# Patient Record
Sex: Female | Born: 1985 | Race: Black or African American | Hispanic: No | Marital: Single | Smoking: Current every day smoker
Health system: Southern US, Community
[De-identification: ages and names within clinical notes are randomized; demographics above are authoritative.]

## PROBLEM LIST (undated history)

## (undated) DIAGNOSIS — I10 Essential (primary) hypertension: Secondary | ICD-10-CM

## (undated) DIAGNOSIS — Z87820 Personal history of traumatic brain injury: Secondary | ICD-10-CM

## (undated) DIAGNOSIS — F32A Depression, unspecified: Secondary | ICD-10-CM

## (undated) DIAGNOSIS — F1420 Cocaine dependence, uncomplicated: Secondary | ICD-10-CM

## (undated) DIAGNOSIS — F1220 Cannabis dependence, uncomplicated: Secondary | ICD-10-CM

## (undated) DIAGNOSIS — F329 Major depressive disorder, single episode, unspecified: Secondary | ICD-10-CM

## (undated) DIAGNOSIS — F1010 Alcohol abuse, uncomplicated: Secondary | ICD-10-CM

## (undated) DIAGNOSIS — A749 Chlamydial infection, unspecified: Secondary | ICD-10-CM

---
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
## 2004-11-27 ENCOUNTER — Inpatient Hospital Stay (HOSPITAL_COMMUNITY): Admission: AD | Admit: 2004-11-27 | Discharge: 2004-11-27 | Payer: Self-pay | Admitting: *Deleted

## 2004-12-12 ENCOUNTER — Ambulatory Visit (HOSPITAL_COMMUNITY): Admission: RE | Admit: 2004-12-12 | Discharge: 2004-12-12 | Payer: Self-pay | Admitting: Family Medicine

## 2004-12-12 ENCOUNTER — Ambulatory Visit: Payer: Self-pay | Admitting: Family Medicine

## 2004-12-19 ENCOUNTER — Ambulatory Visit: Payer: Self-pay | Admitting: Family Medicine

## 2004-12-19 ENCOUNTER — Other Ambulatory Visit: Admission: RE | Admit: 2004-12-19 | Discharge: 2004-12-19 | Payer: Self-pay | Admitting: Family Medicine

## 2005-02-08 ENCOUNTER — Ambulatory Visit: Payer: Self-pay | Admitting: Obstetrics and Gynecology

## 2005-02-12 ENCOUNTER — Ambulatory Visit (HOSPITAL_COMMUNITY): Admission: RE | Admit: 2005-02-12 | Discharge: 2005-02-12 | Payer: Self-pay | Admitting: *Deleted

## 2005-02-22 ENCOUNTER — Ambulatory Visit: Payer: Self-pay | Admitting: Family Medicine

## 2005-06-04 HISTORY — PX: WISDOM TOOTH EXTRACTION: SHX21

## 2007-06-06 ENCOUNTER — Emergency Department (HOSPITAL_COMMUNITY): Admission: EM | Admit: 2007-06-06 | Discharge: 2007-06-06 | Payer: Self-pay | Admitting: Emergency Medicine

## 2008-02-10 ENCOUNTER — Emergency Department (HOSPITAL_COMMUNITY): Admission: EM | Admit: 2008-02-10 | Discharge: 2008-02-10 | Payer: Self-pay | Admitting: Emergency Medicine

## 2009-02-23 ENCOUNTER — Emergency Department (HOSPITAL_COMMUNITY): Admission: EM | Admit: 2009-02-23 | Discharge: 2009-02-23 | Payer: Self-pay | Admitting: Emergency Medicine

## 2010-01-17 ENCOUNTER — Emergency Department (HOSPITAL_COMMUNITY): Admission: EM | Admit: 2010-01-17 | Discharge: 2010-01-17 | Payer: Self-pay | Admitting: Emergency Medicine

## 2010-05-09 ENCOUNTER — Emergency Department (HOSPITAL_COMMUNITY)
Admission: EM | Admit: 2010-05-09 | Discharge: 2010-05-09 | Payer: Self-pay | Source: Home / Self Care | Admitting: Emergency Medicine

## 2010-05-14 ENCOUNTER — Emergency Department (HOSPITAL_COMMUNITY)
Admission: EM | Admit: 2010-05-14 | Discharge: 2010-05-14 | Payer: Self-pay | Source: Home / Self Care | Admitting: Emergency Medicine

## 2010-05-15 ENCOUNTER — Observation Stay (HOSPITAL_COMMUNITY)
Admission: EM | Admit: 2010-05-15 | Discharge: 2010-05-15 | Payer: Self-pay | Source: Home / Self Care | Admitting: Emergency Medicine

## 2010-06-24 ENCOUNTER — Encounter: Payer: Self-pay | Admitting: *Deleted

## 2010-08-15 LAB — URINALYSIS, ROUTINE W REFLEX MICROSCOPIC
Glucose, UA: NEGATIVE mg/dL
Ketones, ur: NEGATIVE mg/dL
Nitrite: POSITIVE — AB
Protein, ur: 100 mg/dL — AB
Specific Gravity, Urine: 1.009 (ref 1.005–1.030)
pH: 6.5 (ref 5.0–8.0)

## 2010-08-15 LAB — COMPREHENSIVE METABOLIC PANEL
ALT: 11 U/L (ref 0–35)
Albumin: 3.5 g/dL (ref 3.5–5.2)
CO2: 22 mEq/L (ref 19–32)
Chloride: 104 mEq/L (ref 96–112)
GFR calc Af Amer: >60 mL/min (ref >60)
GFR calc non Af Amer: >60 mL/min (ref >60)
Total Bilirubin: 1.4 mg/dL — ABNORMAL HIGH (ref 0.3–1.2)
Total Protein: 6.6 g/dL (ref 6.0–8.3)

## 2010-08-15 LAB — DIFFERENTIAL
Basophils Absolute: 0 10*3/uL (ref 0.0–0.1)
Eosinophils Absolute: 0 10*3/uL (ref 0.0–0.7)
Lymphocytes Relative: 12 % (ref 12–46)
Lymphs Abs: 1.1 10*3/uL (ref 0.7–4.0)
Monocytes Absolute: 1 10*3/uL (ref 0.1–1.0)
Neutro Abs: 7 10*3/uL (ref 1.7–7.7)

## 2010-08-15 LAB — WET PREP, GENITAL: Yeast Wet Prep HPF POC: NONE SEEN

## 2010-08-15 LAB — CBC
MCV: 94.4 fL (ref 78.0–100.0)
RBC: 4.25 MIL/uL (ref 3.87–5.11)
WBC: 9.2 10*3/uL (ref 4.0–10.5)

## 2010-08-15 LAB — GC/CHLAMYDIA PROBE AMP, GENITAL
Chlamydia, DNA Probe: NEGATIVE
GC Probe Amp, Genital: NEGATIVE

## 2010-08-15 LAB — URINE MICROSCOPIC-ADD ON

## 2010-08-18 LAB — GC/CHLAMYDIA PROBE AMP, GENITAL: Chlamydia, DNA Probe: NEGATIVE

## 2010-08-18 LAB — WET PREP, GENITAL: Trich, Wet Prep: NONE SEEN

## 2010-09-08 LAB — POCT PREGNANCY, URINE: Preg Test, Ur: NEGATIVE

## 2010-10-20 NOTE — Group Therapy Note (Signed)
NAME:  Tiffany [name], Tiffany [name] NO.:  [id number]   MEDICAL RECORD NO.:  [id number]          PATIENT TYPE:  WOC   LOCATION:  [facility]                   FACILITY:  [facility]   PHYSICIAN:  Tinnie Gens, MD        DATE OF BIRTH:  [birth year]   DATE OF SERVICE:  02/22/2005                                    CLINIC NOTE   CHIEF COMPLAINT:  Discussed options __________ .   HISTORY OF PRESENT ILLNESS:  The patient is a 25 year old, Gravida 1, Para 1  who was first seen at the end of this month for abnormal bleeding on Depo-  Provera.  Since that time has been started on a Nuvo ring.  Her bleeding has  much improved. The patient has had a previous ultrasound that showed a  bilateral ovarian cyst.  We had a followup with that was done on September  11.  The patient's ultrasound showed normal uterus and endometrium.  There  was persistent area of focal increased echogenicity in the right ovary,  compatible with a small dermoid that was unchanged since her previous  ultrasound dated July 11.  The previous dominant cyst of the right ovary had  resolved and a new unilocular cyst on the left was noted consistent with a  hemorrhagic cyst and further followup was felt to be unnecessary.   PHYSICAL EXAMINATION:  VITAL SIGNS:  On examine, today, her vitals were in  the chart.  GENERAL:  She is a well-developed, well-nourished white female in no acute  distress.  ABDOMEN:  Soft, and nontender, nondistended.   IMPRESSION:  1.  Abnormal bleeding, previously on Depo and treated with Nuvo ring.  The      patient will continue this method of birth control for the next 2      months.  2.  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 It is felt that this would be very difficult      surgery to try to attempt and try to locate the exact location of this      dermoid to remove.  The patient has complaints only at the time of her      cycle  which is unlikely related to the cyst.   PLAN:  Will followup pelvic ultrasound for reevaluation of the cyst in  approximately 3 months; and the patient will followup here after that time.  She will be evaluated for the cyst, her pain, as well as the Nuvo ring at  that visit.           ______________________________  Tinnie Gens, MD     TP/MEDQ  D:  02/22/2005  T:  02/26/2005  Job:  [id number]   cc:   [facility]  [address]  [place]   [place]

## 2010-10-20 NOTE — Group Therapy Note (Signed)
NAME:  Tiffany [name], ORLOV NO.:  [id number]   MEDICAL RECORD NO.:  [id number]          PATIENT TYPE:  WOC   LOCATION:  [facility]                   FACILITY:  [facility]   PHYSICIAN:  Tinnie Gens, MD        DATE OF BIRTH:  01-18-1986   DATE OF SERVICE:  02/08/2005                                    CLINIC NOTE   CHIEF COMPLAINT:  Birth control consult.   HISTORY OF PRESENT ILLNESS:  The patient is a 25 year old gravida 1 para 1  who is sent from [facility]. She apparently was seen there with some  abnormal uterine bleeding related to Depo-Provera use. She has been on Depo-  Provera since for the last 5 years and states she has had abnormal uterine  bleeding since she started on it. She has continued on it because she likes  being on the Depo-Provera; however, they have not given her any from  [facility] since January 2006. She does report a normal cycle for her in  August 2006. She does have some heavy cramping as well as some pain in her  leg when she gets her menstrual flow.   PAST MEDICAL HISTORY:  Negative.   PAST SURGICAL HISTORY:  Negative.   MEDICATIONS:  None.   ALLERGIES:  None known.   OBSTETRICAL HISTORY:  G1 P1, one vaginal delivery.   FAMILY HISTORY:  Diabetes and hypertension.   SOCIAL HISTORY:  She lives with her child, her mom, and herself. She does  smoke one-and-a-half packs per day. She denies IV drug use and no alcohol  use.   REVIEW OF SYSTEMS:  A 14-point review of systems reviewed; please see GYN  history in the chart, but is diffusely positive. However, most of the  problems she is having are followed by [facility]. She does have some  vaginal odor, vaginal itching, vaginal bleeding, pain with intercourse -  most of that was related to her Depo use.   PHYSICAL EXAMINATION TODAY:  VITAL SIGNS:  As noted in the chart.  GENERAL:  She is a thin black female in no acute distress.  ABDOMEN:  Soft, nontender, nondistended.  GENITOURINARY:   Normal external female genitalia. The vagina is pink and  rugated. The cervix is parous and without lesion. The uterus is small,  anteverted. There is no adnexal mass or tenderness.   IMPRESSION:  Abnormal uterine bleeding related to Depo-Provera use. This is  not uncommon, as there is pretty much an unopposed progesterone effect and  no estrogen to pump up the endometrial lining. A good treatment for this  would be an estrogen patch or the pill. However, the patient does not need  to be on two consecutive methods of birth control. Discussed multiple  alternatives with this patient, including the pill, the patch, the ring,  IUD, and continued Depo use, as well as continued condom use. The patient  understands all this and she opts to receive a sample of the NuvaRing. The  patient is given the NuvaRing today in a sample and a discussion was had  with her regarding Medicaid family planning waiver which will  cover her Pap  smear as well as her birth control. The patient will find out more  information about this. The patient instructed to start the ring after she  starts her next cycle. There is no significant risk to a smoker using  hormonal contraceptives at the age of 27.   The patient will have a follow up pelvic ultrasound because she had a right  ovarian cyst that needed follow-up from December 12, 2004.           ______________________________  Tinnie Gens, MD     TP/MEDQ  D:  02/08/2005  T:  02/09/2005  Job:  [id number]

## 2010-11-21 ENCOUNTER — Emergency Department (HOSPITAL_COMMUNITY)
Admission: EM | Admit: 2010-11-21 | Discharge: 2010-11-22 | Disposition: A | Discharge status: Other Acute Inpatient Hospital | Payer: Medicaid Other | Source: Home / Self Care | Attending: Emergency Medicine | Admitting: Emergency Medicine

## 2010-11-21 DIAGNOSIS — F329 Major depressive disorder, single episode, unspecified: Secondary | ICD-10-CM | POA: Insufficient documentation

## 2010-11-21 DIAGNOSIS — R45851 Suicidal ideations: Secondary | ICD-10-CM | POA: Insufficient documentation

## 2010-11-21 DIAGNOSIS — F39 Unspecified mood [affective] disorder: Secondary | ICD-10-CM

## 2010-11-21 DIAGNOSIS — F3289 Other specified depressive episodes: Secondary | ICD-10-CM | POA: Insufficient documentation

## 2010-11-21 LAB — DIFFERENTIAL
Basophils Relative: 1 % (ref 0–1)
Eosinophils Absolute: 0.1 10*3/uL (ref 0.0–0.7)
Eosinophils Relative: 2 % (ref 0–5)
Lymphocytes Relative: 44 % (ref 12–46)
Monocytes Relative: 9 % (ref 3–12)
Neutro Abs: 2.9 10*3/uL (ref 1.7–7.7)

## 2010-11-21 LAB — RAPID URINE DRUG SCREEN, HOSP PERFORMED
Amphetamines: NOT DETECTED
Barbiturates: NOT DETECTED
Cocaine: POSITIVE — AB
Opiates: NOT DETECTED
Tetrahydrocannabinol: POSITIVE — AB

## 2010-11-21 LAB — URINALYSIS, ROUTINE W REFLEX MICROSCOPIC
Bilirubin Urine: NEGATIVE
Glucose, UA: NEGATIVE mg/dL
Hgb urine dipstick: NEGATIVE
Ketones, ur: NEGATIVE mg/dL
Nitrite: NEGATIVE
Urobilinogen, UA: 1 mg/dL (ref 0.0–1.0)
pH: 6.5 (ref 5.0–8.0)

## 2010-11-21 LAB — ETHANOL: Alcohol, Ethyl (B): <11 mg/dL (ref 0–11)

## 2010-11-21 LAB — URINE MICROSCOPIC-ADD ON

## 2010-11-21 LAB — BASIC METABOLIC PANEL
BUN: 14 mg/dL (ref 6–23)
CO2: 27 mEq/L (ref 19–32)
Potassium: 4 mEq/L (ref 3.5–5.1)

## 2010-11-21 LAB — CBC
Hemoglobin: 12.8 g/dL (ref 12.0–15.0)
MCHC: 33.2 g/dL (ref 30.0–36.0)
Platelets: 268 10*3/uL (ref 150–400)

## 2010-11-21 LAB — POCT PREGNANCY, URINE: Preg Test, Ur: NEGATIVE

## 2010-11-22 ENCOUNTER — Inpatient Hospital Stay (HOSPITAL_COMMUNITY)
Admission: AD | Admit: 2010-11-22 | Discharge: 2010-11-29 | DRG: 885 | Disposition: A | Discharge status: Home / Self Care | Payer: Medicaid Other | Source: Ambulatory Visit | Attending: Psychiatry | Admitting: Psychiatry

## 2010-11-22 DIAGNOSIS — N390 Urinary tract infection, site not specified: Secondary | ICD-10-CM

## 2010-11-22 DIAGNOSIS — Z818 Family history of other mental and behavioral disorders: Secondary | ICD-10-CM

## 2010-11-22 DIAGNOSIS — F1010 Alcohol abuse, uncomplicated: Secondary | ICD-10-CM

## 2010-11-22 DIAGNOSIS — A4902 Methicillin resistant Staphylococcus aureus infection, unspecified site: Secondary | ICD-10-CM

## 2010-11-22 DIAGNOSIS — L089 Local infection of the skin and subcutaneous tissue, unspecified: Secondary | ICD-10-CM

## 2010-11-22 DIAGNOSIS — Z6379 Other stressful life events affecting family and household: Secondary | ICD-10-CM

## 2010-11-22 DIAGNOSIS — Z560 Unemployment, unspecified: Secondary | ICD-10-CM

## 2010-11-22 DIAGNOSIS — F1910 Other psychoactive substance abuse, uncomplicated: Secondary | ICD-10-CM

## 2010-11-22 DIAGNOSIS — F331 Major depressive disorder, recurrent, moderate: Principal | ICD-10-CM

## 2010-11-22 DIAGNOSIS — F17200 Nicotine dependence, unspecified, uncomplicated: Secondary | ICD-10-CM

## 2010-11-23 DIAGNOSIS — F1010 Alcohol abuse, uncomplicated: Secondary | ICD-10-CM

## 2010-11-23 DIAGNOSIS — F339 Major depressive disorder, recurrent, unspecified: Secondary | ICD-10-CM

## 2010-11-23 DIAGNOSIS — F1210 Cannabis abuse, uncomplicated: Secondary | ICD-10-CM

## 2010-11-23 DIAGNOSIS — F1410 Cocaine abuse, uncomplicated: Secondary | ICD-10-CM

## 2010-11-23 NOTE — H&P (Signed)
NAME:  Tiffany [name], Tiffany [name] NO.:  [id number]  MEDICAL RECORD NO.:  [id number]  LOCATION:  0502                          [facility]ILITY:  [facility]  PHYSICIAN:  Franchot Gallo, MD     DATE OF BIRTH:  11-10-1985  DATE OF ADMISSION:  11/22/2010 DATE OF DISCHARGE:                      PSYCHIATRIC ADMISSION ASSESSMENT   CHIEF COMPLAINT:  "I was having thoughts of hurting myself as well as my landlord."  HISTORY OF PRESENT ILLNESS:  Tiffany [name] is a 25 year old, single black female who was admitted to Behavioral Health for evaluation of depressive symptoms as well as recent thoughts of harming herself and her landlord.  The patient states that she currently lives in [address] 8 housing and in February of this year, she began to have difficulty with "roaches."  She states that Child Protective Services was contacted secondary to the roaches in her apartment, and her 38-year-old daughter was temporarily removed and located at her mother's until her apartment could be cleaned of the roaches.  She states that she reported issues with roaches to the housing authority in February of 2012, but as of now, they have done nothing about it.  She reports that she attempted on several occasions to complain about the roaches and on one occasion, the housing authority granted permission for her to transfer to another home.  However, she states that later, the housing authority "terminated her lease" when they found that she had taken down a smoke detector which was infested with roaches.  The patient states that as a result of this, she has no place to live and will have difficulty regaining custody of her child.  The patient states that she began to experience depressive symptoms related to the above as well as thoughts of harming the landlord at the housing authority.  She was seen by her legal aide attorneys yesterday who felt it would be best for her to be admitted for evaluation.  Currently,  the patient states that she is having difficulty initiating and maintaining sleep.  She reports a good appetite and reports moderate feelings of sadness, anhedonia and depressed mood.  Currently, she adamantly denies any suicidal thoughts, stating she would never attempt to harm herself secondary to having to care for her daughter.  She does state that she did have some previous thoughts of hurting the director the housing authority but denies any thoughts of harming anyone today. She states that she had these thoughts secondary to the initial anger of being told she would have to leave her apartment.  The patient also denies any past attempts at harming herself or others.  The patient denies any past or current manic or hypomanic symptoms.  She also denies any past or current auditory or visual hallucinations or delusional thinking.  She states that her anxiety level today is moderate but feels this is related to the unknowns of where she will live and when she can regain custody of her daughter.  The patient does report occasionally using alcohol as well as occasional use of cannabis, powder cocaine and crack.  She also reports smoking 1/2 pack of cigarettes per day.  The patient will be admitted for evaluation and treatment of the above  symptoms.  PAST PSYCHIATRIC HISTORY:  The patient reports one past psychiatric hospitalization at the age of 30 years in a facility in [place], [place]. She states this occurred after a fight with her mother.  She reports that she was most recently seen at the "[facility]" in [place], [place], where she has been seen on one occasion and placed on medications. The patient reports that she remembers two of the medications being trazodone and Abilify but cannot recall the third.  She also cannot recall the doses of her medications.  PAST MEDICAL HISTORY:   Current medications: 1. Trazodone - Dosage unknown. 2. Abilify - Dosage unknown. 3.  Antidepressant - Name and dosage unknown.  Allergies:  NKDA.  Medical illnesses: 1. Current UTI. 2. Current infection to left knee.  Past operations:  None reported.  FAMILY HISTORY:  The patient states that her maternal aunt has a history of depression as well as drug abuse.  She also reports that a maternal cousin has a history of schizophrenia as well as suicidal and homicidal ideations.  SOCIAL HISTORY:  The patient states that she was born and raised in [place], [place], and currently lives in [place] with "two roommates." She states that she is single and has never been married and has one child who is 27 years of age who lives with her maternal grandmother. The patient reports to completing high school as well as 1 year of college but is currently unemployed.  Again, she reports smoking 1/2 pack of cigarettes per day as well as occasional use of alcohol, cannabis, powder cocaine and crack cocaine.  MENTAL STATUS EXAM:  GENERAL - The patient was alert and oriented x3 and was friendly and cooperative with this Environmental consultant.  The patient, however, was depressed in appearance and answered questions with her head down. Speech was appropriate in terms of rate and volume with little spontaneous speech.  Mood appeared moderately to severely depressed, although the patient reported her depression as moderate.  Affect was moderately constricted. THOUGHTS - The patient denied any auditory or visual hallucinations or delusional thinking.  She also denied any suicidal ideations or any current homicidal ideations.  Judgment and insight today both appeared fair to good.  IMPRESSION:   Axis I: 1. Major depressive disorder - Recurrent - Moderate. 2. Polysubstance abuse including alcohol, cannabis, powdered cocaine     and crack cocaine. 3. Nicotine dependence. Axis II:  Deferred. Axis III:  Please see past medical history above. Axis IV:  Limited primary support system.  Unemployment.   Financial constraints.  Difficulty with safe permanent housing. Axis V:  Global Assessment of Functioning at time of admission approximately 45.  Highest Global Assessment of Functioning in past year approximately 55.  PLAN: 1. The patient was started on the medication Celexa at 20 mg p.o.     q.a.m. to begin to treat her depressive symptoms. 2. The patient was started on Abilify at 2 mg p.o. q. 10 p.m. to     augment her antidepressant.  This can also act as a mood     stabilizer. 3. The patient was restarted on trazodone at 50 mg p.o. q.h.s. for     sleep. 4. An HIV test was ordered as well as a DNA probe for chlamydia and     gonorrhea at the patient's request. 5. The patient will be closely monitored for dangerousness to self     and/or others. 6. The patient will participate in group and unit activities per  routine.    __________________________________ Franchot Gallo, MD     RR/MEDQ  D:  11/23/2010  T:  11/23/2010  Job:  [id number]  Electronically Signed by Franchot Gallo MD on 11/23/2010 05:42:11 PM

## 2010-11-24 ENCOUNTER — Ambulatory Visit (HOSPITAL_COMMUNITY)
Admission: AD | Admit: 2010-11-24 | Discharge: 2010-11-24 | Disposition: A | Discharge status: Home / Self Care | Payer: Medicaid Other | Source: Ambulatory Visit | Attending: Emergency Medicine | Admitting: Emergency Medicine

## 2010-11-24 DIAGNOSIS — F17200 Nicotine dependence, unspecified, uncomplicated: Secondary | ICD-10-CM | POA: Insufficient documentation

## 2010-11-24 DIAGNOSIS — L02419 Cutaneous abscess of limb, unspecified: Secondary | ICD-10-CM | POA: Insufficient documentation

## 2010-11-26 LAB — CULTURE, ROUTINE-ABSCESS: Gram Stain: NONE SEEN

## 2010-11-29 ENCOUNTER — Emergency Department (HOSPITAL_COMMUNITY)
Admission: EM | Admit: 2010-11-29 | Discharge: 2010-11-29 | Disposition: A | Discharge status: Home / Self Care | Payer: Medicaid Other | Source: Home / Self Care | Attending: Emergency Medicine | Admitting: Emergency Medicine

## 2010-11-29 DIAGNOSIS — L03119 Cellulitis of unspecified part of limb: Secondary | ICD-10-CM | POA: Insufficient documentation

## 2010-11-29 DIAGNOSIS — L02419 Cutaneous abscess of limb, unspecified: Secondary | ICD-10-CM | POA: Insufficient documentation

## 2010-11-29 DIAGNOSIS — M25569 Pain in unspecified knee: Secondary | ICD-10-CM | POA: Insufficient documentation

## 2010-11-29 NOTE — Discharge Summary (Signed)
NAME:  Tiffany, [name] NO.:  [id number]  MEDICAL RECORD NO.:  [id number]  LOCATION:  0502                          [facility]ILITY[facility]  PHYSICIAN:  Franchot Gallo, MD     DATE OF BIRTH:  [birth year]  DATE OF ADMISSION:  11/22/2010 DATE OF DISCHARGE:  11/29/2010                              DISCHARGE SUMMARY   Tiffany [name] is a 25 year old, single black female who was admitted to [facility] on November 22, 2010, for evaluation of depressive symptoms as well as recent thoughts of harming herself as well as her landlord.  The patient, at that time, stated that she was living in Section 8 housing and in February of this year began to have difficulty with roaches in her home.  She states that Child Protective Services was contacted secondary to the roaches, and her daughter was removed and placed with the patient's mother until more appropriate housing could be found.  The patient states that she continued to voice concerns about her living situation with the housing authority and then found  that her lease was being terminated due to her "taking down a smoke detector." The patient stated that she took down the smoke detector secondary to it being infested with roaches.  She states that with her lease being terminated, she no longer has a place to live with her child and this resulted in depressive symptoms as well as thoughts of harming her landlord.  The patient was initially seen by this physician on November 23, 2010, and was placed on the medication Celexa at 20 mg p.o. q.a.m. to treat her depressive symptoms as well as Abilify 2 mg p.o. q. 10 p.m. to augment her antidepressant and to provide mood stabilization.  She was also given trazodone 50 mg p.o. q.h.s. for sleep.  The patient was next seen by this physician on November 24, 2010, and reported that she was continuing to have difficulty initiating and maintaining sleep.  She reported a good appetite but continued to  report severe feelings of sadness, anhedonia and depressed mood.  She denied any suicidal or homicidal ideations or any auditory or visual hallucinations or delusional thinking.  She reported left knee pain and on examination, it was found that her knee was swollen.  The patient's medication trazodone was increased to 100 mg p.o. q.h.s. for sleep, and she was sent to the emergency department for her left knee to be evaluated.  While in the emergency department, the knee was incised and drained, and the patient was found to have a MRSA infection of her knee. She was placed on doxycycline for this since the patient's culture and sensitivity showed that the infection was sensitive to this antibiotic.  On November 25, 2010, the patient was seen by the on-call provider and stated that she did not sleep well.  She denied any psychosis and stated that her knee was feeling better.  The patient's medication trazodone was increased to 150 mg p.o. q.h.s.  The patient was next seen by the on-call provider on November 26, 2010, and stated that she was now sleeping well and reported a good appetite.  She denied any side effects to her medications  and stated that her knee was feeling better.  The patient was next seen by this physician on November 27, 2010, and reported sleeping well without difficulty and reported a good appetite. She stated that her depressive symptoms had resolved and on a scale of 1- 10, she rated her depression as a 0.  She denied any suicidal or homicidal ideations as well as any auditory or visual hallucinations or delusional thinking.  She also stated that her anxiety symptoms were under good control, and she denied any medication-related side effects. It was discussed with the patient discharge planning, and the patient was interested in entering [facility] on November 29, 2010, to further address her substance abuse-related issues.  The patient was next seen on November 28, 2010, and again  stated that she was sleeping well without difficulty and reported a good appetite.  The patient again stated that her depressive symptoms had resolved, and she denied any suicidal or homicidal ideations.  The patient stated that her anxiety symptoms were "mild" and rated this as a 3 on a scale of 1-10. She stated that she was worried about her discharge the following day to [facility].  The patient's medication Celexa was increased to 5 mg p.o. q.a.m. to address her anxiety symptoms.  The patient was seen the day of discharge and reported that she was feeling well with no concerns.  She stated that she was sleeping well without difficulty and reported a good appetite and again rated her depression on a scale of 1-10 as a 0, stating that she had no depressive symptoms.  She denied any suicidal or homicidal ideations.  She also denied any auditory or visual hallucinations or delusional thinking. Her anxiety remained mild with her worrying about her discharge to [facility].  The patient was discharged to [facility] for further treatment of her substance-abuse issues.  Later in the day, we received a call from [facility] stating that they had decided not to admit [name] secondary to her MRSA infection.  The patient was referred to [facility], where she will be seen tomorrow for further evaluation and treatment of her substance abuse-related issues.  PERTINENT LABORATORY DATA:  As stated above, the patient was found to have a MRSA infection on her left knee.  A culture and sensitivity performed on November 24, 2010, confirmed MRSA and also confirmed that the infection was sensitive to tetracycline.  Of note, the patient's HIV test was nonreactive as well as her GC and chlamydia probe.  FINAL DIAGNOSES:   Axis I: 1. Major depressive disorder - Recurrent - Under good control at time     of discharge. 2. Polysubstance abuse including alcohol, cannabis, powder cocaine and     crack cocaine - In  remission. 3. Nicotine dependence. Axis II:  Deferred. Axis III:  Methicillin-resistant Staphylococcus aureus infection of left knee. Axis IV:  Limited primary support system.  Unemployment.  Financial constraints.  Difficulty with safe permanent housing. Axis V:  Global Assessment of Functioning at time of admission approximately 45.  Global Assessment of Functioning at time of discharge approximately 65.  PROCEDURES PERFORMED AND TREATMENT RENDERED DURING HOSPITALIZATION:  The patient was provided with psychiatric medications for depression and anxiety and also participated in group and unit activities.  The patient also was seen in the emergency department for I and D of her left knee infection.  DISCHARGE MEDICATIONS: 1. Celexa 30 mg p.o. q.a.m. for depression and anxiety. 2. Abilify 2 mg p.o. q.h.s. for mood stabilization. 3. Doxycycline 100  mg p.o. b.i.d. until December 02, 2010, to address her    MRSA infection. 4. Trazodone 150 mg p.o. q.h.s. p.r.n. for sleep.  CONDITION AT TIME OF DISCHARGE:  The patient was alert and oriented x3 and was friendly and cooperative with this provider.  Speech was appropriate in terms of rate and volume.  Mood appeared euthymic. Affect was bright and full. THOUGHTS - The patient denied any delusions or hallucinations.  She also denied any suicidal or homicidal ideations.  Judgment and insight today both appeared fair to good.  The patient was experiencing no symptoms of substance withdrawal or any medication-related side effects.  FOLLOWUP APPOINTMENTS:  As stated above, the patient will follow up with local community mental health, [facility], on November 30, 2010, between 8:00 a.m. and 11:00 p.m.    ___________________________________ Franchot Gallo, MD     RR/MEDQ  D:  11/29/2010  T:  11/29/2010  Job:  [id number]  Electronically Signed by Franchot Gallo MD on 11/29/2010 04:22:12 PM

## 2010-12-03 LAB — CULTURE, ROUTINE-ABSCESS

## 2011-01-19 ENCOUNTER — Emergency Department (HOSPITAL_COMMUNITY)
Admission: EM | Admit: 2011-01-19 | Discharge: 2011-01-19 | Disposition: A | Discharge status: Home / Self Care | Payer: Medicaid Other | Attending: Emergency Medicine | Admitting: Emergency Medicine

## 2011-01-19 ENCOUNTER — Emergency Department (HOSPITAL_COMMUNITY): Payer: Medicaid Other

## 2011-01-19 DIAGNOSIS — S0510XA Contusion of eyeball and orbital tissues, unspecified eye, initial encounter: Secondary | ICD-10-CM | POA: Insufficient documentation

## 2011-01-19 DIAGNOSIS — F341 Dysthymic disorder: Secondary | ICD-10-CM | POA: Insufficient documentation

## 2011-01-19 DIAGNOSIS — R4182 Altered mental status, unspecified: Secondary | ICD-10-CM | POA: Insufficient documentation

## 2011-01-19 DIAGNOSIS — S61409A Unspecified open wound of unspecified hand, initial encounter: Secondary | ICD-10-CM | POA: Insufficient documentation

## 2011-01-19 DIAGNOSIS — Z189 Retained foreign body fragments, unspecified material: Secondary | ICD-10-CM | POA: Insufficient documentation

## 2011-01-19 DIAGNOSIS — S91309A Unspecified open wound, unspecified foot, initial encounter: Secondary | ICD-10-CM | POA: Insufficient documentation

## 2011-01-19 DIAGNOSIS — S0003XA Contusion of scalp, initial encounter: Secondary | ICD-10-CM | POA: Insufficient documentation

## 2011-01-19 DIAGNOSIS — S0083XA Contusion of other part of head, initial encounter: Secondary | ICD-10-CM | POA: Insufficient documentation

## 2011-01-19 DIAGNOSIS — N390 Urinary tract infection, site not specified: Secondary | ICD-10-CM | POA: Insufficient documentation

## 2011-01-19 DIAGNOSIS — S41109A Unspecified open wound of unspecified upper arm, initial encounter: Secondary | ICD-10-CM | POA: Insufficient documentation

## 2011-01-19 LAB — RAPID URINE DRUG SCREEN, HOSP PERFORMED
Amphetamines: NOT DETECTED
Barbiturates: NOT DETECTED
Benzodiazepines: NOT DETECTED
Cocaine: POSITIVE — AB
Opiates: NOT DETECTED
Tetrahydrocannabinol: POSITIVE — AB

## 2011-01-19 LAB — URINALYSIS, ROUTINE W REFLEX MICROSCOPIC
Bilirubin Urine: NEGATIVE
Glucose, UA: NEGATIVE mg/dL
Hgb urine dipstick: NEGATIVE
Ketones, ur: NEGATIVE mg/dL
Nitrite: POSITIVE — AB
Protein, ur: 30 mg/dL — AB
Specific Gravity, Urine: 1.025 (ref 1.005–1.030)
Urobilinogen, UA: 0.2 mg/dL (ref 0.0–1.0)
pH: 5.5 (ref 5.0–8.0)

## 2011-01-19 LAB — COMPREHENSIVE METABOLIC PANEL
Albumin: 3.7 g/dL (ref 3.5–5.2)
BUN: 25 mg/dL — ABNORMAL HIGH (ref 6–23)
CO2: 25 mEq/L (ref 19–32)
Chloride: 104 mEq/L (ref 96–112)
Creatinine, Ser: 0.91 mg/dL (ref 0.50–1.10)
GFR calc Af Amer: >60 mL/min (ref >60)
GFR calc non Af Amer: >60 mL/min (ref >60)
Glucose, Bld: 87 mg/dL (ref 70–99)
Total Bilirubin: 0.1 mg/dL — ABNORMAL LOW (ref 0.3–1.2)

## 2011-01-19 LAB — POCT I-STAT, CHEM 8
BUN: 25 mg/dL — ABNORMAL HIGH (ref 6–23)
Calcium, Ion: 1.16 mmol/L (ref 1.12–1.32)
Chloride: 108 meq/L (ref 96–112)
Creatinine, Ser: 1 mg/dL (ref 0.50–1.10)
Glucose, Bld: 85 mg/dL (ref 70–99)
HCT: 42 % (ref 36.0–46.0)
Hemoglobin: 14.3 g/dL (ref 12.0–15.0)
Potassium: 3.7 meq/L (ref 3.5–5.1)
Sodium: 141 meq/L (ref 135–145)
TCO2: 21 mmol/L (ref 0–100)

## 2011-01-19 LAB — URINE MICROSCOPIC-ADD ON

## 2011-01-19 LAB — COMPREHENSIVE METABOLIC PANEL WITH GFR
ALT: 11 U/L (ref 0–35)
AST: 18 U/L (ref 0–37)
Alkaline Phosphatase: 77 U/L (ref 39–117)
Calcium: 9.4 mg/dL (ref 8.4–10.5)
Potassium: 3.7 meq/L (ref 3.5–5.1)
Sodium: 140 meq/L (ref 135–145)
Total Protein: 7.1 g/dL (ref 6.0–8.3)

## 2011-01-19 LAB — CBC
HCT: 37.5 % (ref 36.0–46.0)
Hemoglobin: 12.8 g/dL (ref 12.0–15.0)
MCH: 31.2 pg (ref 26.0–34.0)
MCHC: 34.1 g/dL (ref 30.0–36.0)
MCV: 91.5 fL (ref 78.0–100.0)
Platelets: 292 10*3/uL (ref 150–400)
RBC: 4.1 MIL/uL (ref 3.87–5.11)
RDW: 14.2 % (ref 11.5–15.5)
WBC: 8.6 10*3/uL (ref 4.0–10.5)

## 2011-01-19 LAB — PROTIME-INR
INR: 0.93 (ref 0.00–1.49)
Prothrombin Time: 12.7 s (ref 11.6–15.2)

## 2011-01-19 LAB — ETHANOL: Alcohol, Ethyl (B): <11 mg/dL (ref 0–11)

## 2011-01-19 LAB — LACTIC ACID, PLASMA: Lactic Acid, Venous: 3.2 mmol/L — ABNORMAL HIGH (ref 0.5–2.2)

## 2011-01-19 LAB — POCT PREGNANCY, URINE: Preg Test, Ur: NEGATIVE

## 2011-03-07 LAB — URINALYSIS, ROUTINE W REFLEX MICROSCOPIC
Bilirubin Urine: NEGATIVE
Hgb urine dipstick: NEGATIVE
Ketones, ur: NEGATIVE
Nitrite: NEGATIVE
Urobilinogen, UA: 1

## 2011-03-07 LAB — WET PREP, GENITAL: Trich, Wet Prep: NONE SEEN

## 2011-03-07 LAB — GC/CHLAMYDIA PROBE AMP, GENITAL
Chlamydia, DNA Probe: NEGATIVE
GC Probe Amp, Genital: NEGATIVE

## 2011-05-13 ENCOUNTER — Encounter: Payer: Self-pay | Admitting: Emergency Medicine

## 2011-05-13 ENCOUNTER — Emergency Department (HOSPITAL_COMMUNITY)
Admission: EM | Admit: 2011-05-13 | Discharge: 2011-05-13 | Disposition: A | Discharge status: Home / Self Care | Payer: Self-pay | Attending: Emergency Medicine | Admitting: Emergency Medicine

## 2011-05-13 DIAGNOSIS — J111 Influenza due to unidentified influenza virus with other respiratory manifestations: Secondary | ICD-10-CM | POA: Insufficient documentation

## 2011-05-13 DIAGNOSIS — N390 Urinary tract infection, site not specified: Secondary | ICD-10-CM | POA: Insufficient documentation

## 2011-05-13 DIAGNOSIS — F17200 Nicotine dependence, unspecified, uncomplicated: Secondary | ICD-10-CM | POA: Insufficient documentation

## 2011-05-13 HISTORY — DX: Personal history of traumatic brain injury: Z87.820

## 2011-05-13 LAB — URINALYSIS, ROUTINE W REFLEX MICROSCOPIC
Ketones, ur: NEGATIVE mg/dL
Protein, ur: NEGATIVE mg/dL
Urobilinogen, UA: 1 mg/dL (ref 0.0–1.0)

## 2011-05-13 LAB — URINE MICROSCOPIC-ADD ON

## 2011-05-13 LAB — POCT PREGNANCY, URINE: Preg Test, Ur: NEGATIVE

## 2011-05-13 MED ORDER — ACETAMINOPHEN 325 MG PO TABS
ORAL_TABLET | ORAL | Status: AC
  Administered 2011-05-13: 650 mg via ORAL
  Filled 2011-05-13: qty 2

## 2011-05-13 MED ORDER — ALBUTEROL SULFATE HFA 108 (90 BASE) MCG/ACT IN AERS: 2.0000 | INHALATION_SPRAY | RESPIRATORY_TRACT | Status: DC | PRN

## 2011-05-13 MED ORDER — ALBUTEROL SULFATE (5 MG/ML) 0.5% IN NEBU
2.5000 mg | INHALATION_SOLUTION | Freq: Once | RESPIRATORY_TRACT | Status: AC
  Administered 2011-05-13: 2.5 mg via RESPIRATORY_TRACT
  Filled 2011-05-13: qty 0.5

## 2011-05-13 MED ORDER — ACETAMINOPHEN 325 MG PO TABS
650.0000 mg | ORAL_TABLET | Freq: Once | ORAL | Status: AC
  Administered 2011-05-13: 650 mg via ORAL

## 2011-05-13 MED ORDER — NITROFURANTOIN MONOHYD MACRO 100 MG PO CAPS: 100.0000 mg | ORAL_CAPSULE | Freq: Two times a day (BID) | ORAL | Status: AC

## 2011-05-13 MED ORDER — NITROFURANTOIN MONOHYD MACRO 100 MG PO CAPS
100.0000 mg | ORAL_CAPSULE | Freq: Once | ORAL | Status: AC
  Administered 2011-05-13: 100 mg via ORAL
  Filled 2011-05-13: qty 1

## 2011-05-13 NOTE — ED Provider Notes (Signed)
History     CSN: [id number] Arrival date & time: 05/13/2011  4:31 PM   First MD Initiated Contact with Patient 05/13/11 1707      Chief Complaint  Patient presents with  . Influenza    (Consider location/radiation/quality/duration/timing/severity/associated sxs/prior treatment) Patient is a 25 y.o. female presenting with flu symptoms. The history is provided by the patient.  Influenza  For the last week, she has had cough productive of greenish sputum along with fever, chills, sweats. She's also had green rhinorrhea and a sore throat. There has been nausea and she is vomited once. She has had some diarrhea. She denies arthralgias or myalgias. During this time, she is also had urinary urgency, frequency, and tenesmus. She denies dysuria and denies abdominal pain or flank pain. She has had sick contacts. She took a dose of Robitussin with no relief. Symptoms are described as moderate to severe. Nothing makes them better, nothing makes them worse.  Past Medical History  Diagnosis Date  . History of concussion     History reviewed. No pertinent past surgical history.  No family history on file.  History  Substance Use Topics  . Smoking status: Current Everyday Smoker -- 0.5 packs/day  . Smokeless tobacco: Never Used  . Alcohol Use: 2.9 oz/week    4 Cans of beer, 1 Drinks containing 0.5 oz of alcohol per week    OB History    Grav Para Term Preterm Abortions TAB SAB Ect Mult Living                  Review of Systems  All other systems reviewed and are negative.    Allergies  Review of patient's allergies indicates no known allergies.  Home Medications  No current outpatient prescriptions on file.  BP 109/65  Pulse 90  Temp(Src) 102.4 F (39.1 C) (Oral)  Ht 5\' 6"  (1.676 m)  Wt 120 lb (54.432 kg)  BMI 19.37 kg/m2  SpO2 98%  LMP 05/10/2011  Physical Exam  Nursing note and vitals reviewed.  25 year old female who is resting comfortably and in no acute distress.  She is febrile with temperature 102.4 and other vital signs are normal. Head is normocephalic and atraumatic. PERRLA, EOMI. Oropharynx is clear. Neck is supple without adenopathy. Lungs are clear of, but slight wheezing is noted when she coughs. No rales or rhonchi are present. Back is nontender and there is no CVA tenderness. Heart has regular rate and rhythm without murmur. Abdomen is soft, flat, nontender without masses or hepatosplenomegaly. Extremities have no cyanosis or edema, full range of motion is present. Skin is warm and moist slightly diaphoretic without rash. Neurologic: Mental status is normal, cranial nerves are intact, there are no motor or sensory deficits. Psychiatric: No abnormalities of mood or affect.  ED Course  Procedures (including critical care time)  Labs Reviewed - No data to display No results found.   No diagnosis found.  Patient tells me that she has a strong family history of cervical cancer, breast cancer, and cerebral aneurysms. She is advised to make sure that she gets regular Pap smears to diagnose cervical cancer had curable stages, get routine mammograms and breast exams to diagnose breast cancer had curable stages, and to discuss with PCP whether she should get an MRA to evaluate for possible cerebral aneurysms.  Urinalysis is positive for UTI. She started on Macrobid. MDM  Clinical episode of influenza. She is well outside the window where treatment with antivirals would be helpful. Also possible  UTI.        Dione Booze, MD 05/13/11 [phone]

## 2011-05-13 NOTE — ED Notes (Signed)
Sick x 1 week, head/nasal congestion, H/A, cough, sore throat, low back pain, frequency, urgency, photosensitivity

## 2011-05-13 NOTE — ED Notes (Signed)
RT aware of breathing tx.

## 2011-05-15 LAB — URINE CULTURE: Colony Count: >=100000

## 2011-05-16 NOTE — ED Notes (Signed)
+   urine Patient treated with Macrobid-sensitive to same-chart appended per protocol MD. 

## 2011-06-08 ENCOUNTER — Emergency Department (HOSPITAL_COMMUNITY)
Admission: EM | Admit: 2011-06-08 | Discharge: 2011-06-08 | Disposition: A | Discharge status: Home / Self Care | Payer: Self-pay | Attending: Emergency Medicine | Admitting: Emergency Medicine

## 2011-06-08 ENCOUNTER — Encounter (HOSPITAL_COMMUNITY): Payer: Self-pay | Admitting: *Deleted

## 2011-06-08 DIAGNOSIS — N390 Urinary tract infection, site not specified: Secondary | ICD-10-CM | POA: Insufficient documentation

## 2011-06-08 DIAGNOSIS — R300 Dysuria: Secondary | ICD-10-CM | POA: Insufficient documentation

## 2011-06-08 DIAGNOSIS — R350 Frequency of micturition: Secondary | ICD-10-CM | POA: Insufficient documentation

## 2011-06-08 DIAGNOSIS — R3915 Urgency of urination: Secondary | ICD-10-CM | POA: Insufficient documentation

## 2011-06-08 DIAGNOSIS — M549 Dorsalgia, unspecified: Secondary | ICD-10-CM | POA: Insufficient documentation

## 2011-06-08 DIAGNOSIS — R10819 Abdominal tenderness, unspecified site: Secondary | ICD-10-CM | POA: Insufficient documentation

## 2011-06-08 DIAGNOSIS — F17200 Nicotine dependence, unspecified, uncomplicated: Secondary | ICD-10-CM | POA: Insufficient documentation

## 2011-06-08 DIAGNOSIS — R109 Unspecified abdominal pain: Secondary | ICD-10-CM | POA: Insufficient documentation

## 2011-06-08 LAB — CBC
MCHC: 34.8 g/dL (ref 30.0–36.0)
Platelets: 271 10*3/uL (ref 150–400)
RDW: 14.2 % (ref 11.5–15.5)

## 2011-06-08 LAB — POCT I-STAT, CHEM 8
Glucose, Bld: 82 mg/dL (ref 70–99)
HCT: 52 % — ABNORMAL HIGH (ref 36.0–46.0)
Hemoglobin: 17.7 g/dL — ABNORMAL HIGH (ref 12.0–15.0)
Potassium: 3.8 mEq/L (ref 3.5–5.1)
Sodium: 139 mEq/L (ref 135–145)

## 2011-06-08 LAB — URINALYSIS, ROUTINE W REFLEX MICROSCOPIC
Ketones, ur: NEGATIVE mg/dL
Nitrite: POSITIVE — AB
Protein, ur: NEGATIVE mg/dL

## 2011-06-08 LAB — DIFFERENTIAL
Basophils Absolute: 0 10*3/uL (ref 0.0–0.1)
Basophils Relative: 0 % (ref 0–1)
Neutro Abs: 6.2 10*3/uL (ref 1.7–7.7)
Neutrophils Relative %: 65 % (ref 43–77)

## 2011-06-08 LAB — URINE MICROSCOPIC-ADD ON

## 2011-06-08 MED ORDER — CIPROFLOXACIN HCL 250 MG PO TABS: 250.0000 mg | ORAL_TABLET | Freq: Two times a day (BID) | ORAL | Status: AC

## 2011-06-08 MED ORDER — HYDROCODONE-ACETAMINOPHEN 5-325 MG PO TABS
1.0000 | ORAL_TABLET | Freq: Once | ORAL | Status: AC
  Administered 2011-06-08: 1 via ORAL
  Filled 2011-06-08: qty 1

## 2011-06-08 NOTE — Progress Notes (Signed)
ED PA obtaining a Urinalysis.  ED CM updated ED RN that pt would prefer to obtain medications from [facility] $4 list but if determined Presciptions are not from this list pt prefers to use indigent services.  Pending results may need to contact on call CM
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
## 2011-06-08 NOTE — ED Provider Notes (Signed)
Medical screening examination/treatment/procedure(s) were performed by non-physician practitioner and as supervising physician I was immediately available for consultation/collaboration.  Jonell Krontz, MD 06/08/11 2300 

## 2011-06-08 NOTE — Progress Notes (Signed)
Pending prescriptions from EDP Pt given copy of [facility] $4 medication list

## 2011-06-08 NOTE — Progress Notes (Signed)
Consulted by ED triage RN, Vincenza Hews for medication assistance for pt.  Cm noted in EPIC pt is self pay. Spoke to [place] in [place] [facility] who states pt is eligible for indigent medication assistance. Pending scripts from EDP

## 2011-06-08 NOTE — Progress Notes (Signed)
ED CM spoke with pt.  Confirms she is self pay, no pcp. Had medicaid that expired in "september"  States she went and completed an application at DSS to get Medicaid again but has "45 days" before effective.  Pt states she took previous prescriptions to [facility] aid but cost was $40+ and she can not afford.   CM discussed [facility] $4 medication list.  Pt sent out with albuterol inhaler and macrobid previously.  Pt stating she could afford $4-8 medications and prefers not to use indigent program if meds cost less than $10.  CM updated ED RN and EDP.  Reviewed provided written information on local self pay pcps, needymeds.com, low cost pharmacies, local financial resources and other [employer]

## 2011-07-29 ENCOUNTER — Emergency Department (HOSPITAL_COMMUNITY)
Admission: EM | Admit: 2011-07-29 | Discharge: 2011-07-30 | Discharge status: Against Medical Advice | Payer: Self-pay | Attending: Emergency Medicine | Admitting: Emergency Medicine

## 2011-07-29 ENCOUNTER — Encounter (HOSPITAL_COMMUNITY): Payer: Self-pay | Admitting: *Deleted

## 2011-07-29 DIAGNOSIS — Z0389 Encounter for observation for other suspected diseases and conditions ruled out: Secondary | ICD-10-CM | POA: Insufficient documentation

## 2011-07-29 NOTE — ED Notes (Signed)
Pt in stating today her stomach felt upset so she attempted to induce vomiting, states emesis was burgundy in color, states she drank grape juice earlier in the day

## 2011-07-30 MED ORDER — ONDANSETRON 8 MG PO TBDP: 8.0000 mg | ORAL_TABLET | Freq: Once | ORAL | Status: DC

## 2011-07-30 NOTE — ED Notes (Signed)
Pt called in waiting room multiple times, unable to find pt.

## 2011-10-20 ENCOUNTER — Inpatient Hospital Stay (HOSPITAL_COMMUNITY)
Admission: AD | Admit: 2011-10-20 | Discharge: 2011-10-24 | DRG: 897 | Disposition: A | Discharge status: Home / Self Care | Payer: No Typology Code available for payment source | Source: Ambulatory Visit | Attending: Psychiatry | Admitting: Psychiatry

## 2011-10-20 ENCOUNTER — Encounter (HOSPITAL_COMMUNITY): Payer: Self-pay | Admitting: Emergency Medicine

## 2011-10-20 ENCOUNTER — Encounter (HOSPITAL_COMMUNITY): Payer: Self-pay | Admitting: *Deleted

## 2011-10-20 ENCOUNTER — Emergency Department (HOSPITAL_COMMUNITY)
Admission: EM | Admit: 2011-10-20 | Discharge: 2011-10-20 | Disposition: A | Discharge status: Other Acute Inpatient Hospital | Payer: Self-pay | Attending: Emergency Medicine | Admitting: Emergency Medicine

## 2011-10-20 DIAGNOSIS — Z9149 Other personal history of psychological trauma, not elsewhere classified: Secondary | ICD-10-CM

## 2011-10-20 DIAGNOSIS — F1410 Cocaine abuse, uncomplicated: Secondary | ICD-10-CM | POA: Diagnosis present

## 2011-10-20 DIAGNOSIS — F1910 Other psychoactive substance abuse, uncomplicated: Secondary | ICD-10-CM

## 2011-10-20 DIAGNOSIS — IMO0002 Reserved for concepts with insufficient information to code with codable children: Secondary | ICD-10-CM

## 2011-10-20 DIAGNOSIS — F3289 Other specified depressive episodes: Secondary | ICD-10-CM | POA: Insufficient documentation

## 2011-10-20 DIAGNOSIS — F17200 Nicotine dependence, unspecified, uncomplicated: Secondary | ICD-10-CM | POA: Insufficient documentation

## 2011-10-20 DIAGNOSIS — F39 Unspecified mood [affective] disorder: Secondary | ICD-10-CM | POA: Diagnosis present

## 2011-10-20 DIAGNOSIS — F329 Major depressive disorder, single episode, unspecified: Secondary | ICD-10-CM | POA: Insufficient documentation

## 2011-10-20 DIAGNOSIS — F1210 Cannabis abuse, uncomplicated: Secondary | ICD-10-CM | POA: Diagnosis present

## 2011-10-20 DIAGNOSIS — R45851 Suicidal ideations: Secondary | ICD-10-CM

## 2011-10-20 DIAGNOSIS — F32A Depression, unspecified: Secondary | ICD-10-CM | POA: Diagnosis present

## 2011-10-20 DIAGNOSIS — E119 Type 2 diabetes mellitus without complications: Secondary | ICD-10-CM | POA: Insufficient documentation

## 2011-10-20 DIAGNOSIS — I10 Essential (primary) hypertension: Secondary | ICD-10-CM | POA: Insufficient documentation

## 2011-10-20 DIAGNOSIS — F1020 Alcohol dependence, uncomplicated: Principal | ICD-10-CM | POA: Diagnosis present

## 2011-10-20 DIAGNOSIS — F4310 Post-traumatic stress disorder, unspecified: Secondary | ICD-10-CM | POA: Diagnosis present

## 2011-10-20 HISTORY — DX: Depression, unspecified: F32.A

## 2011-10-20 HISTORY — DX: Major depressive disorder, single episode, unspecified: F32.9

## 2011-10-20 LAB — BASIC METABOLIC PANEL
BUN: 8 mg/dL (ref 6–23)
CO2: 24 mEq/L (ref 19–32)
Calcium: 9.2 mg/dL (ref 8.4–10.5)
Creatinine, Ser: 0.81 mg/dL (ref 0.50–1.10)
Glucose, Bld: 107 mg/dL — ABNORMAL HIGH (ref 70–99)

## 2011-10-20 LAB — RAPID URINE DRUG SCREEN, HOSP PERFORMED
Barbiturates: NOT DETECTED
Opiates: NOT DETECTED
Tetrahydrocannabinol: POSITIVE — AB

## 2011-10-20 LAB — URINALYSIS, ROUTINE W REFLEX MICROSCOPIC
Leukocytes, UA: NEGATIVE
Nitrite: NEGATIVE
Protein, ur: NEGATIVE mg/dL
Urobilinogen, UA: 1 mg/dL (ref 0.0–1.0)

## 2011-10-20 LAB — ETHANOL: Alcohol, Ethyl (B): <11 mg/dL (ref 0–11)

## 2011-10-20 LAB — CBC
MCH: 31 pg (ref 26.0–34.0)
MCV: 91.5 fL (ref 78.0–100.0)
Platelets: 322 10*3/uL (ref 150–400)
RBC: 4.22 MIL/uL (ref 3.87–5.11)

## 2011-10-20 LAB — PREGNANCY, URINE: Preg Test, Ur: NEGATIVE

## 2011-10-20 MED ORDER — TRAZODONE HCL 50 MG PO TABS
50.0000 mg | ORAL_TABLET | Freq: Once | ORAL | Status: AC
  Administered 2011-10-20: 50 mg via ORAL
  Filled 2011-10-20 (×2): qty 1

## 2011-10-20 MED ORDER — FOLIC ACID 1 MG PO TABS
1.0000 mg | ORAL_TABLET | Freq: Every day | ORAL | Status: DC
  Administered 2011-10-20: 1 mg via ORAL
  Filled 2011-10-20: qty 1

## 2011-10-20 MED ORDER — IBUPROFEN 600 MG PO TABS: 600.0000 mg | ORAL_TABLET | Freq: Three times a day (TID) | ORAL | Status: DC | PRN

## 2011-10-20 MED ORDER — ALUM & MAG HYDROXIDE-SIMETH 200-200-20 MG/5ML PO SUSP: 30.0000 mL | ORAL | Status: DC | PRN

## 2011-10-20 MED ORDER — LORAZEPAM 1 MG PO TABS: 0.0000 mg | ORAL_TABLET | Freq: Two times a day (BID) | ORAL | Status: DC

## 2011-10-20 MED ORDER — ALBUTEROL SULFATE HFA 108 (90 BASE) MCG/ACT IN AERS: 2.0000 | INHALATION_SPRAY | RESPIRATORY_TRACT | Status: DC | PRN

## 2011-10-20 MED ORDER — THIAMINE HCL 100 MG/ML IJ SOLN: 100.0000 mg | Freq: Every day | INTRAMUSCULAR | Status: DC

## 2011-10-20 MED ORDER — LORAZEPAM 1 MG PO TABS: 0.0000 mg | ORAL_TABLET | Freq: Four times a day (QID) | ORAL | Status: DC

## 2011-10-20 MED ORDER — ACETAMINOPHEN 325 MG PO TABS
650.0000 mg | ORAL_TABLET | Freq: Four times a day (QID) | ORAL | Status: DC | PRN
  Administered 2011-10-22: 650 mg via ORAL

## 2011-10-20 MED ORDER — ZOLPIDEM TARTRATE 5 MG PO TABS: 5.0000 mg | ORAL_TABLET | Freq: Every evening | ORAL | Status: DC | PRN

## 2011-10-20 MED ORDER — ADULT MULTIVITAMIN W/MINERALS CH
1.0000 | ORAL_TABLET | Freq: Every day | ORAL | Status: DC
  Administered 2011-10-20: 1 via ORAL

## 2011-10-20 MED ORDER — LORAZEPAM 1 MG PO TABS: 1.0000 mg | ORAL_TABLET | Freq: Four times a day (QID) | ORAL | Status: DC | PRN

## 2011-10-20 MED ORDER — ACETAMINOPHEN 325 MG PO TABS: 650.0000 mg | ORAL_TABLET | ORAL | Status: DC | PRN

## 2011-10-20 MED ORDER — NICOTINE 21 MG/24HR TD PT24
21.0000 mg | MEDICATED_PATCH | Freq: Every day | TRANSDERMAL | Status: DC
  Administered 2011-10-21 – 2011-10-22 (×2): 21 mg via TRANSDERMAL
  Filled 2011-10-20 (×6): qty 1

## 2011-10-20 MED ORDER — ONDANSETRON HCL 4 MG PO TABS: 4.0000 mg | ORAL_TABLET | Freq: Three times a day (TID) | ORAL | Status: DC | PRN

## 2011-10-20 MED ORDER — LORAZEPAM 2 MG/ML IJ SOLN: 1.0000 mg | Freq: Four times a day (QID) | INTRAMUSCULAR | Status: DC | PRN

## 2011-10-20 MED ORDER — VITAMIN B-1 100 MG PO TABS
100.0000 mg | ORAL_TABLET | Freq: Every day | ORAL | Status: DC
  Administered 2011-10-20: 100 mg via ORAL
  Filled 2011-10-20: qty 1

## 2011-10-20 MED ORDER — NICOTINE 21 MG/24HR TD PT24
21.0000 mg | MEDICATED_PATCH | Freq: Every day | TRANSDERMAL | Status: DC
  Administered 2011-10-20: 21 mg via TRANSDERMAL
  Filled 2011-10-20: qty 1

## 2011-10-20 MED ORDER — MAGNESIUM HYDROXIDE 400 MG/5ML PO SUSP: 30.0000 mL | Freq: Every day | ORAL | Status: DC | PRN

## 2011-10-20 NOTE — ED Notes (Signed)
Pt just put up for discharge by MD, dinner is here so she will transport after she eats.

## 2011-10-20 NOTE — ED Provider Notes (Signed)
History     CSN: [id number]  Arrival date & time 10/20/11  0057   First MD Initiated Contact with Patient 10/20/11 0127      Chief Complaint  Patient presents with  . Medical Clearance    (Consider location/radiation/quality/duration/timing/severity/associated sxs/prior treatment) HPI Patient is a 26 year old female with history of depression as well as polysubstance abuse who presents today complaining of worsening depression and feeling that "everything is too much to handle". She denies suicidal or homicidal ideation. Patient admits last cocaine use was 2 days ago. Patient drinks 2 to 3 40s a day.  Patient denies any other symptoms today. Past Medical History  Diagnosis Date  . History of concussion   . Depression     History reviewed. No pertinent past surgical history.  Family History  Problem Relation Age of Onset  . Hypertension Other   . Diabetes Other     History  Substance Use Topics  . Smoking status: Current Everyday Smoker -- 0.5 packs/day    Types: Cigarettes  . Smokeless tobacco: Never Used  . Alcohol Use: 2.9 oz/week    4 Cans of beer, 1 Drinks containing 0.5 oz of alcohol per week     daily    OB History    Grav Para Term Preterm Abortions TAB SAB Ect Mult Living                  Review of Systems  Constitutional: Negative.   HENT: Negative.   Eyes: Negative.   Respiratory: Negative.   Cardiovascular: Negative.   Gastrointestinal: Negative.   Genitourinary: Negative.   Musculoskeletal: Negative.   Skin: Negative.   Neurological: Negative.   Hematological: Negative.   Psychiatric/Behavioral: Positive for dysphoric mood. Negative for suicidal ideas.  All other systems reviewed and are negative.    Allergies  Review of patient's allergies indicates no known allergies.  Home Medications   Current Outpatient Rx  Name Route Sig Dispense Refill  . ALBUTEROL SULFATE HFA 108 (90 BASE) MCG/ACT IN AERS Inhalation Inhale 2 puffs into the  lungs every 4 (four) hours as needed for wheezing (Or cough). 1 Inhaler 0    BP 104/67  Pulse 55  Temp(Src) 98.3 F (36.8 C) (Oral)  Resp 20  SpO2 100%  LMP 10/12/2011  Physical Exam  Nursing note and vitals reviewed. GEN: Well-developed, well-nourished female in no distress HEENT: Atraumatic, normocephalic. Oropharynx clear without erythema EYES: PERRLA BL, no scleral icterus. NECK: Trachea midline, no meningismus CV: regular rate and rhythm. No murmurs, rubs, or gallops PULM: No respiratory distress.  No crackles, wheezes, or rales. GI: soft, non-tender. No guarding, rebound, or tenderness. + bowel sounds  GU: deferred Neuro: cranial nerves 2-12 intact, no abnormalities of strength or sensation, A and O x 3 MSK: Patient moves all 4 extremities symmetrically, no deformity, edema, or injury noted Skin: No rashes petechiae, purpura, or jaundice Psych: Endorses polysubstance abuse and depression. Denies hallucinations, suicidal ideation, R. homicidal ideation  ED Course  Procedures (including critical care time)  Labs Reviewed  CBC - Abnormal; Notable for the following:    WBC 11.3 (*)    All other components within normal limits  BASIC METABOLIC PANEL - Abnormal; Notable for the following:    Sodium 134 (*)    Glucose, Bld 107 (*)    All other components within normal limits  URINE RAPID DRUG SCREEN (HOSP PERFORMED) - Abnormal; Notable for the following:    Cocaine POSITIVE (*)    Tetrahydrocannabinol POSITIVE (*)  All other components within normal limits  ETHANOL  URINALYSIS, ROUTINE W REFLEX MICROSCOPIC  PREGNANCY, URINE   No results found.   1. Polysubstance abuse   2. Depression       MDM  Patient was evaluated by myself and was neither suicidal nor homicidal. She did have evidence of cocaine and THC in her urine. Patient reports regular and heavy daily alcohol use. Alcohol is undetectable on presentation. Patient did complain of severe symptoms of  depression. Following medical clearance patient was transferred to the psych daily. Holding orders were placed including alcohol withdrawal prevention orders. Patient is pending assessment by ACT at this time.        Cyndra Numbers, MD 10/20/11 [phone]

## 2011-10-20 NOTE — Tx Team (Signed)
Initial Interdisciplinary Treatment Plan  PATIENT STRENGTHS: (choose at least two) Ability for insight Communication skills General fund of knowledge Physical Health  PATIENT STRESSORS: Legal issue Marital or family conflict Medication change or noncompliance Substance abuse Traumatic event   PROBLEM LIST: Problem List/Patient Goals Date to be addressed Date deferred Reason deferred Estimated date of resolution  Depression  10/20/11     Risk for SI  10/20/11     Substance Abuse  10/20/11                                          DISCHARGE CRITERIA:  Ability to meet basic life and health needs Adequate post-discharge living arrangements Improved stabilization in mood, thinking, and/or behavior Motivation to continue treatment in a less acute level of care Reduction of life-threatening or endangering symptoms to within safe limits Safe-care adequate arrangements made Verbal commitment to aftercare and medication compliance Withdrawal symptoms are absent or subacute and managed without 24-hour nursing intervention  PRELIMINARY DISCHARGE PLAN: Attend aftercare/continuing care group Attend PHP/IOP Placement in alternative living arrangements  PATIENT/FAMIILY INVOLVEMENT: This treatment plan has been presented to and reviewed with the patient, Tiffany [name].  The patient and family have been given the opportunity to ask questions and make suggestions.  Dyke Brackett 10/20/2011, 10:38 PM
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
## 2011-10-20 NOTE — BHH Counselor (Signed)
Accepted to 301-1 at [facility] by Lloyd Huger PA to services of Dr. Koren Shiver.  Pt can go by security.

## 2011-10-20 NOTE — ED Notes (Signed)
Pt is being transported to [facility] via security accompanied by a tech, two bags of belongings sent with security and tech. Shon Baton, RN notified pt on the way.

## 2011-10-20 NOTE — ED Provider Notes (Signed)
I assumed care from dr hunt. Increased depression and polysubstance abuse.  Resting comfortably with no acute complaints at the time of my evaluation this am.  Will await further recommendations from ACT team.  Medically clear for psych eval.  RRR Lungs CTAB Abd soft, NT, ND  Labs reviewed and temp holding orders are placed.  Dayton Bailiff, MD 10/20/11 [phone]
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
## 2011-10-20 NOTE — ED Notes (Signed)
Pt states she is not feeling very good about things right now  Pt states she is having problems with her daughter and it is just "too stressful right now"  Pt states she has depression  Pt states she has hx of same and was placed at [facility]  Denies SI/HI

## 2011-10-20 NOTE — ED Notes (Signed)
Report called to [facility] Key, RN asked to hold the pt for 30 minutes before sending her over to [facility].

## 2011-10-20 NOTE — Progress Notes (Signed)
Patient ID: Tiffany [name], female   DOB: 1986-04-22, 26 y.o.   MRN: [id number] 10-20-11@ 1844 rn called from ed and wanted to know if sending this pt would hold up the tech coming with the patient to bh. rn that took the call said he could not guarantee that the tech would not be help up while oncoming shift comes in and gets report. rn encouraged the caller to use her own discretion.

## 2011-10-21 DIAGNOSIS — F329 Major depressive disorder, single episode, unspecified: Secondary | ICD-10-CM | POA: Diagnosis present

## 2011-10-21 DIAGNOSIS — F4310 Post-traumatic stress disorder, unspecified: Secondary | ICD-10-CM

## 2011-10-21 DIAGNOSIS — F1910 Other psychoactive substance abuse, uncomplicated: Secondary | ICD-10-CM

## 2011-10-21 DIAGNOSIS — F1994 Other psychoactive substance use, unspecified with psychoactive substance-induced mood disorder: Secondary | ICD-10-CM

## 2011-10-21 MED ORDER — ALBUTEROL SULFATE HFA 108 (90 BASE) MCG/ACT IN AERS: 2.0000 | INHALATION_SPRAY | RESPIRATORY_TRACT | Status: DC | PRN

## 2011-10-21 MED ORDER — TRAZODONE HCL 50 MG PO TABS
50.0000 mg | ORAL_TABLET | Freq: Every evening | ORAL | Status: DC | PRN
  Administered 2011-10-21: 50 mg via ORAL
  Filled 2011-10-21: qty 1

## 2011-10-21 MED ORDER — CITALOPRAM HYDROBROMIDE 10 MG PO TABS
10.0000 mg | ORAL_TABLET | Freq: Every day | ORAL | Status: DC
  Administered 2011-10-21 – 2011-10-24 (×4): 10 mg via ORAL
  Filled 2011-10-21 (×3): qty 1
  Filled 2011-10-21: qty 14
  Filled 2011-10-21 (×3): qty 1

## 2011-10-21 NOTE — Progress Notes (Signed)
DISCHARGE MEDICATIONS:   1. Celexa 30 mg p.o. q.a.m. for depression and anxiety.   2. Abilify 2 mg p.o. q.h.s. for mood stabilization.   3. Doxycycline 100 mg p.o. b.i.d. until December 02, 2010, to address her      MRSA infection.   4. Trazodone 150 mg p.o. q.h.s. p.r.n. for sleep.

## 2011-10-21 NOTE — Progress Notes (Signed)
Patient ID: Tiffany [name], female   DOB: [birth year], 26 y.o.   MRN: [id number]  Pt. did not attend a.m. after-care planning.

## 2011-10-21 NOTE — Progress Notes (Signed)
Patient ID: Tiffany [name], female   DOB: 05-31-1986, 26 y.o.   MRN: [id number]  [facility] Group Notes:  (Counselor/Nursing/MHT/Case Management/Adjunct)  10/21/2011 1:15 PM  Type of Therapy:  Group Therapy, Dance/Movement Therapy   Participation Level:  Minimal  Participation Quality:  Appropriate  Affect:  Appropriate  Cognitive:  Appropriate  Insight:  Limited  Engagement in Group:  Limited  Engagement in Therapy:  Good  Modes of Intervention:  Clarification, Problem-solving, Role-play, Socialization and Support  Summary of Progress/Problems:  Therapist discussed choices and reasons why we choose good or bad choices in life.  Therapist ask members of group to read aloud the "Autobiography in Five Short Chapters" and ask group members where are they currently in relation to the poem.  Pt. Stated, "I life you choose the road you are going to take.  It's up to you to decide to turn down another street".  Pt. seemed to understand where she stands in her addiction and stated that she is  "tired and ready to make a change".   Rhunette Croft

## 2011-10-21 NOTE — Progress Notes (Signed)
Pt. Is presently in group. Contracts for safety. No SI or HI. Would like to gain custody back from her 27 year old child. presenlty not very talkative .

## 2011-10-21 NOTE — H&P (Signed)
  Pt was seen by me today and I agree with the key elements documented in H&P.  
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
## 2011-10-21 NOTE — Progress Notes (Addendum)
[facility] Adult Inpatient Family/Significant Other Suicide Prevention Education  Suicide Prevention Education:  Contact Attempts: Martie [place] Pt's cousin [phone] has been identified by the patient as the family member/significant other with whom the patient will be residing, and identified as the person(s) who will aid the patient in the event of a mental health crisis.  With written consent from the patient, two attempts were made to provide suicide prevention education, prior to and/or following the patient's discharge.  We were unsuccessful in providing suicide prevention education.  A suicide education pamphlet was given to the patient to share with family/significant other.  Date and time of first attempt:10/21/2011 12:43 PM  Date and time of second attempt: 10/21/2011 3:28 PM   Vanetta Mulders L 10/21/2011, 12:42 PM

## 2011-10-22 DIAGNOSIS — F1410 Cocaine abuse, uncomplicated: Secondary | ICD-10-CM

## 2011-10-22 DIAGNOSIS — F1220 Cannabis dependence, uncomplicated: Secondary | ICD-10-CM

## 2011-10-22 DIAGNOSIS — F1020 Alcohol dependence, uncomplicated: Principal | ICD-10-CM

## 2011-10-22 MED ORDER — ARIPIPRAZOLE 2 MG PO TABS
2.0000 mg | ORAL_TABLET | Freq: Every day | ORAL | Status: DC
  Administered 2011-10-22 – 2011-10-23 (×2): 2 mg via ORAL
  Filled 2011-10-22 (×3): qty 1
  Filled 2011-10-22: qty 14
  Filled 2011-10-22: qty 1

## 2011-10-22 MED ORDER — TRAZODONE HCL 50 MG PO TABS
150.0000 mg | ORAL_TABLET | Freq: Every day | ORAL | Status: DC
  Administered 2011-10-22 – 2011-10-23 (×2): 150 mg via ORAL
  Filled 2011-10-22 (×2): qty 1
  Filled 2011-10-22: qty 3
  Filled 2011-10-22: qty 1
  Filled 2011-10-22: qty 42

## 2011-10-22 NOTE — Progress Notes (Signed)
[facility] Group Notes:  (Counselor/Nursing/MHT/Case Management/Adjunct)  10/22/2011 4:45 PM  Type of Therapy:  Group Therapy at 1:15  Participation Level:  Did Not Attend  Clide Dales 10/22/2011, 4:45 PM

## 2011-10-22 NOTE — Discharge Planning (Signed)
Saturday admission attended AM group, good participation.  States she recently relapsed due to "family issues" and is now homeless.  Has an 27 yo that mother is watching.  Also states she has not taken medication since last here, and that she is "self medicating"  Talked about her experience at [facility] within the last year where she was asked to leave.  Very convoluted story that involved Legal Aid and her celebrity status that made no sense.  Also spoke about a "Teen" program but when asked if it was [facility] said that didn't sound right.  Rejected [facility] as too long.  Thinks she might be interested in [facility] since she could go directly from here.

## 2011-10-22 NOTE — Progress Notes (Signed)
Pt isolative at times but out in milieu a bit more this evening. Engaged and animated during TC. Attended AA. She denies any problems with w/d at this time. Anxious to get started on celexa - first dose tonight. Supported. Given trazadone for sleep with effectiveness. Denies any SI/HI/AVH and remains safe. Lawrence Marseilles
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
## 2011-10-23 DIAGNOSIS — F329 Major depressive disorder, single episode, unspecified: Secondary | ICD-10-CM

## 2011-10-23 LAB — URINALYSIS, ROUTINE W REFLEX MICROSCOPIC
Nitrite: NEGATIVE
Specific Gravity, Urine: 1.021 (ref 1.005–1.030)
pH: 7 (ref 5.0–8.0)

## 2011-10-23 LAB — URINE MICROSCOPIC-ADD ON

## 2011-10-23 MED ORDER — CLOTRIMAZOLE 1 % VA CREA
1.0000 | TOPICAL_CREAM | Freq: Every day | VAGINAL | Status: DC
  Administered 2011-10-23: 1 via VAGINAL
  Filled 2011-10-23 (×2): qty 45

## 2011-10-23 NOTE — Progress Notes (Signed)
[facility] Group Notes:  (Counselor/Nursing/MHT/Case Management/Adjunct)  10/23/2011 10:05 PM  Type of Therapy:  Group Therapy at 1:15  Participation Level:  Minimal  Participation Quality:  Drowsy and Inattentive  Affect:  Flat  Cognitive:  Oriented  Insight:  None shared  Engagement in Group:  None  Engagement in Therapy:  None  Modes of Intervention:  Clarification and Socialization  Summary of Progress/Problems:  Lockie was quiet during group discussion regarding feelings about diagnosis.   Clide Dales 10/23/2011, 10:07 PM

## 2011-10-23 NOTE — Treatment Plan (Signed)
Interdisciplinary Treatment Plan Update (Adult)  Date: 10/23/2011  Time Reviewed: 8:21 AM   Progress in Treatment: Attending groups: Yes Participating in groups: Yes Taking medication as prescribed: Yes Tolerating medication: Yes   Family/Significant other contact made:  If Ta gives permission Patient understands diagnosis:  Yes  As evidenced by asking for help with substance abuse, mood regulation and homelessness Discussing patient identified problems/goals with staff:  Yes  See below Medical problems stabilized or resolved:  Yes Denies suicidal/homicidal ideation: Yes  In tx team Issues/concerns per patient self-inventory:  None noted Other:  New problem(s) identified: N/A  Reason for Continuation of Hospitalization: Anxiety Depression Medication stabilization  Interventions implemented related to continuation of hospitalization: Medication trial, encourage group attendance and participation  Secure treatment bed  Additional comments:  Estimated length of stay:  Discharge Plan:  New goal(s): N/A  Review of initial/current patient goals per problem list:   1.  Goal(s):Eliminate SI  Met:  Yes  Target date:5/21  As evidenced [id number] denies SI today in tx team  2.  Goal (s):Stabilize mood  Met:  No  Target date:5/23  As evidenced [id number] will report that her depression and anxiety are manageable   3.  Goal(s): Get into rehab from here  Met:  No  Target date:5/23  As evidenced [id number] of bed at [facility] or [facility]  Tiffany [name] is also willing to go to [facility] if they will accept her and if she can find a place to stay until then  4.  Goal(s):  Met:  No  Target date:  As evidenced by:  Attendees: Patient:  Tiffany [name] 10/23/2011 8:21 AM  Family:     Physician:  Lupe Carney 10/23/2011 8:21 AM   Nursing:  Robbie Louis  10/23/2011 8:21 AM   Case Manager:  Richelle Ito, LCSW 10/23/2011 8:21 AM   Counselor:  Ronda Fairly, LCSWA 10/23/2011  8:21 AM   Other:     Other:     Other:     Other:      Scribe for Treatment Team:   Ida Rogue, 10/23/2011 8:21 AM

## 2011-10-23 NOTE — Progress Notes (Signed)
Patient ID: Tiffany [name], female   DOB: 01/22/1986, 26 y.o.   MRN: [id number] Marilynne presents fully alert, well-groomed, in full contact with reality. She was seen team meeting today with the primary purpose of discussing her future plans. She is looking for residential treatment and feels that this will be beneficial in helping her achieve a long term abstinence from substances. She is interested in the [facility] program, or possibly [facility] recovery services, or is open to most any program that she feels she can get at least 14, or 28 day stay. She is tolerating Celexa and Abilify well, and sleep is good on trazodone. She has no concerns about medications and denies any side effects. She denies any depressed mood or suicidal thoughts today.  Mental status exam Well-groomed female, pleasant, and cooperative. Speech is articulate. Thoughts and speech  are normally organized. No dangerous ideas. Insight is fairly good. Impulse control and judgment normal no dangerous ideas. Cognition fully intact.  Plan: Continue current meds. We'll explore options in bed availability. She is willing to complete an application for the [facility] program.

## 2011-10-23 NOTE — Progress Notes (Signed)
Pt came to the medication window shortly after group ended asking about her medications.  Informed pt that meds would be given at 2130.  Pt was agreeable.  Pt is aware her Trazodone has been increased.  Pt reports minimal withdrawal symptoms.  She denies SI/HI/AV.  Pt is appropriate/cooperative with staff.  She reports that she may discharge tomorrow to [facility] if a bed is available.  Pt voices no needs/concerns at this time.  Safety maintained with q15 minute checks.

## 2011-10-23 NOTE — Progress Notes (Signed)
[facility] Adult Inpatient Family/Significant Other Suicide Prevention Education  Suicide Prevention Education:  Contact Attempts:[place] at [phone] patient's cousin has been identified by the patient as the family member/significant other with whom the patient will be residing, and identified as the person(s) who will aid the patient in the event of a mental health crisis.  With written consent from the patient, two attempts were made to provide suicide prevention education, prior to and/or following the patient's discharge.  We were unsuccessful in providing suicide prevention education.  A suicide education pamphlet was given to the patient to share with family/significant other.  Date and time of third attempt: 10/22/2011 1:00 PM  Date and time of fourth attempt: 10/23/2011 7:02 PM   Tiffany [name] 10/23/2011, 7:02 PM

## 2011-10-23 NOTE — Progress Notes (Signed)
Pt has been up for groups today.  She rated her depression a 4 hopelessness a 7 and anxiety a 4 today on her self-inventory.  She denied any S/H ideations.  She wants to go to [facility] tomorrow but has been told if bed not available then she would stay with family until [facility]. Pt did just c/o symptoms of vaginal yeast infection.  She demanded to go across the street to ED to get medication.  Informed her that would not be an option for her.  She then wanted to talk to "someone in charge"  Minerva Areola RN [facility] went to see pt with this nurse.   Dr. Allena Katz was on unit and ordered u/a and placed pt on monistat cream.  Informed pt and she calmed down.

## 2011-10-23 NOTE — Discharge Planning (Signed)
This afternoon Tiffany [name] states she will go to [facility] if they have a bed tomorrow.  If not, she will stay with family and friends until she gets into [facility] on 5/29.

## 2011-10-24 MED ORDER — METRONIDAZOLE 500 MG PO TABS
2000.0000 mg | ORAL_TABLET | ORAL | Status: AC
  Administered 2011-10-24: 2000 mg via ORAL
  Filled 2011-10-24: qty 4

## 2011-10-24 MED ORDER — FLUCONAZOLE 150 MG PO TABS
150.0000 mg | ORAL_TABLET | Freq: Once | ORAL | Status: AC
  Administered 2011-10-24: 150 mg via ORAL
  Filled 2011-10-24: qty 1

## 2011-10-24 MED ORDER — CITALOPRAM HYDROBROMIDE 10 MG PO TABS: 10.0000 mg | ORAL_TABLET | Freq: Every day | ORAL | Status: DC

## 2011-10-24 MED ORDER — TRAZODONE HCL 150 MG PO TABS: 150.0000 mg | ORAL_TABLET | Freq: Every day | ORAL | Status: DC

## 2011-10-24 MED ORDER — ARIPIPRAZOLE 2 MG PO TABS: 2.0000 mg | ORAL_TABLET | Freq: Every day | ORAL | Status: DC

## 2011-10-24 MED ORDER — CLOTRIMAZOLE 1 % VA CREA: TOPICAL_CREAM | VAGINAL | Status: DC

## 2011-10-24 MED ORDER — ALBUTEROL SULFATE HFA 108 (90 BASE) MCG/ACT IN AERS: 2.0000 | INHALATION_SPRAY | RESPIRATORY_TRACT | Status: DC | PRN
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
## 2011-10-24 NOTE — Discharge Planning (Deleted)
Tiffany [name] attended group this AM with coaxing.  She c/o being tired due to the medications that were started last night.  Mood much better today.  States she will return to shelter in [place] and follow up with [facility].  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  Getting her own place through [employer].  Likely d/c Friday.

## 2011-10-24 NOTE — Treatment Plan (Signed)
Interdisciplinary Treatment Plan Update (Adult)  Date: 10/24/2011  Time Reviewed: 8:30 AM   Progress in Treatment: Attending groups: Yes Participating in groups: Yes Taking medication as prescribed: Yes Tolerating medication: Yes   Family/Significant othe contact made:   Patient understands diagnosis:  Yes Discussing patient identified problems/goals with staff:  Yes Medical problems stabilized or resolved:  Yes Denies suicidal/homicidal ideation: Yes  In tx team Issues/concerns per patient self-inventory:  Yes  Depression 3, Anxiety 4 Other:  New problem(s) identified: N/A  Reason for Continuation of Hospitalization: Other; describe D/C today  Interventions implemented related to continuation of hospitalization:   Additional comments:  Estimated length of stay:D/c today  Discharge Plan:See below  New goal(s): N/A  Review of initial/current patient goals per problem list:    1.  Goal(s):Eliminate SI  Met:  Yes  Target date:See previous tx plan  As evidenced by:  2.  Goal (s):Stabilize mood  Met:  Yes  Target date:5/22  As evidenced ZO:XWRUEA reports her mood is good and anxiety is minimal   3.  Goal(s):Get into rehab from here  Met:  No  Target date:5/29  As evidenced by:No beds at [facility] will stay with her grandparents until she can get into [facility] next Wed the 29th  4.  Goal(s):  Met:  Yes  Target date:  As evidenced by:  Attendees: Patient:  Tiffany [name] 10/24/2011 8:30 AM  Family:     Physician:  Lupe Carney 10/24/2011 8:30 AM   Nursing:  Robbie Louis  10/24/2011 8:30 AM   Case Manager:  Richelle Ito, LCSW 10/24/2011 8:30 AM   Counselor:  Ronda Fairly, LCSWA 10/24/2011 8:30 AM   Other:     Other:     Other:     Other:      Scribe for Treatment Team:   Ida Rogue, 10/24/2011 8:30 AM

## 2011-10-24 NOTE — Progress Notes (Signed)
Pt observed in her room awake in the bed reading.  She reports she is having some vaginal itching/discomfort which intensified during the day.  Pt was informed that vaginal cream was ordered to start tonight.  Pt denies any withdrawal symptoms at this time.  She denies SI/HI.  She reports that her plan is to go to [facility] tomorrow if a bed is available.  If not, then she is going to stay with some family or friends until her appt with [facility] on the 29th.  She says she is a little anxious about going to [facility] because of her past experience with them, but she will do whatever she needs to do to get the help she needs.  Pt is pleasant/cooperative with this Clinical research associate.  Pt voices no other needs/concerns.  Safety maintained with q15 minute checks.

## 2011-10-24 NOTE — Progress Notes (Signed)
[facility] Group Notes:  (Counselor/Nursing/MHT/Case Management/Adjunct)  10/24/2011 12:15 PM  Type of Therapy:  Group Therapy  Participation Level:  Did Not Attend   Tiffany [name] 10/24/2011, 12:15 PM
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
## 2011-10-24 NOTE — Progress Notes (Signed)
Pt denies SI/HI/AVH. Discharge instructions, prescriptions, and f/u care explained to pt. Pt states understanding. All belongings from [facility] returned to pt. Pt discharged to home.

## 2011-10-24 NOTE — Progress Notes (Signed)
[facility] Case Management Discharge Plan:  Will you be returning to the same living situation after discharge: No. At discharge, do you have transportation home?:Yes,  either family or bus pass Do you have the ability to pay for your medications:Yes,  mental health  Interagency Information:     Release of information consent forms completed and in the chart;  Patient's signature needed at discharge.  Patient to Follow up at:  Follow-up Information    Follow up with [facility] on 10/31/2011. (8 AM sharp for your assessment)    Contact information:   [address]  [place]  [phone]      [facility] with [facility]  [phone].      Follow up with [facility] on 10/26/2011. (Walk in at 8 AM Friday for your hospital follow up appointment.  Let them know you will need to have medication to take with you when you go to [facility] on wed the 29th)    Contact information:   [address] [place]  [phone]         Patient denies SI/HI:   Yes,  yes    Safety Planning and Suicide Prevention discussed:  Yes,  yes  Barrier to discharge identified:No.  Summary and Recommendations:   Ida Rogue 10/24/2011, 11:14 AM

## 2011-10-24 NOTE — Progress Notes (Signed)
[facility] Adult Inpatient Family/Significant Other Suicide Prevention Education  Suicide Prevention Education:  Patient's cousin was called on four different occasions over period of three days with no contact. Writer provided suicide prevention education directly to patient; conversation included risk factors, warning signs and resources to contact for help. Mobile crisis services explained and contact card placed in chart for pt to receive at discharge.   Clide Dales 10/24/2011, 10:40 AM

## 2011-10-24 NOTE — Progress Notes (Signed)
[facility] Group Notes:  (Counselor/Nursing/MHT/Case Management/Adjunct)  10/24/2011 2:24 PM  Type of Therapy:  Group Therapy at 1PM  Participation Level:  Active  Participation Quality:  Appropriate and Sharing  Affect:  Appropriate  Cognitive:  Appropriate  Insight:  Good  Engagement in Group:  Good  Engagement in Therapy:  Good  Modes of Intervention:  Clarification, Problem-solving, Socialization and Support  Summary of Progress/Problems:  Tiffany [name] participated in group therapy session today and shared at several points.  She shared photo of her daughter with group. She shared "My anger is often about something that happened so long ago that I don't even remember what the specifics are or why it is coming up now."  Pt choose card with message about overcoming obstacles which she requested copy of "each obstacle you overcome is a stepping stone on your path to greatness.  Appreciate the obstacle for it empowers you to courageously face future barriers in your quest for success."   Clide Dales 10/24/2011, 2:24 PM

## 2011-10-25 NOTE — Progress Notes (Signed)
Patient Discharge Instructions:  After Visit Summary (AVS):   Faxed to:  10/25/2011 Psychiatric Admission Assessment Note:   Faxed to:  10/25/2011 Suicide Risk Assessment - Discharge Assessment:   Faxed to:  10/25/2011 Faxed/Sent to the Next Level Care provider:  10/25/2011  Faxed to [facility] @ [phone] And to [facility] @ [phone]  Wandra Scot, 10/25/2011, 4:36 PM

## 2011-10-30 ENCOUNTER — Emergency Department (HOSPITAL_COMMUNITY)
Admission: EM | Admit: 2011-10-30 | Discharge: 2011-10-30 | Disposition: A | Discharge status: Home / Self Care | Payer: No Typology Code available for payment source | Attending: Emergency Medicine | Admitting: Emergency Medicine

## 2011-10-30 DIAGNOSIS — R109 Unspecified abdominal pain: Secondary | ICD-10-CM | POA: Insufficient documentation

## 2011-10-30 DIAGNOSIS — F3289 Other specified depressive episodes: Secondary | ICD-10-CM | POA: Insufficient documentation

## 2011-10-30 DIAGNOSIS — IMO0002 Reserved for concepts with insufficient information to code with codable children: Secondary | ICD-10-CM | POA: Insufficient documentation

## 2011-10-30 DIAGNOSIS — F329 Major depressive disorder, single episode, unspecified: Secondary | ICD-10-CM | POA: Insufficient documentation

## 2011-10-30 MED ORDER — CEFIXIME 400 MG PO TABS
ORAL_TABLET | ORAL | Status: AC
  Administered 2011-10-30: 400 mg via ORAL
  Filled 2011-10-30: qty 1

## 2011-10-30 MED ORDER — AZITHROMYCIN 1 G PO PACK
PACK | ORAL | Status: AC
  Administered 2011-10-30: 1 g via ORAL
  Filled 2011-10-30: qty 1

## 2011-10-30 MED ORDER — PROMETHAZINE HCL 25 MG PO TABS
ORAL_TABLET | ORAL | Status: AC
  Filled 2011-10-30: qty 3

## 2011-10-30 MED ORDER — METRONIDAZOLE 500 MG PO TABS
ORAL_TABLET | ORAL | Status: AC
  Filled 2011-10-30: qty 4

## 2011-10-30 MED ORDER — LEVONORGESTREL 0.75 MG PO TABS
ORAL_TABLET | ORAL | Status: AC
  Administered 2011-10-30: 1.5 mg via ORAL
  Filled 2011-10-30: qty 2
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
## 2011-10-30 NOTE — ED Provider Notes (Signed)
Medical screening examination/treatment/procedure(s) were performed by non-physician practitioner and as supervising physician I was immediately available for consultation/collaboration.   Celene Kras, MD 10/30/11 [phone]

## 2011-10-31 ENCOUNTER — Encounter: Payer: Self-pay | Admitting: *Deleted

## 2011-11-04 ENCOUNTER — Encounter (HOSPITAL_COMMUNITY): Payer: Self-pay | Admitting: Emergency Medicine

## 2011-11-04 ENCOUNTER — Emergency Department (HOSPITAL_COMMUNITY): Payer: Self-pay

## 2011-11-04 ENCOUNTER — Emergency Department (HOSPITAL_COMMUNITY)
Admission: EM | Admit: 2011-11-04 | Discharge: 2011-11-05 | Disposition: A | Discharge status: Other Acute Inpatient Hospital | Payer: Self-pay

## 2011-11-04 DIAGNOSIS — R4182 Altered mental status, unspecified: Secondary | ICD-10-CM | POA: Insufficient documentation

## 2011-11-04 DIAGNOSIS — F1910 Other psychoactive substance abuse, uncomplicated: Secondary | ICD-10-CM | POA: Insufficient documentation

## 2011-11-04 DIAGNOSIS — F17200 Nicotine dependence, unspecified, uncomplicated: Secondary | ICD-10-CM | POA: Insufficient documentation

## 2011-11-04 DIAGNOSIS — F22 Delusional disorders: Secondary | ICD-10-CM | POA: Insufficient documentation

## 2011-11-04 DIAGNOSIS — R443 Hallucinations, unspecified: Secondary | ICD-10-CM

## 2011-11-04 DIAGNOSIS — F411 Generalized anxiety disorder: Secondary | ICD-10-CM | POA: Insufficient documentation

## 2011-11-04 DIAGNOSIS — IMO0002 Reserved for concepts with insufficient information to code with codable children: Secondary | ICD-10-CM | POA: Insufficient documentation

## 2011-11-04 LAB — CBC
MCH: 31.7 pg (ref 26.0–34.0)
MCHC: 34.5 g/dL (ref 30.0–36.0)
Platelets: 316 10*3/uL (ref 150–400)

## 2011-11-04 LAB — RAPID URINE DRUG SCREEN, HOSP PERFORMED
Cocaine: POSITIVE — AB
Opiates: NOT DETECTED
Tetrahydrocannabinol: POSITIVE — AB

## 2011-11-04 LAB — COMPREHENSIVE METABOLIC PANEL
ALT: 10 U/L (ref 0–35)
AST: 18 U/L (ref 0–37)
Calcium: 9.5 mg/dL (ref 8.4–10.5)
GFR calc Af Amer: >90 mL/min (ref >90)
Glucose, Bld: 92 mg/dL (ref 70–99)
Sodium: 135 mEq/L (ref 135–145)
Total Protein: 7.3 g/dL (ref 6.0–8.3)

## 2011-11-04 LAB — ACETAMINOPHEN LEVEL: Acetaminophen (Tylenol), Serum: <15 ug/mL (ref 10–30)

## 2011-11-04 MED ORDER — ALUM & MAG HYDROXIDE-SIMETH 200-200-20 MG/5ML PO SUSP: 30.0000 mL | ORAL | Status: DC | PRN

## 2011-11-04 MED ORDER — ONDANSETRON HCL 4 MG PO TABS: 4.0000 mg | ORAL_TABLET | Freq: Three times a day (TID) | ORAL | Status: DC | PRN

## 2011-11-04 MED ORDER — DIPHENHYDRAMINE HCL 25 MG PO CAPS: 50.0000 mg | ORAL_CAPSULE | Freq: Once | ORAL | Status: DC

## 2011-11-04 MED ORDER — ZIPRASIDONE MESYLATE 20 MG IM SOLR
20.0000 mg | Freq: Once | INTRAMUSCULAR | Status: AC
  Administered 2011-11-05: 20 mg via INTRAMUSCULAR

## 2011-11-04 MED ORDER — LORAZEPAM 1 MG PO TABS: 1.0000 mg | ORAL_TABLET | Freq: Three times a day (TID) | ORAL | Status: DC | PRN

## 2011-11-04 MED ORDER — ACETAMINOPHEN 325 MG PO TABS: 650.0000 mg | ORAL_TABLET | ORAL | Status: DC | PRN

## 2011-11-04 MED ORDER — IBUPROFEN 600 MG PO TABS: 600.0000 mg | ORAL_TABLET | Freq: Three times a day (TID) | ORAL | Status: DC | PRN

## 2011-11-04 MED ORDER — LORAZEPAM 1 MG PO TABS
1.0000 mg | ORAL_TABLET | Freq: Once | ORAL | Status: AC
  Administered 2011-11-04: 1 mg via ORAL
  Filled 2011-11-04: qty 1
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
## 2011-11-04 NOTE — ED Provider Notes (Addendum)
Medical screening examination/treatment/procedure(s) were conducted as a shared visit with non-physician practitioner(s) and myself.  I personally evaluated the patient during the encounter  Date: 12/25/2011  Rate: 70  Rhythm: normal sinus rhythm  QRS Axis: normal  Intervals: normal  ST/T Wave abnormalities: early repolarization  Conduction Disutrbances:none  Narrative Interpretation:   Old EKG Reviewed: none available   Pt with bizarre behavior, suspect psychosis, tele-psych ordered  Toy Baker, MD 11/04/11 2325  Toy Baker, MD 12/25/11 2352

## 2011-11-04 NOTE — ED Notes (Signed)
1mg  Ativan given per order. 2 pt identifiers verified prior to administration.

## 2011-11-04 NOTE — ED Provider Notes (Signed)
History     CSN: [id number]  Arrival date & time 11/04/11  2028   First MD Initiated Contact with Patient 11/04/11 2108      Chief Complaint  Patient presents with  . Hallucinations  . Ingestion    (Consider location/radiation/quality/duration/timing/severity/associated sxs/prior treatment) HPI  Patient presents to emergency department EMS from her home with vague complaints. Per EMS patient called them to her home and told them that she felt a crawling sensation all over her body, had thoughts of people trying to poison her with rat poison, and thoughts of demons chasing her. She told them this all started after she was smoking weed. However upon my evaluation patient just keeps saying "there's something wrong with me, there is something wrong with me." When I ask her about the report given by EMS she just states "no, no I  don't know where that's coming from." She denies any illicit drug use but states she does drink and had alcohol this morning. A level V caveat applies due to patient's altered mental status questioning psychosis. Patient is unable to give a clear history because she does not have a clear train of thought. She'll begin to answer questions and then will deviate from the questioning. Patient states "I keep getting these muscle spasms and I will talk and forget, I'll just forget" but then she is unable to clarify the complaint. She will not describe time of onset or give any more details. She is alert and oriented. No acute distress.  Past Medical History  Diagnosis Date  . History of concussion   . Depression     Past Surgical History  Procedure Date  . Wisdom tooth extraction 2007    Family History  Problem Relation Age of Onset  . Hypertension Other   . Diabetes Other     History  Substance Use Topics  . Smoking status: Current Everyday Smoker -- 1.0 packs/day for 9 years    Types: Cigarettes  . Smokeless tobacco: Never Used  . Alcohol Use: 3.5 oz/week   1 Drinks containing 0.5 oz of alcohol, 5 Cans of beer per week     daily    OB History    Grav Para Term Preterm Abortions TAB SAB Ect Mult Living                  Review of Systems  Unable to perform ROS   Allergies  Review of patient's allergies indicates no known allergies.  Home Medications   Current Outpatient Rx  Name Route Sig Dispense Refill  . ALBUTEROL SULFATE HFA 108 (90 BASE) MCG/ACT IN AERS Inhalation Inhale 2 puffs into the lungs every 4 (four) hours as needed for wheezing (Or cough). 1 Inhaler 0  . ARIPIPRAZOLE 2 MG PO TABS Oral Take 1 tablet (2 mg total) by mouth daily. For mood stability 60 tablet 0  . CITALOPRAM HYDROBROMIDE 10 MG PO TABS Oral Take 1 tablet (10 mg total) by mouth daily. For depression and anxiety 30 tablet 0  . CLOTRIMAZOLE 1 % VA CREA  Apply up to twice daily as needed for itching/yeast infection, for next 3 days.    . TRAZODONE HCL 150 MG PO TABS Oral Take 1 tablet (150 mg total) by mouth at bedtime. For sleep 30 tablet 0    BP 118/89  Pulse 84  Temp(Src) 98.2 F (36.8 C) (Oral)  Resp 17  Ht 5\' 6"  (1.676 m)  Wt 130 lb (58.968 kg)  BMI 20.98 kg/m2  SpO2 100%  LMP 10/12/2011  Physical Exam  Nursing note and vitals reviewed. Constitutional: She is oriented to person, place, and time. She appears well-developed and well-nourished. No distress.       tearful  HENT:  Head: Normocephalic and atraumatic.  Eyes: Conjunctivae and EOM are normal. Pupils are equal, round, and reactive to light.  Neck: Normal range of motion. Neck supple.  Cardiovascular: Normal rate, regular rhythm, normal heart sounds and intact distal pulses.  Exam reveals no gallop and no friction rub.   No murmur heard. Pulmonary/Chest: Effort normal and breath sounds normal. No respiratory distress. She has no wheezes. She has no rales. She exhibits no tenderness.  Abdominal: Soft. Bowel sounds are normal. She exhibits no distension and no mass. There is no tenderness.  There is no rebound and no guarding.  Musculoskeletal: Normal range of motion. She exhibits no edema and no tenderness.  Neurological: She is alert and oriented to person, place, and time. No cranial nerve deficit. Coordination normal.  Skin: Skin is warm and dry. No rash noted. She is not diaphoretic. No erythema.  Psychiatric: Her mood appears anxious. Her speech is rapid and/or pressured. She is agitated. Cognition and memory are impaired. She expresses impulsivity.    ED Course  Procedures (including critical care time)  PO ativan  Labs Reviewed  COMPREHENSIVE METABOLIC PANEL - Abnormal; Notable for the following:    GFR calc non Af Amer 81 (*)    All other components within normal limits  URINE RAPID DRUG SCREEN (HOSP PERFORMED) - Abnormal; Notable for the following:    Cocaine POSITIVE (*)    Tetrahydrocannabinol POSITIVE (*)    All other components within normal limits  SALICYLATE LEVEL - Abnormal; Notable for the following:    Salicylate Lvl <2.0 (*)    All other components within normal limits  CBC  ETHANOL  ACETAMINOPHEN LEVEL  POCT PREGNANCY, URINE   Ct Head Wo Contrast  11/04/2011  *RADIOLOGY REPORT*  Clinical Data: Mental status changes.  CT HEAD WITHOUT CONTRAST  Technique:  Contiguous axial images were obtained from the base of the skull through the vertex without contrast.  Comparison: 01/19/2011.  Findings: The ventricles are normal.  No extra-axial fluid collections are seen.  The brainstem and cerebellum are unremarkable.  No acute intracranial findings such as infarction or hemorrhage.  No mass lesions.  The bony calvarium is intact.  The visualized paranasal sinuses and mastoid air cells are clear.  IMPRESSION: No acute intracranial findings or mass lesions.  No change since prior study.  Original Report Authenticated By: P. Loralie Champagne, M.D.     1. Polysubstance abuse   2. Hallucinations   3. Paranoia       MDM  Temp psych orders written with ACT team  evaluation and tele psych consult ordered. Agitation has improved. She is resting comfortably. dispo pending psych evluatuion but will likely be inpatient for questionable psychosis. No acute findings on labs or head CT to suggest organic cause of AMS or delirium         Drucie Opitz, PA 11/05/11 0154

## 2011-11-05 DIAGNOSIS — F1210 Cannabis abuse, uncomplicated: Secondary | ICD-10-CM

## 2011-11-05 DIAGNOSIS — F1410 Cocaine abuse, uncomplicated: Secondary | ICD-10-CM

## 2011-11-05 DIAGNOSIS — F1910 Other psychoactive substance abuse, uncomplicated: Secondary | ICD-10-CM

## 2011-11-05 MED ORDER — ZIPRASIDONE MESYLATE 20 MG IM SOLR
INTRAMUSCULAR | Status: AC
  Administered 2011-11-05: 20 mg via INTRAMUSCULAR
  Filled 2011-11-05: qty 20

## 2011-11-05 MED ORDER — ZIPRASIDONE MESYLATE 20 MG IM SOLR
20.0000 mg | Freq: Once | INTRAMUSCULAR | Status: AC
  Administered 2011-11-05: 20 mg via INTRAMUSCULAR

## 2011-11-05 NOTE — BHH Counselor (Signed)
Writer with aide of Nurse Tech were unable to keep pt awake long enough to do assessment. ACT will try to assess later today.

## 2011-11-05 NOTE — BHH Counselor (Signed)
Pt unable to be woken up. Writer will try to assess later this am.
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
## 2011-11-05 NOTE — ED Notes (Signed)
Report received-airway intact-no s/s's of distress-will continue to monitor 

## 2011-11-05 NOTE — ED Notes (Signed)
Pt awake, asked to use phone. Phone given to pt, started walking away to room. Kathlene November, RN asked pt to stay in front of nurses station - pt told him to "shut up". Phone disconnected, pt became upset and went into room. Security called. Velna Hatchet, RN attempted to take vitals in pt room, pt refused. Pt pacing back and forth in front of nurses station eating food from dinner tray. Pt asked to go back into room to eat - became agitated, threw tray on floor.

## 2011-11-05 NOTE — ED Notes (Signed)
Geodon 20 mg IM given x 1 orders not linking in the computer at 1135.
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
## 2011-11-05 NOTE — Consult Note (Signed)
Reason for Consult: Polysubstance abuse versus dependence Referring Physician: Dr. Acey Lav is an 26 y.o. female.  HPI: Patient was seen and chart reviewed. [facility] was [facility] about [facility] ago for the substance abuse versus dependence. Patient supposed to be following up with the [facility]. Patient was relapsed on her drugs of abuse which is an obese and cocaine. Patient was brought in by emergency medical services and patient called them and told them. She has set, tactile hallucinations, and the paranoia about people are trying to poison her and thoughts of demons chasing her. The patient has bizarre behavior on arrival, who and required restraints for altered mental status with the psychotic symptoms and patient received medication Geodon 20 mg intramuscular twice since arrival and Ativan 1 mg by mouth..  Patient was unable to participate in this evaluation. She has been sedate under sedation.  Past Medical History  Diagnosis Date  . History of concussion   . Depression     Past Surgical History  Procedure Date  . Wisdom tooth extraction 2007    Family History  Problem Relation Age of Onset  . Hypertension Other   . Diabetes Other     Social History:  reports that she has been smoking Cigarettes.  She has a 9 pack-year smoking history. She has never used smokeless tobacco. She reports that she drinks about 3.5 ounces of alcohol per week. She reports that she uses illicit drugs (Marijuana and Cocaine) about once per week.  Allergies: No Known Allergies  Medications: I have reviewed the patient's current medications.  Results for orders placed during the hospital encounter of 11/04/11 (from the past 48 hour(s))  CBC     Status: Normal   Collection Time   11/04/11  9:56 PM      Component Value Range Comment   WBC 8.9  4.0 - 10.5 (K/uL)    RBC 4.23  3.87 - 5.11 (MIL/uL)    Hemoglobin 13.4  12.0 - 15.0 (g/dL)    HCT 16.1  09.6 - 04.5 (%)    MCV 91.7  78.0 - 100.0 (fL)    MCH 31.7  26.0 - 34.0 (pg)    MCHC 34.5  30.0 - 36.0 (g/dL)    RDW 40.9  81.1 - 91.4 (%)    Platelets 316  150 - 400 (K/uL)   COMPREHENSIVE METABOLIC PANEL     Status: Abnormal   Collection Time   11/04/11  9:56 PM      Component Value Range Comment   Sodium 135  135 - 145 (mEq/L)    Potassium 3.8  3.5 - 5.1 (mEq/L)    Chloride 100  96 - 112 (mEq/L)    CO2 25  19 - 32 (mEq/L)    Glucose, Bld 92  70 - 99 (mg/dL)    BUN 9  6 - 23 (mg/dL)    Creatinine, Ser 7.82  0.50 - 1.10 (mg/dL)    Calcium 9.5  8.4 - 10.5 (mg/dL)    Total Protein 7.3  6.0 - 8.3 (g/dL)    Albumin 4.0  3.5 - 5.2 (g/dL)    AST 18  0 - 37 (U/L)    ALT 10  0 - 35 (U/L)    Alkaline Phosphatase 68  39 - 117 (U/L)    Total Bilirubin 0.4  0.3 - 1.2 (mg/dL)    GFR calc non Af Amer 81 (*) >  90 (mL/min)    GFR calc Af Amer >90  >90 (mL/min)   ETHANOL     Status: Normal   Collection Time   11/04/11  9:56 PM      Component Value Range Comment   Alcohol, Ethyl (B) <11  0 - 11 (mg/dL)   ACETAMINOPHEN LEVEL     Status: Normal   Collection Time   11/04/11  9:56 PM      Component Value Range Comment   Acetaminophen (Tylenol), Serum <15.0  10 - 30 (ug/mL)   SALICYLATE LEVEL     Status: Abnormal   Collection Time   11/04/11  9:56 PM      Component Value Range Comment   Salicylate Lvl <2.0 (*) 2.8 - 20.0 (mg/dL)   URINE RAPID DRUG SCREEN (HOSP PERFORMED)     Status: Abnormal   Collection Time   11/04/11 10:29 PM      Component Value Range Comment   Opiates NONE DETECTED  NONE DETECTED     Cocaine POSITIVE (*) NONE DETECTED     Benzodiazepines NONE DETECTED  NONE DETECTED     Amphetamines NONE DETECTED  NONE DETECTED     Tetrahydrocannabinol POSITIVE (*) NONE DETECTED     Barbiturates NONE DETECTED  NONE DETECTED    POCT PREGNANCY, URINE     Status: Normal   Collection Time   11/04/11 10:44 PM      Component Value Range Comment    Preg Test, Ur NEGATIVE  NEGATIVE      Ct Head Wo Contrast  11/04/2011  *RADIOLOGY REPORT*  Clinical Data: Mental status changes.  CT HEAD WITHOUT CONTRAST  Technique:  Contiguous axial images were obtained from the base of the skull through the vertex without contrast.  Comparison: 01/19/2011.  Findings: The ventricles are normal.  No extra-axial fluid collections are seen.  The brainstem and cerebellum are unremarkable.  No acute intracranial findings such as infarction or hemorrhage.  No mass lesions.  The bony calvarium is intact.  The visualized paranasal sinuses and mastoid air cells are clear.  IMPRESSION: No acute intracranial findings or mass lesions.  No change since prior study.  Original Report Authenticated By: P. Loralie Champagne, M.D.    Positive for illegal drug usage Blood pressure 82/53, pulse 63, temperature 98.8 F (37.1 C), temperature source Oral, resp. rate 18, height 5\' 6"  (1.676 m), weight 130 lb (58.968 kg), last menstrual period 10/12/2011, SpO2 99.00%.   Assessment/Plan: Polysubstance abuse versus dependence Marijuana dependence and cocaine abuse.  Recommended acute psychiatric hospitalization for safety and states therapeutic milieu. Patient may need substance abuse and detox and rehabilitation services to prevent further abuse and dependence.  Baird Polinski,JANARDHAHA R. 11/05/2011, 4:05 PM

## 2011-11-05 NOTE — ED Notes (Signed)
Patient accepted [facility]. [place] [employer] to escort patient. Patient in no distress, but refused vital signs.

## 2011-11-07 NOTE — ED Provider Notes (Signed)
Medical screening examination/treatment/procedure(s) were conducted as a shared visit with non-physician practitioner(s) and myself.  I personally evaluated the patient during the encounter  Toy Baker, MD 11/07/11 [phone]

## 2011-11-22 ENCOUNTER — Encounter: Payer: Self-pay | Admitting: Advanced Practice Midwife

## 2012-02-13 ENCOUNTER — Encounter (HOSPITAL_COMMUNITY): Payer: Self-pay | Admitting: Emergency Medicine

## 2012-02-13 ENCOUNTER — Emergency Department (HOSPITAL_COMMUNITY)
Admission: EM | Admit: 2012-02-13 | Discharge: 2012-02-14 | Disposition: A | Discharge status: Home / Self Care | Payer: Medicaid Other | Attending: Emergency Medicine | Admitting: Emergency Medicine

## 2012-02-13 DIAGNOSIS — F1910 Other psychoactive substance abuse, uncomplicated: Secondary | ICD-10-CM

## 2012-02-13 HISTORY — DX: Cannabis dependence, uncomplicated: F12.20

## 2012-02-13 HISTORY — DX: Alcohol abuse, uncomplicated: F10.10

## 2012-02-13 HISTORY — DX: Essential (primary) hypertension: I10

## 2012-02-13 HISTORY — DX: Cocaine dependence, uncomplicated: F14.20

## 2012-02-13 LAB — COMPREHENSIVE METABOLIC PANEL
ALT: 9 U/L (ref 0–35)
AST: 18 U/L (ref 0–37)
Albumin: 4 g/dL (ref 3.5–5.2)
Alkaline Phosphatase: 66 U/L (ref 39–117)
CO2: 25 mEq/L (ref 19–32)
Chloride: 101 mEq/L (ref 96–112)
GFR calc non Af Amer: 87 mL/min — ABNORMAL LOW (ref >90)
Potassium: 3.8 mEq/L (ref 3.5–5.1)
Sodium: 135 mEq/L (ref 135–145)
Total Bilirubin: 0.5 mg/dL (ref 0.3–1.2)

## 2012-02-13 LAB — RAPID URINE DRUG SCREEN, HOSP PERFORMED
Barbiturates: NOT DETECTED
Benzodiazepines: NOT DETECTED
Tetrahydrocannabinol: POSITIVE — AB

## 2012-02-13 LAB — CBC WITH DIFFERENTIAL/PLATELET
Basophils Absolute: 0.1 10*3/uL (ref 0.0–0.1)
Basophils Relative: 1 % (ref 0–1)
HCT: 41.2 % (ref 36.0–46.0)
Lymphocytes Relative: 44 % (ref 12–46)
MCHC: 34.5 g/dL (ref 30.0–36.0)
Monocytes Absolute: 0.6 10*3/uL (ref 0.1–1.0)
Neutro Abs: 3.8 10*3/uL (ref 1.7–7.7)
Neutrophils Relative %: 47 % (ref 43–77)
RDW: 14.4 % (ref 11.5–15.5)
WBC: 8.2 10*3/uL (ref 4.0–10.5)

## 2012-02-13 MED ORDER — IBUPROFEN 600 MG PO TABS: 600.0000 mg | ORAL_TABLET | Freq: Three times a day (TID) | ORAL | Status: DC | PRN

## 2012-02-13 MED ORDER — ZOLPIDEM TARTRATE 5 MG PO TABS
5.0000 mg | ORAL_TABLET | Freq: Every evening | ORAL | Status: DC | PRN
  Administered 2012-02-13: 5 mg via ORAL
  Filled 2012-02-13: qty 1

## 2012-02-13 MED ORDER — NICOTINE 21 MG/24HR TD PT24: 21.0000 mg | MEDICATED_PATCH | Freq: Every day | TRANSDERMAL | Status: DC

## 2012-02-13 MED ORDER — ACETAMINOPHEN 325 MG PO TABS: 650.0000 mg | ORAL_TABLET | ORAL | Status: DC | PRN

## 2012-02-13 MED ORDER — ALUM & MAG HYDROXIDE-SIMETH 200-200-20 MG/5ML PO SUSP: 30.0000 mL | ORAL | Status: DC | PRN

## 2012-02-13 MED ORDER — LORAZEPAM 1 MG PO TABS: 1.0000 mg | ORAL_TABLET | Freq: Three times a day (TID) | ORAL | Status: DC | PRN

## 2012-02-13 MED ORDER — ONDANSETRON HCL 4 MG PO TABS: 4.0000 mg | ORAL_TABLET | Freq: Three times a day (TID) | ORAL | Status: DC | PRN

## 2012-02-13 NOTE — ED Provider Notes (Addendum)
History     CSN: [id number]  Arrival date & time 02/13/12  [id number]   First MD Initiated Contact with Patient 02/13/12 [phone]      Chief Complaint  Patient presents with  . Medical Clearance  . detox     (Consider location/radiation/quality/duration/timing/severity/associated sxs/prior treatment) The history is provided by the patient.   Patient here complaining of addiction to alcohol, cocaine. No Hx a 12 pack of beer a day. He uses cocaine daily. Denies any suicidal homicidal ideations. Has been in rehabilitation before and states that she used to and [facility] but was only able to stay sober 5 hours after leaving rehabilitation. Denies any auditory or visual hallucinations. States that she is frustrated about her current addictions. No other complaints of Past Medical History  Diagnosis Date  . History of concussion   . Depression   . Addiction, marijuana   . Cocaine addiction   . Alcohol abuse   . Hypertension     does not take any meds    Past Surgical History  Procedure Date  . Wisdom tooth extraction 2007    Family History  Problem Relation Age of Onset  . Hypertension Other   . Diabetes Other     History  Substance Use Topics  . Smoking status: Current Every Day Smoker -- 1.0 packs/day for 9 years    Types: Cigarettes  . Smokeless tobacco: Never Used  . Alcohol Use: 7.7 oz/week    1 Drinks containing 0.5 oz of alcohol, 12 Cans of beer per week     daily    OB History    Grav Para Term Preterm Abortions TAB SAB Ect Mult Living                  Review of Systems  All other systems reviewed and are negative.    Allergies  Review of patient's allergies indicates no known allergies.  Home Medications   Current Outpatient Rx  Name Route Sig Dispense Refill  . ARIPIPRAZOLE 2 MG PO TABS Oral Take 1 tablet (2 mg total) by mouth daily. For mood stability 60 tablet 0  . CLOTRIMAZOLE 1 % VA CREA  Apply up to twice daily as needed for itching/yeast  infection, for next 3 days.    . TRAZODONE HCL 150 MG PO TABS Oral Take 1 tablet (150 mg total) by mouth at bedtime. For sleep 30 tablet 0    BP 111/73  Pulse 53  Temp 97.8 F (36.6 C) (Oral)  Resp 16  SpO2 100%  LMP 01/23/2012  Physical Exam  Nursing note and vitals reviewed. Constitutional: She is oriented to person, place, and time. She appears well-developed and well-nourished.  Non-toxic appearance. No distress.  HENT:  Head: Normocephalic and atraumatic.  Eyes: Conjunctivae normal, EOM and lids are normal. Pupils are equal, round, and reactive to light.  Neck: Normal range of motion. Neck supple. No tracheal deviation present. No mass present.  Cardiovascular: Normal rate, regular rhythm and normal heart sounds.  Exam reveals no gallop.   No murmur heard. Pulmonary/Chest: Effort normal and breath sounds normal. No stridor. No respiratory distress. She has no decreased breath sounds. She has no wheezes. She has no rhonchi. She has no rales.  Abdominal: Soft. Normal appearance and bowel sounds are normal. She exhibits no distension. There is no tenderness. There is no rebound and no CVA tenderness.  Musculoskeletal: Normal range of motion. She exhibits no edema and no tenderness.  Neurological:  She is alert and oriented to person, place, and time. She has normal strength. No cranial nerve deficit or sensory deficit. GCS eye subscore is 4. GCS verbal subscore is 5. GCS motor subscore is 6.  Skin: Skin is warm and dry. No abrasion and no rash noted.  Psychiatric: Her speech is normal. Her affect is blunt. She is not actively hallucinating. Thought content is not delusional. She expresses no homicidal and no suicidal ideation. She expresses no suicidal plans and no homicidal plans.    ED Course  Procedures (including critical care time)   Labs Reviewed  URINE RAPID DRUG SCREEN (HOSP PERFORMED)  CBC WITH DIFFERENTIAL  COMPREHENSIVE METABOLIC PANEL  ETHANOL   No results  found.   No diagnosis found.    MDM  Patient to be medically cleared and then evaluated by the behavior health team   1:40 PM Patient now requesting discharge 3:17 PM Patient has changed her mind and is now requesting admission   Toy Baker, MD 02/13/12 1610  Toy Baker, MD 02/13/12 1340  Toy Baker, MD 02/13/12 1517

## 2012-02-13 NOTE — ED Notes (Signed)
While entering another pt's room, pt stated "I want to leave"; informed pt will speak to MD; pt verbalized understanding, Dr. Freida Busman aware.

## 2012-02-13 NOTE — ED Notes (Signed)
Report given to Elaine, RN

## 2012-02-13 NOTE — ED Notes (Signed)
Pt. 2 belonging bags are locked in locker 37.

## 2012-02-13 NOTE — ED Notes (Signed)
Awaiting pt's arrival.

## 2012-02-13 NOTE — ED Notes (Signed)
Wants detox from cocaine marijauna and ETOH, last detoxed 2-3 months ago at [facility]-- stayed 25 days. Last used yesterday. Denies suicidal ideations, states isn't progressing as fast as she wants, states is "on edge"

## 2012-02-13 NOTE — ED Notes (Addendum)
Pt now stating "can't do what my mom wants me to do, was @ [facility] but I got kicked out, I'll stay if you give me my medicines, haven't taken them in over a month, don't even know what the celexa is for, I even gave my mother my food stamp card with a letter so she would know I was serious, I have an 27 y/o daughter my mom takes care of, my mom works up on the floor as a CNA"; informed pt will tell Dr. Freida Busman of her wish to stay; Dr. Freida Busman informed pt stated "will stay if I get my meds"; Dr. Freida Busman informed of pt's statement, per Dr. Freida Busman "tell her I'm not going to restart those meds here and find out if she is still going to stay"; pt informed & states "I will stay"; Dr. Freida Busman informed.

## 2012-02-13 NOTE — ED Notes (Signed)
Pt. Has 2 belongings bag. She has a pair of pants, shirt, shorts and pink Nike tennis shoes. She has jewelry in a cup. White metal necklace, and bead bracelets. She also has a gray and black back pack.

## 2012-02-14 DIAGNOSIS — F1910 Other psychoactive substance abuse, uncomplicated: Secondary | ICD-10-CM
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
## 2012-02-14 NOTE — ED Provider Notes (Signed)
Pt sleeping comfortably. States still wants detox. Has been through detox programs multiple times. States she last drank 'last evening' although she has been here almost 24hrs and EtOH neg on arrival (as well as all recent previous visits). Will await recommendations from Dr. Shela Commons. No current signs of withdrawal, DTs, etc.   Bonnita Levan. Bernette Mayers, MD 02/14/12 [phone]

## 2012-02-14 NOTE — ED Provider Notes (Signed)
Patient seen by psychiatry. They cleared her for discharge. Patient reported to behavioral health team member that she intended to check right back and as she wanted to be at behavioral health. Patient denies suicidal or homicidal ideation. She denied any symptoms that would merit admission or IVC today. Patient then requested to be discharged as her ride was here. Patient was discharged in good condition.  Cyndra Numbers, MD 02/14/12 [phone]

## 2012-02-14 NOTE — BH Assessment (Signed)
Assessment Note     Pt evaluated by psychiatrist-Dr. Oneta Rack and he recommends discharge with out-pt community resources. Writer provided patient with the resources needed to [place] and other individual therapist/psychiatrist in the community. Patient stated, "I don't know why that psychiatrist is letting me go I told him I want to go to [facility]". Pt also sts, "Fine I will walk out the door and come right back in so I can be admitted to [facility]". Writer asked patient if she had any current concerns or why she felt like she shouldn't be discharged. Patient began to speak of multiple psychosocial issues (no ID, no money, unemployed, etc.). Patient was then asked if she was suicidal, homicidal, or experiencing any psychotic symptoms. Pt answered, "No" to all symptoms. Patient did admit to depression and substance abuse (cocaine and THC). Writer explained to patient that [facility] would not accept her for depression, cocaine, or THC use. Patient was encouraged to follow up with the community referrals given. Patient stated, "Fine I will leave let me make a call".  Dr. Alto Denver was notified of all the noted information noted above.

## 2012-02-14 NOTE — Progress Notes (Signed)
Spoke with chaplain while rounding prior to [facility] ED group.   Pt described fear around detox process - stating she feels she always "hits a glitch."  Reported having detoxed before and when transferred to rehab, pt is not able to contact anyone outside to search for job.  She is discharged from rehab and finds herself without employment, ID, etc.   Pt worried about going through detox process again and not able to have social support she needs.   Pt reported not currently having ID.   Pt interested in working to find resources for finding employment post-rehab before she enters rehab program.   Chaplain provided support for pt around admission, fear and anxiety d/t financial concerns, detox process.   Belva Crome  MDiv, Chaplain

## 2012-02-14 NOTE — ED Notes (Signed)
Report received from Night Rn. First contact with patient. Pt alert, oriented, sts here for detox, denied any needs at this time. Will continue to monitor. Pt been assessed.
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
## 2012-05-09 ENCOUNTER — Encounter (HOSPITAL_COMMUNITY): Payer: Self-pay | Admitting: *Deleted

## 2012-05-09 ENCOUNTER — Inpatient Hospital Stay (HOSPITAL_COMMUNITY): Payer: Self-pay

## 2012-05-09 ENCOUNTER — Inpatient Hospital Stay (HOSPITAL_COMMUNITY)
Admission: AD | Admit: 2012-05-09 | Discharge: 2012-05-09 | Disposition: A | Discharge status: Home / Self Care | Payer: Medicaid Other | Source: Ambulatory Visit | Attending: Obstetrics & Gynecology | Admitting: Obstetrics & Gynecology

## 2012-05-09 DIAGNOSIS — O034 Incomplete spontaneous abortion without complication: Secondary | ICD-10-CM

## 2012-05-09 LAB — CBC
HCT: 33.5 % — ABNORMAL LOW (ref 36.0–46.0)
Hemoglobin: 11.2 g/dL — ABNORMAL LOW (ref 12.0–15.0)
RDW: 14.2 % (ref 11.5–15.5)
WBC: 9 10*3/uL (ref 4.0–10.5)

## 2012-05-09 LAB — HCG, QUANTITATIVE, PREGNANCY: hCG, Beta Chain, Quant, S: 28924 m[IU]/mL — ABNORMAL HIGH (ref <5)

## 2012-05-09 LAB — ABO/RH: ABO/RH(D): O POS

## 2012-05-09 LAB — URINALYSIS, ROUTINE W REFLEX MICROSCOPIC
Bilirubin Urine: NEGATIVE
Glucose, UA: 250 mg/dL — AB
Protein, ur: >300 mg/dL — AB
Urobilinogen, UA: 4 mg/dL — ABNORMAL HIGH (ref 0.0–1.0)

## 2012-05-09 LAB — URINE MICROSCOPIC-ADD ON

## 2012-05-09 NOTE — MAU Note (Signed)
Patient states she missed her period in November. Started having bleeding this am and cramping. Patient is not sure if this is her period or if she is pregnant. Bleeding is different than she usually has with her periods and she states the smell is different.

## 2012-05-09 NOTE — MAU Provider Note (Signed)
History     CSN: [id number]  Arrival date and time: 05/09/12 1257   First Provider Initiated Contact with Patient 05/09/12 1530      Chief Complaint  Patient presents with  . Possible Pregnancy  . Vaginal Bleeding  . Abdominal Pain   HPI Comments: Tiffany [name] is a G3P0011 26 y.o. female who presents today for vaginal bleeding and abdominal pain/cramping. Patient is a poor historian. Can not verify her LMP today. She reports a 3 day history of light vaginal bleeding with pinkish brownish blood. She thought this was her period starting, but it realized after 3 days it is not. She passed a baseball size clot this morning while urinating. Notes an odor to the bleeding. Also reports dull cramping that started yesterday and has intensified this morning. Last intercourse was 2 days ago, was not painful. Has had pain with intercourse over the past month. Also notes increased urinary urgency and frequency. Reports feeling tired over the past month. Denies fever, chills, N/V, or back pain.   Vaginal Bleeding Associated symptoms include abdominal pain, frequency and urgency. Pertinent negatives include no chills, constipation, diarrhea, dysuria, fever, hematuria, nausea or vomiting.  Abdominal Pain Associated symptoms include frequency. Pertinent negatives include no constipation, diarrhea, dysuria, fever, hematuria, nausea or vomiting.      Past Medical History  Diagnosis Date  . History of concussion   . Depression   . Addiction, marijuana   . Cocaine addiction   . Alcohol abuse   . Hypertension     does not take any meds    Past Surgical History  Procedure Date  . Wisdom tooth extraction 2007    Family History  Problem Relation Age of Onset  . Hypertension Other   . Diabetes Other     History  Substance Use Topics  . Smoking status: Current Every Day Smoker -- 1.0 packs/day for 9 years    Types: Cigarettes  . Smokeless tobacco: Never Used  . Alcohol Use: 7.7 oz/week      12 Cans of beer, 1 Drinks containing 0.5 oz of alcohol per week     Comment: daily    Allergies: No Known Allergies  No prescriptions prior to admission    Review of Systems  Constitutional: Positive for malaise/fatigue. Negative for fever and chills.  Gastrointestinal: Positive for abdominal pain. Negative for heartburn, nausea, vomiting, diarrhea and constipation.  Genitourinary: Positive for urgency, frequency and vaginal bleeding. Negative for dysuria and hematuria.  Neurological: Positive for dizziness.   Physical Exam   Blood pressure 102/53, pulse 67, temperature 97.1 F (36.2 C), temperature source Oral, resp. rate 20, height 5' 7.5" (1.715 m), weight 57.153 kg (126 lb), SpO2 100.00%.  Physical Exam  Nursing note and vitals reviewed. Constitutional: She is oriented to person, place, and time. She appears well-developed. No distress.       Thin female, appearing a little "out of it", very sleepy   GI: Soft. She exhibits no distension. There is tenderness (right lower quadrant/suprapubic tenderness to palpation).  Genitourinary: Uterus is not enlarged and not tender. Right adnexum displays no tenderness. Left adnexum displays no tenderness.       Moderate amount of blood in the vaginal vault. Moderate amount of dime to half dollar sized clots in the vaginal vault and coming from the cervical opening. Cervix appeared pink. Cervical os appears open, about 1 cm. No other vaginal discharge noted. No CMT, no left adnexal tenderness.  Uterus is nontender and does not  appear enlarged.  Neurological: She is alert and oriented to person, place, and time.  Skin: Skin is warm and dry.     Results for orders placed during the hospital encounter of 05/09/12 (from the past 24 hour(s))  URINALYSIS, ROUTINE W REFLEX MICROSCOPIC     Status: Abnormal   Collection Time   05/09/12  1:25 PM      Component Value Range   Color, Urine RED (*) YELLOW   APPearance CLOUDY (*) CLEAR    Specific Gravity, Urine 1.020  1.005 - 1.030   pH 6.5  5.0 - 8.0   Glucose, UA 250 (*) NEGATIVE mg/dL   Hgb urine dipstick LARGE (*) NEGATIVE   Bilirubin Urine NEGATIVE  NEGATIVE   Ketones, ur 15 (*) NEGATIVE mg/dL   Protein, ur >161 (*) NEGATIVE mg/dL   Urobilinogen, UA 4.0 (*) 0.0 - 1.0 mg/dL   Nitrite POSITIVE (*) NEGATIVE   Leukocytes, UA MODERATE (*) NEGATIVE  URINE MICROSCOPIC-ADD ON     Status: Normal   Collection Time   05/09/12  1:25 PM      Component Value Range   WBC, UA    <3 WBC/hpf   Value: NO FORMED ELEMENTS SEEN ON URINE MICROSCOPIC EXAMINATION   RBC / HPF TOO NUMEROUS TO COUNT  <3 RBC/hpf   Urine-Other MICROSCOPIC EXAM PERFORMED ON UNCONCENTRATED URINE    POCT PREGNANCY, URINE     Status: Abnormal   Collection Time   05/09/12  1:42 PM      Component Value Range   Preg Test, Ur POSITIVE (*) NEGATIVE  ABO/RH     Status: Normal (Preliminary result)   Collection Time   05/09/12  1:57 PM      Component Value Range   ABO/RH(D) O POS    CBC     Status: Abnormal   Collection Time   05/09/12  1:58 PM      Component Value Range   WBC 9.0  4.0 - 10.5 K/uL   RBC 3.59 (*) 3.87 - 5.11 MIL/uL   Hemoglobin 11.2 (*) 12.0 - 15.0 g/dL   HCT 09.6 (*) 04.5 - 40.9 %   MCV 93.3  78.0 - 100.0 fL   MCH 31.2  26.0 - 34.0 pg   MCHC 33.4  30.0 - 36.0 g/dL   RDW 81.1  91.4 - 78.2 %   Platelets 275  150 - 400 K/uL  HCG, QUANTITATIVE, PREGNANCY     Status: Abnormal   Collection Time   05/09/12  1:59 PM      Component Value Range   hCG, Beta Chain, Quant, S 28924 (*) <5 mIU/mL     MAU Course  Procedures 1. Speculum Exam 2. Bimanual Exam   Assessment and Plan  Tiffany [name] is a G3P0011 26 y.o. female with:  1. Incomplete miscarriage, IUP not verified       Follow-up Information    Follow up with [facility]. On 05/11/2012. (for bloodwork or as needed if symptoms worsen)    Contact information:   [address] [id number] mc [place] [address] [phone]          Medication List     As of 05/09/2012  4:49 PM    CONTINUE taking these medications         ARIPiprazole 2 MG tablet   Commonly known as: ABILIFY   Take 1 tablet (2 mg total) by mouth daily. For mood stability  traZODone 150 MG tablet   Commonly known as: DESYREL   Take 1 tablet (150 mg total) by mouth at bedtime. For sleep             Marnee Spring 05/09/2012, 4:49 PM

## 2012-05-12 LAB — GC/CHLAMYDIA PROBE AMP
CT Probe RNA: POSITIVE — AB
GC Probe RNA: NEGATIVE

## 2012-05-13 ENCOUNTER — Other Ambulatory Visit: Payer: Self-pay | Admitting: Advanced Practice Midwife

## 2012-05-13 DIAGNOSIS — O034 Incomplete spontaneous abortion without complication: Secondary | ICD-10-CM

## 2012-05-13 NOTE — Progress Notes (Signed)
Seen in MAU 05/09/12, Dx ? Incomplete SAB. No show for F/U quant in MAU 05/11/12. Chlamydia pos. If she has not already been informed of CT and instructed to get Tx at [facility], she can get Tx at MAU when she returns for quant ASAP.

## 2012-05-14 ENCOUNTER — Encounter (HOSPITAL_COMMUNITY): Payer: Self-pay | Admitting: *Deleted

## 2012-05-14 ENCOUNTER — Inpatient Hospital Stay (HOSPITAL_COMMUNITY)
Admission: AD | Admit: 2012-05-14 | Discharge: 2012-05-14 | Disposition: A | Discharge status: Home / Self Care | Payer: Self-pay | Source: Ambulatory Visit | Attending: Obstetrics and Gynecology | Admitting: Obstetrics and Gynecology

## 2012-05-14 ENCOUNTER — Telehealth (HOSPITAL_COMMUNITY): Payer: Self-pay | Admitting: Advanced Practice Midwife

## 2012-05-14 DIAGNOSIS — A749 Chlamydial infection, unspecified: Secondary | ICD-10-CM

## 2012-05-14 DIAGNOSIS — O035 Genital tract and pelvic infection following complete or unspecified spontaneous abortion: Secondary | ICD-10-CM | POA: Insufficient documentation

## 2012-05-14 DIAGNOSIS — O034 Incomplete spontaneous abortion without complication: Secondary | ICD-10-CM

## 2012-05-14 HISTORY — DX: Chlamydial infection, unspecified: A74.9

## 2012-05-14 LAB — HCG, QUANTITATIVE, PREGNANCY: hCG, Beta Chain, Quant, S: 1535 m[IU]/mL — ABNORMAL HIGH (ref <5)

## 2012-05-14 MED ORDER — KETOROLAC TROMETHAMINE 60 MG/2ML IM SOLN
60.0000 mg | Freq: Once | INTRAMUSCULAR | Status: AC
  Administered 2012-05-14: 60 mg via INTRAMUSCULAR
  Filled 2012-05-14: qty 2

## 2012-05-14 MED ORDER — AZITHROMYCIN 250 MG PO TABS
1000.0000 mg | ORAL_TABLET | Freq: Once | ORAL | Status: AC
  Administered 2012-05-14: 1000 mg via ORAL
  Filled 2012-05-14: qty 4

## 2012-05-14 NOTE — Telephone Encounter (Signed)
Telephone call to patient regarding positive chlamydia culture.  Patient has no phone call to next of kin number and "mailbox is full".  Sent patient email to contact office.  Certified letter mailed.  Patient has not been treated.  Report faxed to health department.

## 2012-05-14 NOTE — MAU Provider Note (Signed)
History     CSN: [id number]  Arrival date and time: 05/14/12 1656   First Provider Initiated Contact with Patient 05/14/12 1743      Chief Complaint  Patient presents with  . Follow-up   HPI Tiffany [name] 26 y.o. Was seen on 05-09-12 and diagnosed with incomplete miscarriage.  Quant was 28,000 and nothing was seen on ultrasound.  Client had been having bleeding with clots and her cervix was open to 1 cm.  Was to return on 05-13-12 for repeat quant but did not come.  Returns today for repeat quant and for treatment for positive chlamydia - attempted to call earlier today -  Certified letter sent..   OB History    Grav Para Term Preterm Abortions TAB SAB Ect Mult Living   3 1   1  1   1       Past Medical History  Diagnosis Date  . History of concussion   . Depression   . Addiction, marijuana   . Cocaine addiction   . Alcohol abuse   . Hypertension     does not take any meds  . Chlamydia     Past Surgical History  Procedure Date  . Wisdom tooth extraction 2007    Family History  Problem Relation Age of Onset  . Hypertension Other   . Diabetes Other     History  Substance Use Topics  . Smoking status: Current Every Day Smoker -- 1.0 packs/day for 9 years    Types: Cigarettes  . Smokeless tobacco: Never Used  . Alcohol Use: 7.7 oz/week    12 Cans of beer, 1 Drinks containing 0.5 oz of alcohol per week     Comment: daily    Allergies: No Known Allergies  Prescriptions prior to admission  Medication Sig Dispense Refill  . ARIPiprazole (ABILIFY) 2 MG tablet Take 1 tablet (2 mg total) by mouth daily. For mood stability  60 tablet  0  . traZODone (DESYREL) 150 MG tablet Take 1 tablet (150 mg total) by mouth at bedtime. For sleep  30 tablet  0    Review of Systems  Constitutional: Negative for fever.  Gastrointestinal: Positive for abdominal pain. Negative for nausea, vomiting, diarrhea and constipation.  Genitourinary: Negative for dysuria.       Vaginal  bleeding.   Physical Exam   Blood pressure 127/66, pulse 85, temperature 98.3 F (36.8 C), temperature source Oral, resp. rate 16, SpO2 100.00%.  Physical Exam  Nursing note and vitals reviewed. Constitutional: She is oriented to person, place, and time. She appears well-developed and well-nourished.  HENT:  Head: Normocephalic.  Eyes: EOM are normal.  Neck: Neck supple.  GI: Soft.       Mild tenderness in low RLQ, especially tender in Right groin as lymph nodes palpated.  Musculoskeletal: Normal range of motion.  Neurological: She is alert and oriented to person, place, and time.  Skin: Skin is warm and dry.  Psychiatric: She has a normal mood and affect.    MAU Course  Procedures Results for orders placed during the hospital encounter of 05/14/12 (from the past 24 hour(s))  HCG, QUANTITATIVE, PREGNANCY     Status: Abnormal   Collection Time   05/14/12  5:20 PM      Component Value Range   hCG, Beta Chain, Quant, S 1535 (*) <5 mIU/mL    MDM Positive chlamydia noted on previous labs done.  Has not been treated.  Will treat in MAU with azithromycin  1 gm PO single dose. Quant has dropped significantly in 5 days.  Miscarriage is the likely diagnosis.  Having some abdominal pain but is related to tender lymph nodes in right groin.  Do not think this is an ectopic pregnancy at all.  Assessment and Plan  Miscarriage in progress Chlamydia  Plan. Azithromycin 1 gm po single dose given for cchlamydia in MAU. Message sent to GYN clinic for followup.  Client gives her minister as the contact number at [phone].  Suspect she is homeless as she is carrying a bag of clothing with her No sex for 10 days. Condoms always with intercourse until you have been seen by the GYN clinic for follow up Refer partners for treatment at the [facility]. You can take Ibuprofen by the package directions for abdominal pain.  Expect the pain to get better as you have been treated for chlamydia  today.  Tiffany [name] 05/14/2012, 5:47 PM

## 2012-05-14 NOTE — MAU Provider Note (Signed)
Attestation of Attending Supervision of Advanced Practitioner: Evaluation and management procedures were performed by the PA/NP/CNM/OB Fellow under my supervision/collaboration. Chart reviewed and agree with management and plan.  Taras Rask V 05/14/2012 7:51 PM

## 2012-05-14 NOTE — MAU Note (Signed)
Patient to MAU for follow up BHCG. Patient has had a positive CT. Patient states she has abdominal pain on the lower to the right abdominal that is constant and continues to have bleeding like a period.

## 2012-05-16 NOTE — MAU Provider Note (Signed)
Attestation of Attending Supervision of Advanced Practitioner (PA/CNM/NP): Evaluation and management procedures were performed by the Advanced Practitioner under my supervision and collaboration.  I have reviewed the Advanced Practitioner's note and chart, and I agree with the management and plan.  Lisset Ketchem, MD, FACOG Attending Obstetrician & Gynecologist Faculty Practice, [facility]  

## 2012-06-19 ENCOUNTER — Encounter: Payer: Self-pay | Admitting: Medical

## 2012-06-23 NOTE — Telephone Encounter (Signed)
Certified letter returned "unclaimed, unable to forward".

## 2013-03-14 ENCOUNTER — Encounter (HOSPITAL_COMMUNITY): Payer: Self-pay | Admitting: *Deleted

## 2013-10-25 ENCOUNTER — Encounter (HOSPITAL_COMMUNITY): Payer: Self-pay | Admitting: Emergency Medicine

## 2013-10-25 ENCOUNTER — Emergency Department (HOSPITAL_COMMUNITY)
Admission: EM | Admit: 2013-10-25 | Discharge: 2013-10-25 | Disposition: A | Discharge status: Home / Self Care | Payer: Medicaid Other | Attending: Emergency Medicine | Admitting: Emergency Medicine

## 2013-10-25 DIAGNOSIS — F17200 Nicotine dependence, unspecified, uncomplicated: Secondary | ICD-10-CM | POA: Insufficient documentation

## 2013-10-25 DIAGNOSIS — I10 Essential (primary) hypertension: Secondary | ICD-10-CM | POA: Insufficient documentation

## 2013-10-25 DIAGNOSIS — Z8619 Personal history of other infectious and parasitic diseases: Secondary | ICD-10-CM | POA: Insufficient documentation

## 2013-10-25 DIAGNOSIS — M542 Cervicalgia: Secondary | ICD-10-CM | POA: Insufficient documentation

## 2013-10-25 DIAGNOSIS — Z87828 Personal history of other (healed) physical injury and trauma: Secondary | ICD-10-CM | POA: Insufficient documentation

## 2013-10-25 DIAGNOSIS — M62838 Other muscle spasm: Secondary | ICD-10-CM | POA: Insufficient documentation

## 2013-10-25 DIAGNOSIS — Z791 Long term (current) use of non-steroidal anti-inflammatories (NSAID): Secondary | ICD-10-CM | POA: Insufficient documentation

## 2013-10-25 DIAGNOSIS — Z8659 Personal history of other mental and behavioral disorders: Secondary | ICD-10-CM | POA: Insufficient documentation

## 2013-10-25 MED ORDER — TRAMADOL HCL 50 MG PO TABS: 50.0000 mg | ORAL_TABLET | Freq: Four times a day (QID) | ORAL | Status: DC | PRN

## 2013-10-25 MED ORDER — TRAMADOL HCL 50 MG PO TABS
50.0000 mg | ORAL_TABLET | Freq: Once | ORAL | Status: AC
  Administered 2013-10-25: 50 mg via ORAL
  Filled 2013-10-25: qty 1

## 2013-10-25 NOTE — ED Notes (Signed)
Pt requested pain med.  Orders obtained from PA.

## 2013-10-25 NOTE — Discharge Instructions (Signed)
Muscle Cramps and Spasms Muscle cramps and spasms occur when a muscle or muscles tighten and you have no control over this tightening (involuntary muscle contraction). They are a common problem and can develop in any muscle. The most common place is in the calf muscles of the leg. Both muscle cramps and muscle spasms are involuntary muscle contractions, but they also have differences:   Muscle cramps are sporadic and painful. They may last a few seconds to a quarter of an hour. Muscle cramps are often more forceful and last longer than muscle spasms.  Muscle spasms may or may not be painful. They may also last just a few seconds or much longer. CAUSES  It is uncommon for cramps or spasms to be due to a serious underlying problem. In many cases, the cause of cramps or spasms is unknown. Some common causes are:   Overexertion.   Overuse from repetitive motions (doing the same thing over and over).   Remaining in a certain position for a long period of time.   Improper preparation, form, or technique while performing a sport or activity.   Dehydration.   Injury.   Side effects of some medicines.   Abnormally low levels of the salts and ions in your blood (electrolytes), especially potassium and calcium. This could happen if you are taking water pills (diuretics) or you are pregnant.  Some underlying medical problems can make it more likely to develop cramps or spasms. These include, but are not limited to:   Diabetes.   Parkinson disease.   Hormone disorders, such as thyroid problems.   Alcohol abuse.   Diseases specific to muscles, joints, and bones.   Blood vessel disease where not enough blood is getting to the muscles.  HOME CARE INSTRUCTIONS   Stay well hydrated. Drink enough water and fluids to keep your urine clear or pale yellow.  It may be helpful to massage, stretch, and relax the affected muscle.  For tight or tense muscles, use a warm towel, heating  pad, or hot shower water directed to the affected area.  If you are sore or have pain after a cramp or spasm, applying ice to the affected area may relieve discomfort.  Put ice in a plastic bag.  Place a towel between your skin and the bag.  Leave the ice on for 15-20 minutes, 03-04 times a day.  Medicines used to treat a known cause of cramps or spasms may help reduce their frequency or severity. Only take over-the-counter or prescription medicines as directed by your caregiver. SEEK MEDICAL CARE IF:  Your cramps or spasms get more severe, more frequent, or do not improve over time.  MAKE SURE YOU:   Understand these instructions.  Will watch your condition.  Will get help right away if you are not doing well or get worse. Document Released: 11/10/2001 Document Revised: 09/15/2012 Document Reviewed: 05/07/2012 [facility] Patient Information 2014 [place], [place].   Muscle Cramps and Spasms Muscle cramps and spasms occur when a muscle or muscles tighten and you have no control over this tightening (involuntary muscle contraction). They are a common problem and can develop in any muscle. The most common place is in the calf muscles of the leg. Both muscle cramps and muscle spasms are involuntary muscle contractions, but they also have differences:   Muscle cramps are sporadic and painful. They may last a few seconds to a quarter of an hour. Muscle cramps are often more forceful and last longer than muscle spasms.  Muscle spasms may or may not be painful. They may also last just a few seconds or much longer. CAUSES  It is uncommon for cramps or spasms to be due to a serious underlying problem. In many cases, the cause of cramps or spasms is unknown. Some common causes are:   Overexertion.   Overuse from repetitive motions (doing the same thing over and over).   Remaining in a certain position for a long period of time.   Improper preparation, form, or technique while performing a  sport or activity.   Dehydration.   Injury.   Side effects of some medicines.   Abnormally low levels of the salts and ions in your blood (electrolytes), especially potassium and calcium. This could happen if you are taking water pills (diuretics) or you are pregnant.  Some underlying medical problems can make it more likely to develop cramps or spasms. These include, but are not limited to:   Diabetes.   Parkinson disease.   Hormone disorders, such as thyroid problems.   Alcohol abuse.   Diseases specific to muscles, joints, and bones.   Blood vessel disease where not enough blood is getting to the muscles.  HOME CARE INSTRUCTIONS   Stay well hydrated. Drink enough water and fluids to keep your urine clear or pale yellow.  It may be helpful to massage, stretch, and relax the affected muscle.  For tight or tense muscles, use a warm towel, heating pad, or hot shower water directed to the affected area.  If you are sore or have pain after a cramp or spasm, applying ice to the affected area may relieve discomfort.  Put ice in a plastic bag.  Place a towel between your skin and the bag.  Leave the ice on for 15-20 minutes, 03-04 times a day.  Medicines used to treat a known cause of cramps or spasms may help reduce their frequency or severity. Only take over-the-counter or prescription medicines as directed by your caregiver. SEEK MEDICAL CARE IF:  Your cramps or spasms get more severe, more frequent, or do not improve over time.  MAKE SURE YOU:   Understand these instructions.  Will watch your condition.  Will get help right away if you are not doing well or get worse. Document Released: 11/10/2001 Document Revised: 09/15/2012 Document Reviewed: 05/07/2012 [facility] Patient Information 2014 [place].  Heat Therapy Heat therapy can help ease achy, tense, stiff, and tight muscles and joints. Heat should not be used on new injuries. Wait at least 48  hours after the injury before using heat therapy. Heat also should not be used for discomfort or pain that occurs right after doing an activity. If you still have pain or stiffness 3 hours after finishing the activity, then heat therapy may be used. PRECAUTIONS  High heat or prolonged exposure to heat can cause burns. Be careful when using heat therapy to avoid burning your skin. If you have any of the following conditions, do not use heat until you have discussed heat therapy with your caregiver:  Poor circulation.  Healing wounds or scarred skin in the area being treated.  Diabetes, heart disease, or high blood pressure.  Numbness of the area being treated.  Unusual swelling of the area being treated.  Active infections.  Blood clots.  Cancer.  Inability to communicate your response to pain. This can include young children and people with dementia. HOME CARE INSTRUCTIONS Moist heat pack  Soak a clean towel in warm water, and squeeze out  the extra water. The water temperature should be comfortable to the skin.  Put the warm, wet towel in a plastic bag.  Place a thin, dry towel between your skin and the bag.  Put the heat pack on the area for 5 minutes, and check your skin. Your skin may be pink, but it should not be red.  Leave the heat pack on the area for a total of 15 to 30 minutes.  Repeat this every 2 to 4 hours while awake. Do not use heat while you are sleeping. Warm water bath  Fill a tub with warm water. The water temperature should be comfortable to the skin.  Place the affected body part in the tub.  Soak the area for 20 to 40 minutes.  Repeat as needed. Hot water bottle  Fill the water bottle half full with hot water.  Press out the extra air. Close the cap tightly.  Place a dry towel between your skin and the bottle.  Put the bottle on the area for 5 minutes, and check your skin. Your skin may be pink, but it should not be red.  Leave the bottle on  the area for a total of 15 to 30 minutes.  Repeat this every 2 to 4 hours while awake. Electric heating pad  Place a dry towel between your skin and the heating pad.  Set the heating pad on low heat.  Put the heating pad on the area for 10 minutes, and check your skin. Your skin may be pink, but it should not be red.  Leave the heating pad on the area for a total of 20 to 40 minutes.  Repeat this every 2 to 4 hours while awake.  Do not lie on the heating pad.  Do not fall asleep while using the heating pad.  Do not use the heating pad near water. Contact with water can result in an electrical shock. SEEK MEDICAL CARE IF:  You have blisters, redness, swelling, or numbness.  You have any new problems.  Your problems are getting worse.  You have any questions or concerns. If you develop any problems, stop using heat therapy until you see your caregiver. MAKE SURE YOU:  Understand these instructions.  Will watch your condition.  Will get help right away if you are not doing well or get worse. Document Released: 08/13/2011 Document Reviewed: 08/13/2011 [facility] Patient Information 2014 [place], [place].

## 2013-10-25 NOTE — ED Provider Notes (Signed)
CSN: [id number]     Arrival date & time 10/25/13  1625 History  This chart was scribed for non-physician practitioner, Arthor CaptainAbigail Malissie Musgrave, PA-C, working with Rolan BuccoMelanie Belfi, MD by Shari HeritageAisha Amuda, ED Scribe. This patient was seen in room TR08C/TR08C and the patient's care was started at 5:39 PM.     Chief Complaint  Patient presents with  . Shoulder Pain    The history is provided by the patient. No language interpreter was used.    HPI Comments: Keenan BachelorJoanna R Erlandson is a 28 y.o. female who presents to the Emergency Department complaining of moderate, constant left posterior neck and shoulder pain onset yesterday morning upon waking. Patient describes pain as spasms. Pain is worse with any movements especially when she turns her head to the right. She has taken Aleve, ibuprofen 600 mg and a half tablet of Hydrocodone today without symptom relief. She denies visual changes, chest pain, nausea, vomiting, chills, fever, or numbness or weakness of the extremities. Patient has a history of cocaine addiction, alcohol abuse, depression, and hypertension.    Past Medical History  Diagnosis Date  . History of concussion   . Depression   . Addiction, marijuana   . Cocaine addiction   . Alcohol abuse   . Hypertension     does not take any meds  . Chlamydia    Past Surgical History  Procedure Laterality Date  . Wisdom tooth extraction  2007   Family History  Problem Relation Age of Onset  . Hypertension Other   . Diabetes Other    History  Substance Use Topics  . Smoking status: Current Every Day Smoker -- 1.00 packs/day for 9 years    Types: Cigarettes  . Smokeless tobacco: Never Used  . Alcohol Use: 7.7 oz/week    12 Cans of beer, 1 Drinks containing 0.5 oz of alcohol per week     Comment: daily   OB History   Grav Para Term Preterm Abortions TAB SAB Ect Mult Living   3 1   1  1   1      Review of Systems  Constitutional: Negative for fever and chills.  Eyes: Negative for visual  disturbance.  Cardiovascular: Negative for chest pain.  Gastrointestinal: Negative for nausea and vomiting.  Musculoskeletal: Positive for myalgias (left posterior shoulder) and neck pain.  Neurological: Negative for weakness and numbness.      Allergies  Review of patient's allergies indicates no known allergies.  Home Medications   Prior to Admission medications   Medication Sig Start Date End Date Taking? Authorizing Provider  ibuprofen (ADVIL,MOTRIN) 200 MG tablet Take 800 mg by mouth once.   Yes Historical Provider, MD  naproxen sodium (ALEVE) 220 MG tablet Take 220 mg by mouth once.   Yes Historical Provider, MD   Triage Vitals: BP 138/91  Pulse 90  Temp(Src) 97.7 F (36.5 C) (Oral)  Resp 22  SpO2 98% Physical Exam  Nursing note and vitals reviewed. Constitutional: She is oriented to person, place, and time. She appears well-developed and well-nourished. No distress.  HENT:  Head: Normocephalic and atraumatic.  Eyes: EOM are normal.  Neck: Neck supple. No tracheal deviation present.  Cardiovascular: Normal rate.   Pulmonary/Chest: Effort normal. No respiratory distress.  Musculoskeletal:       Left shoulder: She exhibits spasm.  Spasm in the left levator and trapezius muscle.  Neurological: She is alert and oriented to person, place, and time.  Skin: Skin is warm and dry.  Psychiatric: She  has a normal mood and affect. Her behavior is normal.    ED Course  NERVE BLOCK Date/Time: 10/25/2013 6:13 PM Performed by: Arthor Captain Authorized by: Arthor Captain Consent: Verbal consent obtained. written consent obtained. Patient identity confirmed: verbally with patient Indications: pain relief Body area: trunk Laterality: left Trapezius block. Patient sedated: no Preparation: Patient was prepped and draped in the usual sterile fashion. Patient position: sitting Needle gauge: 27 G Location technique: anatomical landmarks Local anesthetic: lidocaine 2% without  epinephrine Anesthetic total: 8 ml Outcome: pain improved Patient tolerance: Patient tolerated the procedure well with no immediate complications.   (including critical care time) DIAGNOSTIC STUDIES: Oxygen Saturation is 98% on room air, normal by my interpretation.    COORDINATION OF CARE: 4:49 PM- Patient informed of current plan for treatment and evaluation and agrees with plan at this time.   MDM   Final diagnoses:  Muscle spasm of left shoulder    Patient with right trapezius spasm.  TP injected with lidocaine with sig. Relief of sxs. Patient able tot turn her neck. Tramadol at d/c.  I personally performed the services described in this documentation, which was scribed in my presence. The recorded information has been reviewed and is accurate.      Arthor Captain, PA-C 10/26/13 2029

## 2013-10-25 NOTE — ED Notes (Signed)
Pt arrived by [facility]. Reports left back/scapula area began hurting yesterday morning, she describes it as a "small pinch" behind her left shoulder blade and it has progressively gotten worse and pain when  Moving her left arm.

## 2013-10-29 NOTE — ED Provider Notes (Signed)
Medical screening examination/treatment/procedure(s) were performed by non-physician practitioner and as supervising physician I was immediately available for consultation/collaboration.   EKG Interpretation None        Lorna Strother, MD 10/29/13 1057 

## 2014-03-08 ENCOUNTER — Emergency Department: Payer: Self-pay | Admitting: Emergency Medicine

## 2014-04-05 ENCOUNTER — Encounter (HOSPITAL_COMMUNITY): Payer: Self-pay | Admitting: Emergency Medicine

## 2015-09-30 ENCOUNTER — Emergency Department (HOSPITAL_COMMUNITY)
Admission: EM | Admit: 2015-09-30 | Discharge: 2015-09-30 | Disposition: A | Discharge status: Home / Self Care | Payer: Self-pay | Attending: Emergency Medicine | Admitting: Emergency Medicine

## 2015-09-30 ENCOUNTER — Encounter (HOSPITAL_COMMUNITY): Payer: Self-pay | Admitting: *Deleted

## 2015-09-30 ENCOUNTER — Emergency Department (HOSPITAL_COMMUNITY): Payer: Self-pay

## 2015-09-30 DIAGNOSIS — F17210 Nicotine dependence, cigarettes, uncomplicated: Secondary | ICD-10-CM | POA: Insufficient documentation

## 2015-09-30 DIAGNOSIS — F329 Major depressive disorder, single episode, unspecified: Secondary | ICD-10-CM | POA: Insufficient documentation

## 2015-09-30 DIAGNOSIS — R05 Cough: Secondary | ICD-10-CM

## 2015-09-30 DIAGNOSIS — N390 Urinary tract infection, site not specified: Secondary | ICD-10-CM | POA: Insufficient documentation

## 2015-09-30 DIAGNOSIS — I10 Essential (primary) hypertension: Secondary | ICD-10-CM | POA: Insufficient documentation

## 2015-09-30 DIAGNOSIS — Z79891 Long term (current) use of opiate analgesic: Secondary | ICD-10-CM | POA: Insufficient documentation

## 2015-09-30 DIAGNOSIS — Z7982 Long term (current) use of aspirin: Secondary | ICD-10-CM | POA: Insufficient documentation

## 2015-09-30 DIAGNOSIS — R059 Cough, unspecified: Secondary | ICD-10-CM

## 2015-09-30 DIAGNOSIS — F1410 Cocaine abuse, uncomplicated: Secondary | ICD-10-CM | POA: Insufficient documentation

## 2015-09-30 LAB — CBC WITH DIFFERENTIAL/PLATELET
BASOS ABS: 0 10*3/uL (ref 0.0–0.1)
Basophils Relative: 0 %
Eosinophils Absolute: 0.1 10*3/uL (ref 0.0–0.7)
Eosinophils Relative: 0 %
HEMATOCRIT: 44.4 % (ref 36.0–46.0)
Hemoglobin: 15.2 g/dL — ABNORMAL HIGH (ref 12.0–15.0)
LYMPHS PCT: 10 %
Lymphs Abs: 1.8 10*3/uL (ref 0.7–4.0)
MCH: 31.1 pg (ref 26.0–34.0)
MCHC: 34.2 g/dL (ref 30.0–36.0)
MCV: 91 fL (ref 78.0–100.0)
Monocytes Absolute: 1.4 10*3/uL — ABNORMAL HIGH (ref 0.1–1.0)
Monocytes Relative: 8 %
NEUTROS ABS: 14.7 10*3/uL — AB (ref 1.7–7.7)
Neutrophils Relative %: 82 %
PLATELETS: 333 10*3/uL (ref 150–400)
RBC: 4.88 MIL/uL (ref 3.87–5.11)
RDW: 15.6 % — ABNORMAL HIGH (ref 11.5–15.5)
WBC: 17.9 10*3/uL — AB (ref 4.0–10.5)

## 2015-09-30 LAB — URINE MICROSCOPIC-ADD ON

## 2015-09-30 LAB — HEPATIC FUNCTION PANEL
ALBUMIN: 4.1 g/dL (ref 3.5–5.0)
ALT: 18 U/L (ref 14–54)
AST: 23 U/L (ref 15–41)
Alkaline Phosphatase: 66 U/L (ref 38–126)
BILIRUBIN DIRECT: 0.1 mg/dL (ref 0.1–0.5)
Indirect Bilirubin: 0.6 mg/dL (ref 0.3–0.9)
Total Bilirubin: 0.7 mg/dL (ref 0.3–1.2)
Total Protein: 7.9 g/dL (ref 6.5–8.1)

## 2015-09-30 LAB — BASIC METABOLIC PANEL
ANION GAP: 12 (ref 5–15)
BUN: 11 mg/dL (ref 6–20)
CO2: 22 mmol/L (ref 22–32)
Calcium: 9.4 mg/dL (ref 8.9–10.3)
Chloride: 105 mmol/L (ref 101–111)
Creatinine, Ser: 0.88 mg/dL (ref 0.44–1.00)
GFR calc Af Amer: >60 mL/min (ref >60)
GLUCOSE: 89 mg/dL (ref 65–99)
POTASSIUM: 3.8 mmol/L (ref 3.5–5.1)
Sodium: 139 mmol/L (ref 135–145)

## 2015-09-30 LAB — URINALYSIS, ROUTINE W REFLEX MICROSCOPIC
Bilirubin Urine: NEGATIVE
Glucose, UA: NEGATIVE mg/dL
Ketones, ur: 15 mg/dL — AB
NITRITE: POSITIVE — AB
PROTEIN: NEGATIVE mg/dL
SPECIFIC GRAVITY, URINE: 1.024 (ref 1.005–1.030)
pH: 5.5 (ref 5.0–8.0)

## 2015-09-30 LAB — I-STAT TROPONIN, ED: Troponin i, poc: 0.01 ng/mL (ref 0.00–0.08)

## 2015-09-30 LAB — RAPID URINE DRUG SCREEN, HOSP PERFORMED
AMPHETAMINES: NOT DETECTED
BENZODIAZEPINES: NOT DETECTED
Barbiturates: NOT DETECTED
COCAINE: POSITIVE — AB
OPIATES: NOT DETECTED
Tetrahydrocannabinol: POSITIVE — AB

## 2015-09-30 LAB — POC URINE PREG, ED: PREG TEST UR: NEGATIVE

## 2015-09-30 MED ORDER — SODIUM CHLORIDE 0.9 % IV BOLUS (SEPSIS)
1000.0000 mL | Freq: Once | INTRAVENOUS | Status: AC
  Administered 2015-09-30: 1000 mL via INTRAVENOUS

## 2015-09-30 MED ORDER — IPRATROPIUM-ALBUTEROL 0.5-2.5 (3) MG/3ML IN SOLN
3.0000 mL | Freq: Once | RESPIRATORY_TRACT | Status: AC
  Administered 2015-09-30: 3 mL via RESPIRATORY_TRACT
  Filled 2015-09-30: qty 3

## 2015-09-30 MED ORDER — ACETAMINOPHEN 325 MG PO TABS
650.0000 mg | ORAL_TABLET | Freq: Once | ORAL | Status: AC
  Administered 2015-09-30: 650 mg via ORAL
  Filled 2015-09-30: qty 2

## 2015-09-30 MED ORDER — CEPHALEXIN 500 MG PO CAPS: 500.0000 mg | ORAL_CAPSULE | Freq: Three times a day (TID) | ORAL | Status: DC

## 2015-09-30 MED ORDER — ALBUTEROL SULFATE HFA 108 (90 BASE) MCG/ACT IN AERS: 1.0000 | INHALATION_SPRAY | Freq: Four times a day (QID) | RESPIRATORY_TRACT | Status: DC | PRN

## 2015-09-30 NOTE — ED Provider Notes (Signed)
CSN: [id number]     Arrival date & time 09/30/15  1348 History   First MD Initiated Contact with Patient 09/30/15 [phone]     Chief Complaint  Patient presents with  . Cough   HPI Comments: 30 year old female who presents with a productive cough. She states it has been going on for the past month however she never had it evaluated because she has allergies and thought it would get better. Reports associated inspiratory chest pain and fluttering which has been occuring for several weeks, SOB, wheezing. Patient does admit to cocaine use. Also reporting some dysuria and frequency. She has not taken any OTC medicine. Denies fever, chills, URI symptoms, abdominal pain, nausea, vomiting, diarrhea. Denies hx of lung disease or asthma. Reports sick contact who has had pneumonia.   Patient is a 30 y.o. female presenting with cough.  Cough Associated symptoms: chest pain, shortness of breath and wheezing   Associated symptoms: no chills, no fever and no sore throat     Past Medical History  Diagnosis Date  . History of concussion   . Depression   . Addiction, marijuana (HCC)   . Cocaine addiction (HCC)   . Alcohol abuse   . Hypertension     does not take any meds  . Chlamydia    Past Surgical History  Procedure Laterality Date  . Wisdom tooth extraction  2007   Family History  Problem Relation Age of Onset  . Hypertension Other   . Diabetes Other    Social History  Substance Use Topics  . Smoking status: Current Every Day Smoker -- 1.00 packs/day for 9 years    Types: Cigarettes  . Smokeless tobacco: Never Used  . Alcohol Use: 7.7 oz/week    12 Cans of beer, 1 Standard drinks or equivalent per week     Comment: daily   OB History    Gravida Para Term Preterm AB TAB SAB Ectopic Multiple Living   Review of Systems  Constitutional: Negative for fever and chills.  HENT: Negative for congestion, postnasal drip and sore throat.   Respiratory: Positive for cough,  shortness of breath and wheezing.   Cardiovascular: Positive for chest pain and palpitations. Negative for leg swelling.  Gastrointestinal: Negative for nausea, vomiting, abdominal pain, diarrhea and constipation.  Genitourinary: Negative for dysuria.  All other systems reviewed and are negative.     Allergies  Review of patient's allergies indicates no known allergies.  Home Medications   Prior to Admission medications   Medication Sig Start Date End Date Taking? Authorizing Provider  aspirin-sod bicarb-citric acid (ALKA-SELTZER) 325 MG TBEF tablet Take 325 mg by mouth every 6 (six) hours as needed (cold symptoms).   Yes Historical Provider, MD  traMADol (ULTRAM) 50 MG tablet Take 1 tablet (50 mg total) by mouth every 6 (six) hours as needed. Patient not taking: Reported on 09/30/2015 10/25/13   Arthor Captain, PA-C   BP 113/77 mmHg  Pulse 92  Temp(Src) 100.1 F (37.8 C) (Oral)  Resp 14  Ht  (1.676 m)  Wt 58.06 kg  BMI 20.67 kg/m2  SpO2 100%   Physical Exam  Constitutional: She is oriented to person, place, and time. She appears well-developed and well-nourished. No distress.  Akathisic  HENT:  Head: Normocephalic and atraumatic.  Eyes: Conjunctivae are normal. Pupils are equal, round, and reactive to light. Right eye exhibits no discharge. Left eye  exhibits no discharge. No scleral icterus.  Neck: Normal range of motion.  Cardiovascular: Regular rhythm.  Tachycardia present.  Exam reveals no gallop and no friction rub.   No murmur heard. Pulmonary/Chest: Effort normal. No respiratory distress. She has no wheezes. She has rhonchi in the right lower field, the left upper field, the left middle field and the left lower field. She has no rales. She exhibits no tenderness.  Abdominal: Soft. There is no tenderness.  Neurological: She is alert and oriented to person, place, and time.  Skin: Skin is warm and dry.  Psychiatric: She has a normal mood and affect.   ED Course   Procedures (including critical care time) Labs Review Labs Reviewed  CBC WITH DIFFERENTIAL/PLATELET - Abnormal; Notable for the following:    WBC 17.9 (*)    Hemoglobin 15.2 (*)    RDW 15.6 (*)    Neutro Abs 14.7 (*)    Monocytes Absolute 1.4 (*)    All other components within normal limits  URINALYSIS, ROUTINE W REFLEX MICROSCOPIC (NOT AT [facility]) - Abnormal; Notable for the following:    Color, Urine AMBER (*)    APPearance CLOUDY (*)    Hgb urine dipstick TRACE (*)    Ketones, ur 15 (*)    Nitrite POSITIVE (*)    Leukocytes, UA SMALL (*)    All other components within normal limits  URINE RAPID DRUG SCREEN, HOSP PERFORMED - Abnormal; Notable for the following:    Cocaine POSITIVE (*)    Tetrahydrocannabinol POSITIVE (*)    All other components within normal limits  URINE MICROSCOPIC-ADD ON - Abnormal; Notable for the following:    Squamous Epithelial / LPF 0-5 (*)    Bacteria, UA MANY (*)    All other components within normal limits  BASIC METABOLIC PANEL  HEPATIC FUNCTION PANEL  I-STAT TROPOININ, ED  POC URINE PREG, ED    Imaging Review Dg Chest 2 View  09/30/2015  CLINICAL DATA:  Productive cough with green sputum, shortness of breath, central chest pain for 2 weeks. Smoker. EXAM: CHEST  2 VIEW COMPARISON:  Chest x-ray dated 05/14/2010. FINDINGS: The heart size and mediastinal contours are within normal limits. Both lungs are clear. The visualized skeletal structures are unremarkable. IMPRESSION: No active cardiopulmonary disease. No evidence of pneumonia or bronchitis. Electronically Signed   By: Bary Richard M.D.   On: 09/30/2015 16:27   I have personally reviewed and evaluated these images and lab results as part of my medical decision-making.   EKG Interpretation   Date/Time:  Friday September 30 2015 14:02:29 EDT Ventricular Rate:  98 PR Interval:  123 QRS Duration: 70 QT Interval:  322 QTC Calculation: 411 R Axis:   79 Text Interpretation:  Sinus rhythm Biatrial  enlargement Confirmed by Lincoln Brigham [phone]) on 09/30/2015 4:03:34 PM      MDM   Final diagnoses:  Cocaine abuse  UTI (lower urinary tract infection)  Cough   30 year old female who presents with cough, chest pain, and dysuria   CXR is clear. Duoneb given. Lung sounds improved on repeat exam. Patient is restless and asking to leave. UA positive for cocaine and marijuana. EKG and troponin are normal. UA positive for UTI. CBC shows leukocytosis. CMP unremarkable. Preg test neg.  Keflex given for UTI. Albuterol inhaler given. Patient counseled on cocaine cessation. [facility] follow-up given. Vital signs are stable she is in no acute distress and anxious to leave. Return precautions given.  Bethel BornKelly Marie David Towson, PA-C 09/30/15 [id number]  Tilden FossaElizabeth Rees, MD 10/01/15 [phone]

## 2015-09-30 NOTE — ED Notes (Signed)
Pt reports cough x 1 month and cp x 2 weeks.  Pt is a smoker.  States she is worried that she might have PNA.  Pt also reports last night her heart was "flickering" but denies any n/v or dizziness at this time.

## 2015-09-30 NOTE — ED Notes (Addendum)
"  Last night I was drinking, but I wasn't drinking that much" "mostly chest pain while lying down drinking" no improvement when sitting up. "abdomal pain this morning"  Vomiting yesterday and a year ago."used "weed day before yesterday" denies any other street drugs. Ate a little today, able to keep it down. "I calls ambulance when my heart started flickering"

## 2015-09-30 NOTE — ED Notes (Signed)
EMS reports pt called due to "Chest Pains" has been coughing for 3 weeks. Pt ambulatory on arrival

## 2015-09-30 NOTE — ED Notes (Signed)
Pt ambulated to the BR with steady gait.   

## 2015-09-30 NOTE — ED Notes (Signed)
Patient transported to X-ray 

## 2015-09-30 NOTE — ED Notes (Signed)
RT contacted for neb tx.

## 2015-09-30 NOTE — ED Notes (Signed)
Pt states she needs to leave, made her aware that she is not up for discharge yet, EDP ordered for more blood work to check for blood clots in her lungs.  But she is also made aware that she can always leave and that she cannot be forced to stay but she would have to leave AMA.  Pt agreed to stay at this time.

## 2016-12-25 ENCOUNTER — Inpatient Hospital Stay (HOSPITAL_COMMUNITY)
Admission: AD | Admit: 2016-12-25 | Discharge: 2016-12-25 | Disposition: A | Discharge status: Home / Self Care | Payer: Medicaid Other | Source: Ambulatory Visit | Attending: Family Medicine | Admitting: Family Medicine

## 2016-12-25 DIAGNOSIS — Z7982 Long term (current) use of aspirin: Secondary | ICD-10-CM | POA: Insufficient documentation

## 2016-12-25 DIAGNOSIS — B373 Candidiasis of vulva and vagina: Secondary | ICD-10-CM

## 2016-12-25 DIAGNOSIS — B3731 Acute candidiasis of vulva and vagina: Secondary | ICD-10-CM

## 2016-12-25 DIAGNOSIS — N3000 Acute cystitis without hematuria: Secondary | ICD-10-CM

## 2016-12-25 DIAGNOSIS — F17210 Nicotine dependence, cigarettes, uncomplicated: Secondary | ICD-10-CM | POA: Insufficient documentation

## 2016-12-25 LAB — URINALYSIS, ROUTINE W REFLEX MICROSCOPIC
Bilirubin Urine: NEGATIVE
GLUCOSE, UA: NEGATIVE mg/dL
HGB URINE DIPSTICK: NEGATIVE
KETONES UR: NEGATIVE mg/dL
Leukocytes, UA: NEGATIVE
NITRITE: POSITIVE — AB
Protein, ur: NEGATIVE mg/dL
Specific Gravity, Urine: 1.024 (ref 1.005–1.030)
pH: 6 (ref 5.0–8.0)

## 2016-12-25 LAB — WET PREP, GENITAL
SPERM: NONE SEEN
TRICH WET PREP: NONE SEEN

## 2016-12-25 LAB — POCT PREGNANCY, URINE: Preg Test, Ur: NEGATIVE

## 2016-12-25 MED ORDER — FLUCONAZOLE 150 MG PO TABS: 150.0000 mg | ORAL_TABLET | Freq: Once | ORAL | 0 refills | Status: AC

## 2016-12-25 MED ORDER — NITROFURANTOIN MONOHYD MACRO 100 MG PO CAPS: 100.0000 mg | ORAL_CAPSULE | Freq: Two times a day (BID) | ORAL | 0 refills | Status: DC

## 2016-12-25 NOTE — MAU Note (Signed)
Irritation when she pees, doesn't feel like she is completely emptying bladder, been going on for about a month. Having frequency and urgency. Having vag d/c- noted about 3-4 wks ago

## 2016-12-25 NOTE — Discharge Instructions (Signed)

## 2016-12-25 NOTE — MAU Provider Note (Signed)
History     CSN: [id number]  Arrival date and time: 12/25/16 1818   Chief Complaint  Patient presents with  . Dysuria  . Vaginal Discharge   HPI Tiffany [name] is a 31 y.o. G3P0011 non pregnant female who presents with irritation when she urinates. She states it has been going on for about a month and feels like she cannot completely empty her bladder. She has had a UTI in the past and feels like this is the same. She also reports a clear vaginal discharge for the last week and pain with urination. She wants to be tested for STDs. She denies any vaginal bleeding.  OB History    Gravida Para Term Preterm AB Living   3 1     1 1    SAB TAB Ectopic Multiple Live Births   1              Past Medical History:  Diagnosis Date  . Addiction, marijuana (HCC)   . Alcohol abuse   . Chlamydia   . Cocaine addiction (HCC)   . Depression   . History of concussion   . Hypertension    does not take any meds    Past Surgical History:  Procedure Laterality Date  . WISDOM TOOTH EXTRACTION  2007    Family History  Problem Relation Age of Onset  . Hypertension Other   . Diabetes Other     Social History  Substance Use Topics  . Smoking status: Current Every Day Smoker    Packs/day: 1.00    Years: 9.00    Types: Cigarettes  . Smokeless tobacco: Never Used  . Alcohol use 7.7 oz/week    12 Cans of beer, 1 Standard drinks or equivalent per week     Comment: daily    Allergies: No Known Allergies  Prescriptions Prior to Admission  Medication Sig Dispense Refill Last Dose  . albuterol (PROVENTIL HFA;VENTOLIN HFA) 108 (90 Base) MCG/ACT inhaler Inhale 1-2 puffs into the lungs every 6 (six) hours as needed for wheezing or shortness of breath. 1 Inhaler 0   . aspirin-sod bicarb-citric acid (ALKA-SELTZER) 325 MG TBEF tablet Take 325 mg by mouth every 6 (six) hours as needed (cold symptoms).   Past Week at Unknown time  . cephALEXin (KEFLEX) 500 MG capsule Take 1 capsule (500 mg total)  by mouth 3 (three) times daily. 30 capsule 0   . traMADol (ULTRAM) 50 MG tablet Take 1 tablet (50 mg total) by mouth every 6 (six) hours as needed. (Patient not taking: Reported on 09/30/2015) 15 tablet 0 Completed Course at Unknown time    Review of Systems  Constitutional: Negative.  Negative for chills and fever.  HENT: Negative.   Respiratory: Negative.  Negative for shortness of breath.   Cardiovascular: Negative.  Negative for chest pain.  Gastrointestinal: Positive for abdominal pain. Negative for constipation, diarrhea, nausea and vomiting.  Genitourinary: Positive for dyspareunia, dysuria, frequency, urgency and vaginal discharge. Negative for vaginal bleeding.  Neurological: Negative.  Negative for dizziness and headaches.  Psychiatric/Behavioral: Negative.    Physical Exam   Blood pressure 109/72, pulse 64, resp. rate (!) 98, weight 125 lb (56.7 kg), last menstrual period 12/13/2016, SpO2 100 %, unknown if currently breastfeeding.  Physical Exam  Nursing note and vitals reviewed. Constitutional: She is oriented to person, place, and time. She appears well-developed and well-nourished.  HENT:  Head: Normocephalic and atraumatic.  Eyes: Conjunctivae are normal. No scleral icterus.  Cardiovascular: Normal rate,  regular rhythm and normal heart sounds.   Respiratory: Effort normal and breath sounds normal. No respiratory distress.  GI: Soft. She exhibits no distension. There is no tenderness. There is no guarding.  Neurological: She is alert and oriented to person, place, and time.  Skin: Skin is warm and dry.  Psychiatric: She has a normal mood and affect. Her behavior is normal. Judgment and thought content normal.    MAU Course  Procedures Results for orders placed or performed during the hospital encounter of 12/25/16 (from the past 24 hour(s))  Urinalysis, Routine w reflex microscopic     Status: Abnormal   Collection Time: 12/25/16  6:34 PM  Result Value Ref Range    Color, Urine YELLOW YELLOW   APPearance HAZY (A) CLEAR   Specific Gravity, Urine 1.024 1.005 - 1.030   pH 6.0 5.0 - 8.0   Glucose, UA NEGATIVE NEGATIVE mg/dL   Hgb urine dipstick NEGATIVE NEGATIVE   Bilirubin Urine NEGATIVE NEGATIVE   Ketones, ur NEGATIVE NEGATIVE mg/dL   Protein, ur NEGATIVE NEGATIVE mg/dL   Nitrite POSITIVE (A) NEGATIVE   Leukocytes, UA NEGATIVE NEGATIVE   RBC / HPF 0-5 0 - 5 RBC/hpf   WBC, UA 0-5 0 - 5 WBC/hpf   Bacteria, UA FEW (A) NONE SEEN   Squamous Epithelial / LPF 0-5 (A) NONE SEEN   Mucous PRESENT   Pregnancy, urine POC     Status: None   Collection Time: 12/25/16  6:40 PM  Result Value Ref Range   Preg Test, Ur NEGATIVE NEGATIVE  Wet prep, genital     Status: Abnormal   Collection Time: 12/25/16  8:25 PM  Result Value Ref Range   Yeast Wet Prep HPF POC PRESENT (A) NONE SEEN   Trich, Wet Prep NONE SEEN NONE SEEN   Clue Cells Wet Prep HPF POC PRESENT (A) NONE SEEN   WBC, Wet Prep HPF POC FEW (A) NONE SEEN   Sperm NONE SEEN     MDM UA, UPT Wet prep and gc/chlamydia  Assessment and Plan   1. Acute cystitis without hematuria   2. Candidiasis of vulva and vagina    -Discharge patient home in stable condition -Prescription for macrobid and diflucan given to patient. -Encouraged patient to increase fluid intake -Encouraged to return here or to other Urgent Care/ED if she develops worsening of symptoms, increase in pain, fever, or other concerning symptoms.   Cleone SlimCaroline Neill SNM 12/25/2016, 9:00 PM   I confirm that I have verified the information documented in the nurse midwife student's note and that I have also personally reperformed the physical exam and all medical decision making activities.   Thressa ShellerHeather Alvena Kiernan 10:18 PM 12/25/16

## 2016-12-26 LAB — GC/CHLAMYDIA PROBE AMP (~~LOC~~) NOT AT ARMC
CHLAMYDIA, DNA PROBE: NEGATIVE
NEISSERIA GONORRHEA: NEGATIVE

## 2017-07-04 ENCOUNTER — Emergency Department (HOSPITAL_COMMUNITY): Payer: Self-pay

## 2017-07-04 ENCOUNTER — Emergency Department (HOSPITAL_COMMUNITY)
Admission: EM | Admit: 2017-07-04 | Discharge: 2017-07-04 | Disposition: A | Discharge status: Home / Self Care | Payer: Self-pay | Attending: Emergency Medicine | Admitting: Emergency Medicine

## 2017-07-04 ENCOUNTER — Other Ambulatory Visit: Payer: Self-pay

## 2017-07-04 ENCOUNTER — Encounter (HOSPITAL_COMMUNITY): Payer: Self-pay | Admitting: Emergency Medicine

## 2017-07-04 DIAGNOSIS — F329 Major depressive disorder, single episode, unspecified: Secondary | ICD-10-CM | POA: Insufficient documentation

## 2017-07-04 DIAGNOSIS — N739 Female pelvic inflammatory disease, unspecified: Secondary | ICD-10-CM

## 2017-07-04 DIAGNOSIS — R1021 Pelvic and perineal pain right side: Secondary | ICD-10-CM

## 2017-07-04 DIAGNOSIS — I10 Essential (primary) hypertension: Secondary | ICD-10-CM | POA: Insufficient documentation

## 2017-07-04 DIAGNOSIS — R102 Pelvic and perineal pain: Secondary | ICD-10-CM

## 2017-07-04 DIAGNOSIS — F17210 Nicotine dependence, cigarettes, uncomplicated: Secondary | ICD-10-CM | POA: Insufficient documentation

## 2017-07-04 LAB — BASIC METABOLIC PANEL
Anion gap: 7 (ref 5–15)
BUN: 17 mg/dL (ref 6–20)
CHLORIDE: 107 mmol/L (ref 101–111)
CO2: 22 mmol/L (ref 22–32)
Calcium: 8.5 mg/dL — ABNORMAL LOW (ref 8.9–10.3)
Creatinine, Ser: 0.79 mg/dL (ref 0.44–1.00)
GFR calc Af Amer: >60 mL/min (ref >60)
GFR calc non Af Amer: >60 mL/min (ref >60)
Glucose, Bld: 98 mg/dL (ref 65–99)
Potassium: 3.7 mmol/L (ref 3.5–5.1)
SODIUM: 136 mmol/L (ref 135–145)

## 2017-07-04 LAB — WET PREP, GENITAL
Clue Cells Wet Prep HPF POC: NONE SEEN
Sperm: NONE SEEN
TRICH WET PREP: NONE SEEN
YEAST WET PREP: NONE SEEN

## 2017-07-04 LAB — URINALYSIS, ROUTINE W REFLEX MICROSCOPIC
BILIRUBIN URINE: NEGATIVE
GLUCOSE, UA: NEGATIVE mg/dL
HGB URINE DIPSTICK: NEGATIVE
Ketones, ur: 5 mg/dL — AB
Leukocytes, UA: NEGATIVE
NITRITE: NEGATIVE
PH: 5 (ref 5.0–8.0)
Protein, ur: NEGATIVE mg/dL
SPECIFIC GRAVITY, URINE: 1.018 (ref 1.005–1.030)

## 2017-07-04 LAB — CBC WITH DIFFERENTIAL/PLATELET
Basophils Absolute: 0 10*3/uL (ref 0.0–0.1)
Basophils Relative: 0 %
EOS ABS: 0 10*3/uL (ref 0.0–0.7)
Eosinophils Relative: 0 %
HCT: 38.5 % (ref 36.0–46.0)
HEMOGLOBIN: 13 g/dL (ref 12.0–15.0)
LYMPHS ABS: 1.2 10*3/uL (ref 0.7–4.0)
Lymphocytes Relative: 9 %
MCH: 31.3 pg (ref 26.0–34.0)
MCHC: 33.8 g/dL (ref 30.0–36.0)
MCV: 92.8 fL (ref 78.0–100.0)
MONO ABS: 0.8 10*3/uL (ref 0.1–1.0)
MONOS PCT: 6 %
NEUTROS PCT: 85 %
Neutro Abs: 11.3 10*3/uL — ABNORMAL HIGH (ref 1.7–7.7)
Platelets: 296 10*3/uL (ref 150–400)
RBC: 4.15 MIL/uL (ref 3.87–5.11)
RDW: 15.3 % (ref 11.5–15.5)
WBC: 13.3 10*3/uL — ABNORMAL HIGH (ref 4.0–10.5)

## 2017-07-04 LAB — PREGNANCY, URINE: Preg Test, Ur: NEGATIVE

## 2017-07-04 MED ORDER — MORPHINE SULFATE (PF) 4 MG/ML IV SOLN
4.0000 mg | Freq: Once | INTRAVENOUS | Status: AC
  Administered 2017-07-04: 4 mg via INTRAVENOUS
  Filled 2017-07-04: qty 1

## 2017-07-04 MED ORDER — IOPAMIDOL (ISOVUE-300) INJECTION 61%
100.0000 mL | Freq: Once | INTRAVENOUS | Status: AC | PRN
  Administered 2017-07-04: 100 mL via INTRAVENOUS

## 2017-07-04 MED ORDER — IOPAMIDOL (ISOVUE-300) INJECTION 61%
INTRAVENOUS | Status: AC
  Filled 2017-07-04: qty 100

## 2017-07-04 MED ORDER — DOXYCYCLINE HYCLATE 100 MG PO CAPS: 100.0000 mg | ORAL_CAPSULE | Freq: Two times a day (BID) | ORAL | 0 refills | Status: DC

## 2017-07-04 MED ORDER — CEFTRIAXONE SODIUM 250 MG IJ SOLR
250.0000 mg | Freq: Once | INTRAMUSCULAR | Status: AC
  Administered 2017-07-04: 250 mg via INTRAMUSCULAR
  Filled 2017-07-04: qty 250

## 2017-07-04 MED ORDER — IBUPROFEN 200 MG PO TABS
600.0000 mg | ORAL_TABLET | Freq: Once | ORAL | Status: AC
  Administered 2017-07-04: 600 mg via ORAL
  Filled 2017-07-04: qty 3

## 2017-07-04 MED ORDER — LIDOCAINE HCL 1 % IJ SOLN
INTRAMUSCULAR | Status: AC
  Filled 2017-07-04: qty 20

## 2017-07-04 MED ORDER — IBUPROFEN 600 MG PO TABS: 600.0000 mg | ORAL_TABLET | Freq: Three times a day (TID) | ORAL | 0 refills | Status: DC | PRN

## 2017-07-04 MED ORDER — DOXYCYCLINE HYCLATE 100 MG PO TABS
100.0000 mg | ORAL_TABLET | Freq: Once | ORAL | Status: AC
  Administered 2017-07-04: 100 mg via ORAL
  Filled 2017-07-04: qty 1

## 2017-07-04 NOTE — ED Provider Notes (Signed)
[name]-EMERGENCY DEPT Provider Note   CSN: [id number] Arrival date & time: 07/04/17  [id number]     History   Chief Complaint Chief Complaint  Patient presents with  . Abdominal Pain    HPI Tiffany [name] is a 32 y.o. female.  Range of right-sided suprapubic pain onset 2 days ago.  Pain is constant feels like ovarian cyst or ectopic pregnancy she has had in the past.  She is never had surgery for ectopic pregnancy.  She denies anorexia denies fever she ate pork chops yesterday.  Her last normal menstrual period was 6 days ago.  Last bowel movement yesterday, normal.  Pain is worse with movement and improved with remaining still.  No other associated symptoms.  No urinary symptoms no vaginal discharge.  No treatment prior to coming here  HPI  Past Medical History:  Diagnosis Date  . Addiction, marijuana (HCC)   . Alcohol abuse   . Chlamydia   . Cocaine addiction (HCC)   . Depression   . History of concussion   . Hypertension    does not take any meds    Patient Active Problem List   Diagnosis Date Noted  . Incomplete miscarriage 05/13/2012  . Polysubstance abuse (HCC) 10/21/2011  . Depression 10/21/2011    Past Surgical History:  Procedure Laterality Date  . WISDOM TOOTH EXTRACTION  2007    OB History    Gravida Para Term Preterm AB Living   3 1     1 1    SAB TAB Ectopic Multiple Live Births   1               Home Medications    Prior to Admission medications   Not on File    Family History Family History  Problem Relation Age of Onset  . Hypertension Other   . Diabetes Other     Social History Social History   Tobacco Use  . Smoking status: Current Every Day Smoker    Packs/day: 1.00    Years: 9.00    Pack years: 9.00    Types: Cigarettes  . Smokeless tobacco: Never Used  Substance Use Topics  . Alcohol use: Yes    Alcohol/week: 7.7 oz    Types: 12 Cans of beer, 1 Standard drinks or equivalent per week    Comment:  daily  . Drug use: Yes    Frequency: 1.0 times per week    Types: Marijuana, Cocaine    Comment: hydrocodone from family member, crack and powder cocaine     Allergies   Patient has no known allergies.   Review of Systems Review of Systems  Constitutional: Negative.   HENT: Negative.   Respiratory: Negative.   Cardiovascular: Negative.   Gastrointestinal: Positive for abdominal pain.  Musculoskeletal: Negative.   Skin: Negative.   Neurological: Negative.   Psychiatric/Behavioral: Negative.   All other systems reviewed and are negative.    Physical Exam Updated Vital Signs BP 107/63 (BP Location: Right Arm)   Pulse (!) 103   Temp 98.9 F (37.2 C) (Oral)   Resp 16   SpO2 99%   Physical Exam  Constitutional: She appears well-developed and well-nourished.  HENT:  Head: Normocephalic and atraumatic.  Eyes: Conjunctivae are normal. Pupils are equal, round, and reactive to light.  Neck: Neck supple. No tracheal deviation present. No thyromegaly present.  Cardiovascular: Normal rate and regular rhythm.  No murmur heard. Pulmonary/Chest: Effort normal and breath sounds normal.  Abdominal: Soft.  Bowel sounds are normal. She exhibits no distension. There is tenderness. There is guarding.  Tender at right suprapubic area with voluntary guarding  Genitourinary:  Genitourinary Comments:  pelvicExam no external lesion cervical loss closed.  Thick foul-smelling yellowish discharge.  Positive cervical motion tenderness positive right adnexal tenderness minimal left adnexal tenderness  Musculoskeletal: Normal range of motion. She exhibits no edema or tenderness.  Neurological: She is alert. Coordination normal.  Skin: Skin is warm and dry. No rash noted.  Psychiatric: She has a normal mood and affect.  Nursing note and vitals reviewed.    ED Treatments / Results  Labs (all labs ordered are listed, but only abnormal results are displayed) Labs Reviewed  WET PREP, GENITAL    RPR  HIV ANTIBODY (ROUTINE TESTING)  URINALYSIS, ROUTINE W REFLEX MICROSCOPIC  CBC WITH DIFFERENTIAL/PLATELET  BASIC METABOLIC PANEL  GC/CHLAMYDIA PROBE AMP ([place]) NOT AT [facility]    EKG  EKG Interpretation None      Results for orders placed or performed during the hospital encounter of 07/04/17  Wet prep, genital  Result Value Ref Range   Yeast Wet Prep HPF POC NONE SEEN NONE SEEN   Trich, Wet Prep NONE SEEN NONE SEEN   Clue Cells Wet Prep HPF POC NONE SEEN NONE SEEN   WBC, Wet Prep HPF POC FEW (A) NONE SEEN   Sperm NONE SEEN   Urinalysis, Routine w reflex microscopic  Result Value Ref Range   Color, Urine YELLOW YELLOW   APPearance CLEAR CLEAR   Specific Gravity, Urine 1.018 1.005 - 1.030   pH 5.0 5.0 - 8.0   Glucose, UA NEGATIVE NEGATIVE mg/dL   Hgb urine dipstick NEGATIVE NEGATIVE   Bilirubin Urine NEGATIVE NEGATIVE   Ketones, ur 5 (A) NEGATIVE mg/dL   Protein, ur NEGATIVE NEGATIVE mg/dL   Nitrite NEGATIVE NEGATIVE   Leukocytes, UA NEGATIVE NEGATIVE  CBC with Differential/Platelet  Result Value Ref Range   WBC 13.3 (H) 4.0 - 10.5 K/uL   RBC 4.15 3.87 - 5.11 MIL/uL   Hemoglobin 13.0 12.0 - 15.0 g/dL   HCT 96.0 45.4 - 09.8 %   MCV 92.8 78.0 - 100.0 fL   MCH 31.3 26.0 - 34.0 pg   MCHC 33.8 30.0 - 36.0 g/dL   RDW 11.9 14.7 - 82.9 %   Platelets 296 150 - 400 K/uL   Neutrophils Relative % 85 %   Neutro Abs 11.3 (H) 1.7 - 7.7 K/uL   Lymphocytes Relative 9 %   Lymphs Abs 1.2 0.7 - 4.0 K/uL   Monocytes Relative 6 %   Monocytes Absolute 0.8 0.1 - 1.0 K/uL   Eosinophils Relative 0 %   Eosinophils Absolute 0.0 0.0 - 0.7 K/uL   Basophils Relative 0 %   Basophils Absolute 0.0 0.0 - 0.1 K/uL  Basic metabolic panel  Result Value Ref Range   Sodium 136 135 - 145 mmol/L   Potassium 3.7 3.5 - 5.1 mmol/L   Chloride 107 101 - 111 mmol/L   CO2 22 22 - 32 mmol/L   Glucose, Bld 98 65 - 99 mg/dL   BUN 17 6 - 20 mg/dL   Creatinine, Ser 5.62 0.44 - 1.00 mg/dL   Calcium  8.5 (L) 8.9 - 10.3 mg/dL   GFR calc non Af Amer >60 >60 mL/min   GFR calc Af Amer >60 >60 mL/min   Anion gap 7 5 - 15  Pregnancy, urine  Result Value Ref Range   Preg Test, Ur NEGATIVE  NEGATIVE   US Pelvis Transvanginal Non-ob (tv Only)  Result Date: 07/04/2017 CLINICAL DATA:  Right pelvic pain for 2 days. Right adnexal tenderness on exam. Clinical suspicion for ovarian torsion. EXAM: TRANSABDOMINAL AND TRANSVAGINAL ULTRASOUND OF PELVIS DOPPLER ULTRASOUND OF OVARIES TECHNIQUE: Both transabdominal and transvaginal ultrasound examinations of the pelvis were performed. Transabdominal technique was performed for global imaging of the pelvis including uterus, ovaries, adnexal regions, and pelvic cul-de-sac. It was necessary to proceed with endovaginal exam following the transabdominal exam to visualize the endometrium and ovaries. Color and duplex Doppler ultrasound was utilized to evaluate blood flow to the ovaries. COMPARISON:  None. FINDINGS: Uterus Measurements: 7.5 x 3.2 x 5.0 cm. No fibroids or other mass visualized. Endometrium Thickness: 6 mm.  No focal abnormality visualized. Right ovary Measurements: 4.2 x 2.9 x 2.9 cm. Normal appearance/no adnexal mass. Left ovary Measurements: 3.5 x 2.4 x 2.2 cm. Normal appearance/no adnexal mass. Pulsed Doppler evaluation of both ovaries demonstrates normal low-resistance arterial and venous waveforms. Other findings No abnormal free fluid. IMPRESSION: Normal appearance of uterus and ovaries. No pelvic mass or other significant abnormality identified. No sonographic evidence for ovarian torsion. Electronically Signed   By: Myles Rosenthal M.D.   On: 07/04/2017 13:18   US Pelvis Complete  Result Date: 07/04/2017 CLINICAL DATA:  Right pelvic pain for 2 days. Right adnexal tenderness on exam. Clinical suspicion for ovarian torsion. EXAM: TRANSABDOMINAL AND TRANSVAGINAL ULTRASOUND OF PELVIS DOPPLER ULTRASOUND OF OVARIES TECHNIQUE: Both transabdominal and transvaginal  ultrasound examinations of the pelvis were performed. Transabdominal technique was performed for global imaging of the pelvis including uterus, ovaries, adnexal regions, and pelvic cul-de-sac. It was necessary to proceed with endovaginal exam following the transabdominal exam to visualize the endometrium and ovaries. Color and duplex Doppler ultrasound was utilized to evaluate blood flow to the ovaries. COMPARISON:  None. FINDINGS: Uterus Measurements: 7.5 x 3.2 x 5.0 cm. No fibroids or other mass visualized. Endometrium Thickness: 6 mm.  No focal abnormality visualized. Right ovary Measurements: 4.2 x 2.9 x 2.9 cm. Normal appearance/no adnexal mass. Left ovary Measurements: 3.5 x 2.4 x 2.2 cm. Normal appearance/no adnexal mass. Pulsed Doppler evaluation of both ovaries demonstrates normal low-resistance arterial and venous waveforms. Other findings No abnormal free fluid. IMPRESSION: Normal appearance of uterus and ovaries. No pelvic mass or other significant abnormality identified. No sonographic evidence for ovarian torsion. Electronically Signed   By: Myles Rosenthal M.D.   On: 07/04/2017 13:18   Ct Abdomen Pelvis W Contrast  Result Date: 07/04/2017 CLINICAL DATA:  Acute right lower quadrant abdominal pain. EXAM: CT ABDOMEN AND PELVIS WITH CONTRAST TECHNIQUE: Multidetector CT imaging of the abdomen and pelvis was performed using the standard protocol following bolus administration of intravenous contrast. CONTRAST:  100 mL ISOVUE-300 IOPAMIDOL (ISOVUE-300) INJECTION 61% COMPARISON:  None. FINDINGS: Lower chest: No acute abnormality. Hepatobiliary: No focal liver abnormality is seen. No gallstones, gallbladder wall thickening, or biliary dilatation. Pancreas: Unremarkable. No pancreatic ductal dilatation or surrounding inflammatory changes. Spleen: Normal in size without focal abnormality. Adrenals/Urinary Tract: Adrenal glands are unremarkable. Kidneys are normal, without renal calculi, focal lesion, or  hydronephrosis. Bladder is unremarkable. Stomach/Bowel: Stomach is within normal limits. Appendix appears normal. No evidence of bowel wall thickening, distention, or inflammatory changes. Vascular/Lymphatic: No significant vascular findings are present. No enlarged abdominal or pelvic lymph nodes. Reproductive: Uterus and bilateral adnexa are unremarkable. Other: No abdominal wall hernia or abnormality. No abdominopelvic ascites. Musculoskeletal: No acute or significant osseous findings. IMPRESSION: No abnormality seen  in the abdomen or pelvis. Electronically Signed   By: Lupita Raider, M.D.   On: 07/04/2017 15:38   US Pelvic Doppler (torsion R/o Or Mass Arterial Flow)  Result Date: 07/04/2017 CLINICAL DATA:  Right pelvic pain for 2 days. Right adnexal tenderness on exam. Clinical suspicion for ovarian torsion. EXAM: TRANSABDOMINAL AND TRANSVAGINAL ULTRASOUND OF PELVIS DOPPLER ULTRASOUND OF OVARIES TECHNIQUE: Both transabdominal and transvaginal ultrasound examinations of the pelvis were performed. Transabdominal technique was performed for global imaging of the pelvis including uterus, ovaries, adnexal regions, and pelvic cul-de-sac. It was necessary to proceed with endovaginal exam following the transabdominal exam to visualize the endometrium and ovaries. Color and duplex Doppler ultrasound was utilized to evaluate blood flow to the ovaries. COMPARISON:  None. FINDINGS: Uterus Measurements: 7.5 x 3.2 x 5.0 cm. No fibroids or other mass visualized. Endometrium Thickness: 6 mm.  No focal abnormality visualized. Right ovary Measurements: 4.2 x 2.9 x 2.9 cm. Normal appearance/no adnexal mass. Left ovary Measurements: 3.5 x 2.4 x 2.2 cm. Normal appearance/no adnexal mass. Pulsed Doppler evaluation of both ovaries demonstrates normal low-resistance arterial and venous waveforms. Other findings No abnormal free fluid. IMPRESSION: Normal appearance of uterus and ovaries. No pelvic mass or other significant  abnormality identified. No sonographic evidence for ovarian torsion. Electronically Signed   By: Myles Rosenthal M.D.   On: 07/04/2017 13:18   Radiology No results found.  Procedures Procedures (including critical care time)  Medications Ordered in ED Medications  morphine 4 MG/ML injection 4 mg (not administered)     Initial Impression / Assessment and Plan / ED Course  I have reviewed the triage vital signs and the nursing notes.  Pertinent labs & imaging results that were available during my care of the patient were reviewed by me and considered in my medical decision making (see chart for details).     10:55 AM pain improved after treatment with intravenous morphine 10 PM requesting more pain medicine additional intravenous morphine ordered. 4 PM pain improved after treatment with intravenous opioids.  He is presently hungry.   Favor PID in light of CT scanthick yellow foul smelling vagibnal d/c.  Rocephin 250 IM prior to discharge Prescriptions ibuprofen, doxycycline.  Safe sex encouraged.  [facility] patient states she is not followed at the [facility] anymore Final Clinical Impressions(s) / ED Diagnoses  Diagnosis pelvic inflammatory disease Final diagnoses:  None    ED Discharge Orders    None       Doug Sou, MD 07/04/17 1700

## 2017-07-04 NOTE — ED Notes (Signed)
Called lab, urine preg. Should be done within the next 10 minutes.

## 2017-07-04 NOTE — ED Notes (Addendum)
This Clinical research associatewriter took a call for the patient's primary  Nurse from [facility] lab. Lab representative Cephus SlaterPam W., states the GC Chlamydia swab and surrounding tube container sent was incorrect and is being rejected by the lab. Lab representative states the chlamydia rectangular agar swab was appropriate and is being processed. Lab representative requests a new BLUE swab be sent in the associated WHITE topped tube to the lab. Patient's primary nurse notified. EDMD notified

## 2017-07-04 NOTE — ED Notes (Signed)
Pt requesting pain medicine before CT per CT tech

## 2017-07-04 NOTE — Discharge Instructions (Signed)
Use a condom each time that you have sex.  Tell all sexual partners that you have to be checked for sexually transmitted diseases at the health department or at the primary care physicians.  Call the [facility] tomorrow to get a gynecologist.  If you continue to have significant pain in 3 or 4 days go to the maternity admissions unit at [facility]

## 2017-07-04 NOTE — ED Triage Notes (Addendum)
Per EMS, pt has been experiencing sharp RLQ abdominal x 2 days. LBM 2 days ago and is on her last day of her menstrual period. Says this feels like her last ectopic pregnancy. Pain 10/10. Denies N/V/D

## 2017-07-04 NOTE — ED Notes (Signed)
Bed: WA07 Expected date:  Expected time:  Means of arrival:  Comments: EMS-inguinal pain

## 2017-07-04 NOTE — ED Notes (Signed)
US at bedside

## 2017-07-05 LAB — RPR: RPR Ser Ql: NONREACTIVE

## 2017-07-05 LAB — GC/CHLAMYDIA PROBE AMP (~~LOC~~) NOT AT ARMC
CHLAMYDIA, DNA PROBE: NEGATIVE
NEISSERIA GONORRHEA: POSITIVE — AB

## 2017-07-05 LAB — HIV ANTIBODY (ROUTINE TESTING W REFLEX): HIV Screen 4th Generation wRfx: NONREACTIVE

## 2017-07-06 LAB — URINE CULTURE

## 2017-07-07 ENCOUNTER — Telehealth: Payer: Self-pay

## 2017-07-07 NOTE — Progress Notes (Signed)
ED Antimicrobial Stewardship Positive Culture Follow Up   [name] is an 32 y.o. female who [facility] with a chief complaint of  Chief Complaint  Patient presents with  . Abdominal Pain    Recent Results (from the past 720 hour(s))  Wet prep, genital     Status: Abnormal   Collection Time: 07/04/17 10:59 AM  Result Value Ref Range Status   Yeast Wet Prep HPF POC NONE SEEN NONE SEEN Final   Trich, Wet Prep NONE SEEN NONE SEEN Final   Clue Cells Wet Prep HPF POC NONE SEEN NONE SEEN Final   WBC, Wet Prep HPF POC FEW (A) NONE SEEN Final   Sperm NONE SEEN  Final  Urine Culture     Status: Abnormal   Collection Time: 07/04/17 10:59 AM  Result Value Ref Range Status   Specimen Description   Final    URINE, RANDOM Performed at [facility], [address] [place], [place] [address]    Special Requests   Final    NONE Performed at [facility], [address] [place], [place] [address]    Culture 60,000 COLONIES/mL ESCHERICHIA COLI (A)  Final   Report Status 07/06/2017 FINAL  Final   Organism ID, Bacteria ESCHERICHIA COLI (A)  Final      Susceptibility   Escherichia coli - MIC*    AMPICILLIN >=32 RESISTANT Resistant     CEFAZOLIN <=4 SENSITIVE Sensitive     CEFTRIAXONE <=1 SENSITIVE Sensitive     CIPROFLOXACIN <=0.25 SENSITIVE Sensitive     GENTAMICIN <=1 SENSITIVE Sensitive     IMIPENEM <=0.25 SENSITIVE Sensitive     NITROFURANTOIN <=16 SENSITIVE Sensitive     TRIMETH/SULFA <=20 SENSITIVE Sensitive     AMPICILLIN/SULBACTAM >=32 RESISTANT Resistant     PIP/TAZO <=4 SENSITIVE Sensitive     Extended ESBL NEGATIVE Sensitive     * 60,000 COLONIES/mL ESCHERICHIA COLI      New antibiotic prescription:  Patient already appropriately treated for gonorrhea/PID.  Do not treat urine.  ED Provider: Darnelle CatalanEmily Shroesbee, PA-C   Della GooEmily S Pilot Prindle, PharmD 07/07/2017, 9:14 AM Infectious Diseases Pharmacy  Resident Phone# [phone]

## 2017-07-07 NOTE — Telephone Encounter (Signed)
No treatment needed for UC from ED 07/04/17 per Francena HanlyEmily  Shosbree PA

## 2019-03-11 ENCOUNTER — Encounter (HOSPITAL_COMMUNITY): Payer: Self-pay | Admitting: Family Medicine

## 2019-03-11 ENCOUNTER — Ambulatory Visit (HOSPITAL_COMMUNITY)
Admission: EM | Admit: 2019-03-11 | Discharge: 2019-03-11 | Disposition: A | Discharge status: Home / Self Care | Payer: Medicaid Other | Attending: Emergency Medicine | Admitting: Emergency Medicine

## 2019-03-11 ENCOUNTER — Other Ambulatory Visit: Payer: Self-pay

## 2019-03-11 DIAGNOSIS — Z3202 Encounter for pregnancy test, result negative: Secondary | ICD-10-CM

## 2019-03-11 DIAGNOSIS — Z113 Encounter for screening for infections with a predominantly sexual mode of transmission: Secondary | ICD-10-CM

## 2019-03-11 DIAGNOSIS — Z0189 Encounter for other specified special examinations: Secondary | ICD-10-CM | POA: Insufficient documentation

## 2019-03-11 DIAGNOSIS — R319 Hematuria, unspecified: Secondary | ICD-10-CM | POA: Insufficient documentation

## 2019-03-11 DIAGNOSIS — N390 Urinary tract infection, site not specified: Secondary | ICD-10-CM | POA: Insufficient documentation

## 2019-03-11 DIAGNOSIS — Z20828 Contact with and (suspected) exposure to other viral communicable diseases: Secondary | ICD-10-CM | POA: Insufficient documentation

## 2019-03-11 LAB — POCT URINALYSIS DIP (DEVICE)
Bilirubin Urine: NEGATIVE
Glucose, UA: NEGATIVE mg/dL
Ketones, ur: NEGATIVE mg/dL
Nitrite: POSITIVE — AB
Protein, ur: 100 mg/dL — AB
Specific Gravity, Urine: 1.02 (ref 1.005–1.030)
Urobilinogen, UA: 0.2 mg/dL (ref 0.0–1.0)
pH: 6 (ref 5.0–8.0)

## 2019-03-11 LAB — POCT PREGNANCY, URINE: Preg Test, Ur: NEGATIVE

## 2019-03-11 MED ORDER — CEPHALEXIN 500 MG PO CAPS: 500.0000 mg | ORAL_CAPSULE | Freq: Three times a day (TID) | ORAL | 0 refills | Status: AC

## 2019-03-11 NOTE — ED Provider Notes (Signed)
[facility]    CSN: [id number] Arrival date & time: 03/11/19  1455      History   Chief Complaint Chief Complaint  Patient presents with  . Urinary Tract Infection    Had for 1.5 weeks    HPI Tiffany [name] is a 33 y.o. female.   Patient presents with dysuria x1 week.  She also request STD testing and a COVID test.` She denies vaginal discharge, pelvic pain, fever, chills, sore throat, congestion, cough, shortness of breath, abdominal pain, vomiting, diarrhea, or other symptoms.  The history is provided by the patient.    Past Medical History:  Diagnosis Date  . Addiction, marijuana (HCC)   . Alcohol abuse   . Chlamydia   . Cocaine addiction (HCC)   . Depression   . History of concussion   . Hypertension    does not take any meds    Patient Active Problem List   Diagnosis Date Noted  . Incomplete miscarriage 05/13/2012  . Polysubstance abuse (HCC) 10/21/2011  . Depression 10/21/2011    Past Surgical History:  Procedure Laterality Date  . WISDOM TOOTH EXTRACTION  2007    OB History    Gravida  3   Para  1   Term      Preterm      AB  1   Living  1     SAB  1   TAB      Ectopic      Multiple      Live Births               Home Medications    Prior to Admission medications   Medication Sig Start Date End Date Taking? Authorizing Provider  cephALEXin (KEFLEX) 500 MG capsule Take 1 capsule (500 mg total) by mouth 3 (three) times daily for 7 days. 03/11/19 03/18/19  Mickie Bailate, Savier Trickett H, NP  doxycycline (VIBRAMYCIN) 100 MG capsule Take 1 capsule (100 mg total) by mouth 2 (two) times daily. One po bid x 14 days 07/04/17   Doug SouJacubowitz, Sam, MD  ibuprofen (ADVIL,MOTRIN) 600 MG tablet Take 1 tablet (600 mg total) by mouth every 8 (eight) hours as needed (Pain.  Take with food). 07/04/17   Doug SouJacubowitz, Sam, MD    Family History Family History  Problem Relation Age of Onset  . Hypertension Other   . Diabetes Other   . Heart failure  Mother   . Hypertension Mother     Social History Social History   Tobacco Use  . Smoking status: Current Every Day Smoker    Packs/day: 1.00    Years: 9.00    Pack years: 9.00    Types: Cigarettes  . Smokeless tobacco: Never Used  Substance Use Topics  . Alcohol use: Yes    Alcohol/week: 13.0 standard drinks    Types: 12 Cans of beer, 1 Standard drinks or equivalent per week    Comment: daily  . Drug use: Yes    Frequency: 1.0 times per week    Types: Marijuana, Cocaine    Comment: hydrocodone from family member, crack and powder cocaine     Allergies   Patient has no known allergies.   Review of Systems Review of Systems  Constitutional: Negative for chills and fever.  HENT: Negative for ear pain and sore throat.   Eyes: Negative for pain and visual disturbance.  Respiratory: Negative for cough and shortness of breath.   Cardiovascular: Negative for chest pain and palpitations.  Gastrointestinal: Negative for abdominal pain and vomiting.  Genitourinary: Positive for dysuria. Negative for flank pain, hematuria and vaginal discharge.  Musculoskeletal: Negative for arthralgias and back pain.  Skin: Negative for color change and rash.  Neurological: Negative for seizures and syncope.  All other systems reviewed and are negative.    Physical Exam Triage Vital Signs ED Triage Vitals  Enc Vitals Group     BP --      Pulse --      Resp --      Temp --      Temp src --      SpO2 --      Weight 03/11/19 1604 130 lb (59 kg)     Height --      Head Circumference --      Peak Flow --      Pain Score 03/11/19 1603 10     Pain Loc --      Pain Edu? --      Excl. in GC? --    No data found.  Updated Vital Signs BP 110/60 (BP Location: Left Arm)   Pulse 74   Wt 130 lb (59 kg)   SpO2 100%   BMI 20.98 kg/m   Visual Acuity Right Eye Distance:   Left Eye Distance:   Bilateral Distance:    Right Eye Near:   Left Eye Near:    Bilateral Near:     Physical  Exam Vitals signs and nursing note reviewed.  Constitutional:      Appearance: She is well-developed. She is not ill-appearing.  HENT:     Head: Normocephalic and atraumatic.     Mouth/Throat:     Mouth: Mucous membranes are moist.  Eyes:     Conjunctiva/sclera: Conjunctivae normal.  Neck:     Musculoskeletal: Neck supple.  Cardiovascular:     Rate and Rhythm: Normal rate and regular rhythm.     Heart sounds: No murmur.  Pulmonary:     Effort: Pulmonary effort is normal. No respiratory distress.     Breath sounds: Normal breath sounds.  Abdominal:     General: Bowel sounds are normal.     Palpations: Abdomen is soft.     Tenderness: There is no abdominal tenderness. There is no right CVA tenderness, left CVA tenderness, guarding or rebound.  Skin:    General: Skin is warm and dry.     Findings: No rash.  Neurological:     General: No focal deficit present.     Mental Status: She is alert and oriented to person, place, and time.      UC Treatments / Results  Labs (all labs ordered are listed, but only abnormal results are displayed) Labs Reviewed  POCT URINALYSIS DIP (DEVICE) - Abnormal; Notable for the following components:      Result Value   Hgb urine dipstick MODERATE (*)    Protein, ur 100 (*)    Nitrite POSITIVE (*)    Leukocytes,Ua MODERATE (*)    All other components within normal limits  NOVEL CORONAVIRUS, NAA (HOSP ORDER, SEND-OUT TO REF LAB; TAT 18-24 HRS)  URINE CULTURE  POC URINE PREG, ED  POCT PREGNANCY, URINE  CERVICOVAGINAL ANCILLARY ONLY    EKG   Radiology No results found.  Procedures Procedures (including critical care time)  Medications Ordered in UC Medications - No data to display  Initial Impression / Assessment and Plan / UC Course  I have reviewed the triage vital signs and the  nursing notes.  Pertinent labs & imaging results that were available during my care of the patient were reviewed by me and considered in my medical  decision making (see chart for details).    UTI.  Patient request for STD testing and a COVID test.  Urine pregnancy negative.  Urine dip shows moderate LE, positive nitrite, moderate blood.  Urine culture pending.  Treating with cephalexin.  No STD treatment given today as patient does not have vaginal symptoms.  Vaginal swab for STDs obtained.  COVID test performed here.  Instructed patient to self quarantine until her test results are back.  Instructed patient to go to the emergency department if she develops high fever, shortness of breath, severe diarrhea, or other concerning symptoms.  Patient agrees with plan of care.    Final Clinical Impressions(s) / UC Diagnoses   Final diagnoses:  Urinary tract infection with hematuria, site unspecified  Patient request for diagnostic testing     Discharge Instructions     Your urine shows signs of an infection.  A urine culture is pending.  Take the antibiotic as prescribed.    Your urine pregnancy test is negative.    Your STD tests are pending.  If your test results are positive, we will call you.  You may need treatment and your partner may also need treatment.    Do not have sex until your STD test results are back.    Your COVID test is pending.  You should self quarantine until your test result is back and is negative.    Go to the emergency department if you develop high fever, shortness of breath, severe diarrhea, or other concerning symptoms.        ED Prescriptions    Medication Sig Dispense Auth. Provider   cephALEXin (KEFLEX) 500 MG capsule Take 1 capsule (500 mg total) by mouth 3 (three) times daily for 7 days. 21 capsule Sharion Balloon, NP     PDMP not reviewed this encounter.   Sharion Balloon, NP 03/11/19 [phone]

## 2019-03-11 NOTE — ED Triage Notes (Signed)
Pt. States she may have a UTI for 1.5 weeks and wants STD testing.

## 2019-03-11 NOTE — Discharge Instructions (Addendum)
Your urine shows signs of an infection.  A urine culture is pending.  Take the antibiotic as prescribed.    Your urine pregnancy test is negative.    Your STD tests are pending.  If your test results are positive, we will call you.  You may need treatment and your partner may also need treatment.    Do not have sex until your STD test results are back.    Your COVID test is pending.  You should self quarantine until your test result is back and is negative.    Go to the emergency department if you develop high fever, shortness of breath, severe diarrhea, or other concerning symptoms.

## 2019-03-13 ENCOUNTER — Telehealth (HOSPITAL_COMMUNITY): Payer: Self-pay | Admitting: Emergency Medicine

## 2019-03-13 LAB — URINE CULTURE: Culture: >=100000 — AB

## 2019-03-13 LAB — NOVEL CORONAVIRUS, NAA (HOSP ORDER, SEND-OUT TO REF LAB; TAT 18-24 HRS): SARS-CoV-2, NAA: NOT DETECTED

## 2019-03-13 NOTE — Telephone Encounter (Signed)
Urine culture was positive for e coli and was given keflex at urgent care visit. Pt contacted and made aware, educated on completing antibiotic and to follow up if symptoms are persistent. Verbalized understanding.    

## 2019-03-16 ENCOUNTER — Telehealth (HOSPITAL_COMMUNITY): Payer: Self-pay | Admitting: Emergency Medicine

## 2019-03-16 LAB — CERVICOVAGINAL ANCILLARY ONLY
Bacterial vaginitis: POSITIVE — AB
Candida vaginitis: POSITIVE — AB
Chlamydia: NEGATIVE
Neisseria Gonorrhea: NEGATIVE
Trichomonas: POSITIVE — AB

## 2019-03-16 MED ORDER — METRONIDAZOLE 500 MG PO TABS: 500.0000 mg | ORAL_TABLET | Freq: Two times a day (BID) | ORAL | 0 refills | Status: AC

## 2019-03-16 MED ORDER — FLUCONAZOLE 150 MG PO TABS: 150.0000 mg | ORAL_TABLET | Freq: Once | ORAL | 0 refills | Status: AC

## 2019-03-16 NOTE — Telephone Encounter (Signed)
Bacterial vaginosis is positive. This was not treated at the urgent care visit.  Flagyl 500 mg BID x 7 days #14 no refills sent to patients pharmacy of choice.    Test for candida (yeast) was positive.  Prescription for fluconazole 150mg po now, repeat dose in 3d if needed, #2 no refills, sent to the pharmacy of record.  Recheck or followup with PCP for further evaluation if symptoms are not improving.    Trichomonas is positive. Rx  for Flagyl was sent to the pharmacy of record. Pt needs education to refrain from sexual intercourse for 7 days to give the medicine time to work. Sexual partners need to be notified and tested/treated. Condoms may reduce risk of reinfection. Recheck for further evaluation if symptoms are not improving.   Patient contacted and made aware of    results, all questions answered   

## 2019-08-17 ENCOUNTER — Other Ambulatory Visit: Payer: Self-pay

## 2019-08-17 ENCOUNTER — Encounter (HOSPITAL_COMMUNITY): Payer: Self-pay

## 2019-08-17 ENCOUNTER — Emergency Department (HOSPITAL_COMMUNITY): Payer: Self-pay

## 2019-08-17 ENCOUNTER — Inpatient Hospital Stay (HOSPITAL_COMMUNITY)
Admission: EM | Admit: 2019-08-17 | Discharge: 2019-08-21 | DRG: 872 | Disposition: A | Discharge status: Home / Self Care | Payer: Self-pay | Attending: Internal Medicine | Admitting: Internal Medicine

## 2019-08-17 DIAGNOSIS — R6510 Systemic inflammatory response syndrome (SIRS) of non-infectious origin without acute organ dysfunction: Secondary | ICD-10-CM

## 2019-08-17 DIAGNOSIS — A4151 Sepsis due to Escherichia coli [E. coli]: Principal | ICD-10-CM | POA: Diagnosis present

## 2019-08-17 DIAGNOSIS — I10 Essential (primary) hypertension: Secondary | ICD-10-CM | POA: Diagnosis present

## 2019-08-17 DIAGNOSIS — Z20822 Contact with and (suspected) exposure to covid-19: Secondary | ICD-10-CM | POA: Diagnosis present

## 2019-08-17 DIAGNOSIS — F1210 Cannabis abuse, uncomplicated: Secondary | ICD-10-CM | POA: Diagnosis present

## 2019-08-17 DIAGNOSIS — F32A Depression, unspecified: Secondary | ICD-10-CM | POA: Diagnosis present

## 2019-08-17 DIAGNOSIS — F1910 Other psychoactive substance abuse, uncomplicated: Secondary | ICD-10-CM | POA: Diagnosis present

## 2019-08-17 DIAGNOSIS — F329 Major depressive disorder, single episode, unspecified: Secondary | ICD-10-CM | POA: Diagnosis present

## 2019-08-17 DIAGNOSIS — Z8249 Family history of ischemic heart disease and other diseases of the circulatory system: Secondary | ICD-10-CM

## 2019-08-17 DIAGNOSIS — N12 Tubulo-interstitial nephritis, not specified as acute or chronic: Secondary | ICD-10-CM

## 2019-08-17 DIAGNOSIS — F1010 Alcohol abuse, uncomplicated: Secondary | ICD-10-CM | POA: Diagnosis present

## 2019-08-17 DIAGNOSIS — Z87820 Personal history of traumatic brain injury: Secondary | ICD-10-CM

## 2019-08-17 DIAGNOSIS — F17210 Nicotine dependence, cigarettes, uncomplicated: Secondary | ICD-10-CM | POA: Diagnosis present

## 2019-08-17 DIAGNOSIS — N10 Acute tubulo-interstitial nephritis: Secondary | ICD-10-CM | POA: Diagnosis present

## 2019-08-17 DIAGNOSIS — E876 Hypokalemia: Secondary | ICD-10-CM | POA: Diagnosis present

## 2019-08-17 LAB — CBC WITH DIFFERENTIAL/PLATELET
Abs Immature Granulocytes: 0.09 10*3/uL — ABNORMAL HIGH (ref 0.00–0.07)
Basophils Absolute: 0.1 10*3/uL (ref 0.0–0.1)
Basophils Relative: 0 %
Eosinophils Absolute: 0 10*3/uL (ref 0.0–0.5)
Eosinophils Relative: 0 %
HCT: 41.9 % (ref 36.0–46.0)
Hemoglobin: 13.8 g/dL (ref 12.0–15.0)
Immature Granulocytes: 1 %
Lymphocytes Relative: 7 %
Lymphs Abs: 1.1 10*3/uL (ref 0.7–4.0)
MCH: 30.8 pg (ref 26.0–34.0)
MCHC: 32.9 g/dL (ref 30.0–36.0)
MCV: 93.5 fL (ref 80.0–100.0)
Monocytes Absolute: 1.4 10*3/uL — ABNORMAL HIGH (ref 0.1–1.0)
Monocytes Relative: 9 %
Neutro Abs: 12.8 10*3/uL — ABNORMAL HIGH (ref 1.7–7.7)
Neutrophils Relative %: 83 %
Platelets: 318 10*3/uL (ref 150–400)
RBC: 4.48 MIL/uL (ref 3.87–5.11)
RDW: 15.4 % (ref 11.5–15.5)
WBC: 15.4 10*3/uL — ABNORMAL HIGH (ref 4.0–10.5)
nRBC: 0 % (ref 0.0–0.2)

## 2019-08-17 LAB — COMPREHENSIVE METABOLIC PANEL
ALT: 14 U/L (ref 0–44)
AST: 20 U/L (ref 15–41)
Albumin: 4.1 g/dL (ref 3.5–5.0)
Alkaline Phosphatase: 73 U/L (ref 38–126)
Anion gap: 12 (ref 5–15)
BUN: 6 mg/dL (ref 6–20)
CO2: 22 mmol/L (ref 22–32)
Calcium: 8.9 mg/dL (ref 8.9–10.3)
Chloride: 98 mmol/L (ref 98–111)
Creatinine, Ser: 0.8 mg/dL (ref 0.44–1.00)
GFR calc Af Amer: >60 mL/min (ref >60)
GFR calc non Af Amer: >60 mL/min (ref >60)
Glucose, Bld: 134 mg/dL — ABNORMAL HIGH (ref 70–99)
Potassium: 3.2 mmol/L — ABNORMAL LOW (ref 3.5–5.1)
Sodium: 132 mmol/L — ABNORMAL LOW (ref 135–145)
Total Bilirubin: 0.6 mg/dL (ref 0.3–1.2)
Total Protein: 7.9 g/dL (ref 6.5–8.1)

## 2019-08-17 LAB — URINALYSIS, ROUTINE W REFLEX MICROSCOPIC
Bilirubin Urine: NEGATIVE
Glucose, UA: NEGATIVE mg/dL
Ketones, ur: NEGATIVE mg/dL
Nitrite: POSITIVE — AB
Protein, ur: NEGATIVE mg/dL
Specific Gravity, Urine: 1.005 (ref 1.005–1.030)
WBC, UA: >50 WBC/hpf — ABNORMAL HIGH (ref 0–5)
pH: 6 (ref 5.0–8.0)

## 2019-08-17 LAB — PROTIME-INR
INR: 1 (ref 0.8–1.2)
Prothrombin Time: 13.5 seconds (ref 11.4–15.2)

## 2019-08-17 LAB — RAPID URINE DRUG SCREEN, HOSP PERFORMED
Amphetamines: NOT DETECTED
Barbiturates: NOT DETECTED
Benzodiazepines: NOT DETECTED
Cocaine: POSITIVE — AB
Opiates: NOT DETECTED
Tetrahydrocannabinol: POSITIVE — AB

## 2019-08-17 LAB — HIV ANTIBODY (ROUTINE TESTING W REFLEX): HIV Screen 4th Generation wRfx: NONREACTIVE

## 2019-08-17 LAB — PREGNANCY, URINE: Preg Test, Ur: NEGATIVE

## 2019-08-17 LAB — LACTIC ACID, PLASMA: Lactic Acid, Venous: 1.3 mmol/L (ref 0.5–1.9)

## 2019-08-17 LAB — APTT: aPTT: 39 seconds — ABNORMAL HIGH (ref 24–36)

## 2019-08-17 MED ORDER — OXYCODONE HCL 5 MG PO TABS: 10.0000 mg | ORAL_TABLET | Freq: Four times a day (QID) | ORAL | Status: DC | PRN

## 2019-08-17 MED ORDER — ENOXAPARIN SODIUM 40 MG/0.4ML ~~LOC~~ SOLN
40.0000 mg | SUBCUTANEOUS | Status: DC
  Administered 2019-08-17 – 2019-08-20 (×4): 40 mg via SUBCUTANEOUS
  Filled 2019-08-17 (×4): qty 0.4

## 2019-08-17 MED ORDER — ACETAMINOPHEN 325 MG PO TABS
650.0000 mg | ORAL_TABLET | Freq: Once | ORAL | Status: AC
  Administered 2019-08-17: 650 mg via ORAL
  Filled 2019-08-17: qty 2

## 2019-08-17 MED ORDER — SODIUM CHLORIDE 0.9 % IV BOLUS (SEPSIS)
1000.0000 mL | Freq: Once | INTRAVENOUS | Status: AC
  Administered 2019-08-17: 1000 mL via INTRAVENOUS

## 2019-08-17 MED ORDER — IBUPROFEN 400 MG PO TABS
400.0000 mg | ORAL_TABLET | Freq: Four times a day (QID) | ORAL | Status: DC | PRN
  Administered 2019-08-18 – 2019-08-19 (×4): 400 mg via ORAL
  Filled 2019-08-17 (×4): qty 1

## 2019-08-17 MED ORDER — OXYCODONE HCL 5 MG PO TABS: 5.0000 mg | ORAL_TABLET | Freq: Four times a day (QID) | ORAL | Status: DC | PRN

## 2019-08-17 MED ORDER — IBUPROFEN 400 MG PO TABS
600.0000 mg | ORAL_TABLET | Freq: Four times a day (QID) | ORAL | Status: AC
  Administered 2019-08-17 – 2019-08-18 (×3): 600 mg via ORAL
  Filled 2019-08-17 (×3): qty 1

## 2019-08-17 MED ORDER — ACETAMINOPHEN 325 MG PO TABS
650.0000 mg | ORAL_TABLET | Freq: Four times a day (QID) | ORAL | Status: DC | PRN
  Administered 2019-08-17 – 2019-08-20 (×5): 650 mg via ORAL
  Filled 2019-08-17 (×5): qty 2

## 2019-08-17 MED ORDER — SODIUM CHLORIDE (PF) 0.9 % IJ SOLN
INTRAMUSCULAR | Status: AC
  Filled 2019-08-17: qty 50

## 2019-08-17 MED ORDER — POLYETHYLENE GLYCOL 3350 17 G PO PACK
17.0000 g | PACK | Freq: Every day | ORAL | Status: DC | PRN
  Administered 2019-08-19 – 2019-08-20 (×2): 17 g via ORAL
  Filled 2019-08-17 (×2): qty 1

## 2019-08-17 MED ORDER — SODIUM CHLORIDE 0.9 % IV BOLUS (SEPSIS)
1000.0000 mL | Freq: Once | INTRAVENOUS | Status: AC
  Administered 2019-08-17: 12:00:00 1000 mL via INTRAVENOUS

## 2019-08-17 MED ORDER — HYDROMORPHONE HCL 1 MG/ML IJ SOLN
0.5000 mg | Freq: Once | INTRAMUSCULAR | Status: AC
  Administered 2019-08-17: 0.5 mg via INTRAVENOUS
  Filled 2019-08-17: qty 1

## 2019-08-17 MED ORDER — POTASSIUM CHLORIDE 20 MEQ PO PACK
40.0000 meq | PACK | Freq: Once | ORAL | Status: AC
  Administered 2019-08-17: 40 meq via ORAL
  Filled 2019-08-17: qty 2

## 2019-08-17 MED ORDER — ACETAMINOPHEN 650 MG RE SUPP: 650.0000 mg | Freq: Four times a day (QID) | RECTAL | Status: DC | PRN

## 2019-08-17 MED ORDER — LACTATED RINGERS IV SOLN: INTRAVENOUS | Status: DC

## 2019-08-17 MED ORDER — ONDANSETRON HCL 4 MG/2ML IJ SOLN
4.0000 mg | Freq: Once | INTRAMUSCULAR | Status: AC
  Administered 2019-08-17: 4 mg via INTRAVENOUS
  Filled 2019-08-17: qty 2

## 2019-08-17 MED ORDER — IOHEXOL 300 MG/ML  SOLN
100.0000 mL | Freq: Once | INTRAMUSCULAR | Status: AC | PRN
  Administered 2019-08-17: 100 mL via INTRAVENOUS

## 2019-08-17 MED ORDER — SODIUM CHLORIDE 0.9 % IV SOLN
1.0000 g | INTRAVENOUS | Status: DC
  Administered 2019-08-17: 1 g via INTRAVENOUS
  Filled 2019-08-17: qty 10

## 2019-08-17 MED ORDER — OXYCODONE HCL 5 MG PO TABS
5.0000 mg | ORAL_TABLET | Freq: Four times a day (QID) | ORAL | Status: DC | PRN
  Administered 2019-08-17 – 2019-08-20 (×6): 5 mg via ORAL
  Filled 2019-08-17 (×6): qty 1

## 2019-08-17 NOTE — ED Notes (Signed)
Pt transported to CT ?

## 2019-08-17 NOTE — ED Triage Notes (Signed)
Pt BIB EMS. Pt reports left flank pain x4 days and pulling sensation in flank while urinating. Denies N/V/D.

## 2019-08-17 NOTE — Progress Notes (Signed)
Pt temp 103.2 making her a MEWS score 2 MD notified. Tylenol given, MEWS protocol started.

## 2019-08-17 NOTE — Progress Notes (Addendum)
Patient had MEWs of yellow when given report. MEWs is not an acute change due to patient's temp of 103.2.  Patient was administered 650mg  PO of tylenol per day nurse, but temp is still rising. MD on call paged and informed that patient's temp is still rising and currently 103.1. I have applied ice packs to patient throughout body and cool wash cloth applied to forehead. Given IS to use as well to help bring temp down. Patient is stable, being monitored and new order for Ibuprofen will be given, see MAR for more info. , RN

## 2019-08-17 NOTE — H&P (Addendum)
History and Physical    DOA: 08/17/2019  PCP: Department, [facility]  Patient coming from: home  Chief Complaint: flank pain  HPI: Tiffany [name] is a 34 y.o. female with history h/o polysubstance abuse (marijuana, cocaine, alcohol), depression, PID, hypertension but not on any medications, prior UTIs presents with complaints of left flank pain.  Patient states she was having cramping and a sensation of bladder outlet restriction while urinating.  Denies any hematuria.  She has also been having fever and chills for 2 days.  She denies any recent IV drug abuse but reports smoking marijuana and drinking alcohol yesterday.  Her last sexual encounter was a month back and LMP 2 weeks back. ED course: Temp 102.86F, heart rate 60, blood pressure 118/100, O2 sat 99% on room air.  Labs with sodium 132, potassium 3.2, chloride 98, bicarb 22, BUN 6, creatinine 1.8, ALP 73, WBC 15.4, hemoglobin 13.8, platelets 318, urine pregnancy test negative, glucose 134, PTT 39, INR 1.0, lactic acid 1.3.  Urine analysis showed nitrite positive, large hemoglobin, rare bacteria, RBC 11-20, WBC greater than 50.  Patient received 1 dose of IV Rocephin in the ED and [facility] consulted for admission.   Review of Systems: As per HPI otherwise 10 point review of systems negative.    Past Medical History:  Diagnosis Date  . Addiction, marijuana (HCC)   . Alcohol abuse   . Chlamydia   . Cocaine addiction (HCC)   . Depression   . History of concussion   . Hypertension    does not take any meds    Past Surgical History:  Procedure Laterality Date  . WISDOM TOOTH EXTRACTION  2007    Social history:  reports that she has been smoking cigarettes. She has a 9.00 pack-year smoking history. She has never used smokeless tobacco. She reports current alcohol use of about 13.0 standard drinks of alcohol per week. She reports current drug use. Frequency: 1.00 time per week. Drugs: Marijuana and Cocaine.   No Known  Allergies  Family History  Problem Relation Age of Onset  . Hypertension Other   . Diabetes Other   . Heart failure Mother   . Hypertension Mother       Prior to Admission medications   Medication Sig Start Date End Date Taking? Authorizing Provider  doxycycline (VIBRAMYCIN) 100 MG capsule Take 1 capsule (100 mg total) by mouth 2 (two) times daily. One po bid x 14 days Patient not taking: Reported on 08/17/2019 07/04/17   Doug Sou, MD  ibuprofen (ADVIL,MOTRIN) 600 MG tablet Take 1 tablet (600 mg total) by mouth every 8 (eight) hours as needed (Pain.  Take with food). Patient not taking: Reported on 08/17/2019 07/04/17   Doug Sou, MD    Physical Exam: Vitals:   08/17/19 1600 08/17/19 1630 08/17/19 1730 08/17/19 1800  BP: 112/72 116/72 (!) 127/92 139/77  Pulse: 84 87 88 90  Resp: 18 16 18 16   Temp:      TempSrc:      SpO2: 100% 100% 98% 97%    Constitutional: Young female who appears in mild distress due to chills and asking for blankets Eyes: PERRL, lids and conjunctivae normal ENMT: Mucous membranes are moist. Posterior pharynx clear of any exudate or lesions.Normal dentition.  Neck: normal, supple, no masses, no thyromegaly Respiratory: clear to auscultation bilaterally, no wheezing, no crackles. Normal respiratory effort. No accessory muscle use.  Cardiovascular: Regular rate and rhythm, no murmurs / rubs / gallops. No extremity  edema. 2+ pedal pulses. No carotid bruits.  Abdomen: Left flank/CVA  tenderness, no masses palpated. No hepatosplenomegaly. Bowel sounds positive.  Musculoskeletal: no clubbing / cyanosis. No joint deformity upper and lower extremities. Good ROM, no contractures. Normal muscle tone.  Neurologic: CN 2-12 grossly intact. Sensation intact, DTR normal. Strength 5/5 in all 4.  Psychiatric: Normal judgment and insight. Alert and oriented x 3. Normal mood.  SKIN/catheters: no rashes, lesions, ulcers. No induration  Labs on Admission: I have  personally reviewed following labs and imaging studies  CBC: Recent Labs  Lab 08/17/19 1201  WBC 15.4*  NEUTROABS 12.8*  HGB 13.8  HCT 41.9  MCV 93.5  PLT 318   Basic Metabolic Panel: Recent Labs  Lab 08/17/19 1201  NA 132*  K 3.2*  CL 98  CO2 22  GLUCOSE 134*  BUN 6  CREATININE 0.80  CALCIUM 8.9   GFR: CrCl cannot be calculated (Unknown ideal weight.). Recent Labs  Lab 08/17/19 1201  WBC 15.4*  LATICACIDVEN 1.3   Liver Function Tests: Recent Labs  Lab 08/17/19 1201  AST 20  ALT 14  ALKPHOS 73  BILITOT 0.6  PROT 7.9  ALBUMIN 4.1   No results for input(s): LIPASE, AMYLASE in the last 168 hours. No results for input(s): AMMONIA in the last 168 hours. Coagulation Profile: Recent Labs  Lab 08/17/19 1201  INR 1.0   Cardiac Enzymes: No results for input(s): CKTOTAL, CKMB, CKMBINDEX, TROPONINI in the last 168 hours. BNP (last 3 results) No results for input(s): PROBNP in the last 8760 hours. HbA1C: No results for input(s): HGBA1C in the last 72 hours. CBG: No results for input(s): GLUCAP in the last 168 hours. Lipid Profile: No results for input(s): CHOL, HDL, LDLCALC, TRIG, CHOLHDL, LDLDIRECT in the last 72 hours. Thyroid Function Tests: No results for input(s): TSH, T4TOTAL, FREET4, T3FREE, THYROIDAB in the last 72 hours. Anemia Panel: No results for input(s): VITAMINB12, FOLATE, FERRITIN, TIBC, IRON, RETICCTPCT in the last 72 hours. Urine analysis:    Component Value Date/Time   COLORURINE YELLOW 08/17/2019 1201   APPEARANCEUR CLEAR 08/17/2019 1201   LABSPEC 1.005 08/17/2019 1201   PHURINE 6.0 08/17/2019 1201   GLUCOSEU NEGATIVE 08/17/2019 1201   HGBUR LARGE (A) 08/17/2019 1201   BILIRUBINUR NEGATIVE 08/17/2019 1201   KETONESUR NEGATIVE 08/17/2019 1201   PROTEINUR NEGATIVE 08/17/2019 1201   UROBILINOGEN 0.2 03/11/2019 1615   NITRITE POSITIVE (A) 08/17/2019 1201   LEUKOCYTESUR MODERATE (A) 08/17/2019 1201    Radiological Exams on  Admission: Personally reviewed  CT ABDOMEN PELVIS W CONTRAST  Result Date: 08/17/2019 CLINICAL DATA:  Acute nonlocalized abdominal pain. EXAM: CT ABDOMEN AND PELVIS WITH CONTRAST TECHNIQUE: Multidetector CT imaging of the abdomen and pelvis was performed using the standard protocol following bolus administration of intravenous contrast. CONTRAST:  OMNIPAQUE IOHEXOL 300 MG/ML  SOLN COMPARISON:  CT 07/04/2017 FINDINGS: Lower chest: Accessory fissure at the right lung base. No acute findings. No focal airspace disease or pleural fluid. Hepatobiliary: There tiny scattered hepatic hypodensities that are too small to accurately characterize. Gallbladder physiologically distended, no calcified stone. No biliary dilatation. Pancreas: No ductal dilatation or inflammation. Spleen: Normal in size without focal abnormality. Adrenals/Urinary Tract: Normal adrenal glands. Heterogeneous left renal enhancement. Ill-defined cortical hypodensity, series 2, image 23. mild left perinephric edema. No hydronephrosis. There is mild left ureteral enhancement. Homogeneous right renal enhancement. No focal right renal abnormality. Right ureter decompressed. Distended urinary bladder without wall thickening. Stomach/Bowel: Bowel evaluation is limited in the  absence of enteric contrast and paucity of intra-abdominal fat. Stomach partially distended. Normal positioning of the ligament of Treitz. Central small bowel loops are fluid-filled but nondilated. There is no evidence of obstruction. No definite small bowel inflammation or wall thickening. Normal air-filled appendix in the right lower quadrant. Small volume of colonic stool. Sigmoid colon is redundant. No colonic wall thickening. Vascular/Lymphatic: Mild aortic atherosclerosis, age advanced. Portal vein is patent. Mesenteric vessels are grossly patent. Prominent adnexal and periuterine vascularity with dilatation of the left ovarian vein 6 mm with reflux of contrast. Few  prominent left retroperitoneal nodes are nonspecific but likely reactive. Reproductive: Probable corpus luteal cyst in the right ovary. Uterus and left adnexa are unremarkable. There is prominent periuterine and adnexal vascularity. Other: Small amount of free fluid in the pelvis. No free air. Musculoskeletal: There are no acute or suspicious osseous abnormalities. IMPRESSION: 1. Findings consistent with left pyelonephritis. Ill-defined low-density in the left upper kidney concerning for phlegmon formation, no drainable fluid collection/abscess. 2. Prominent adnexal and periuterine vascularity with dilatation of the left ovarian vein, can be seen with pelvic congestion syndrome. 3. Few prominent fluid-filled loops of small bowel in the central abdomen, likely reactive. 4. Aortic atherosclerosis, mild but age advanced. Aortic Atherosclerosis (ICD10-I70.0). Aortic Atherosclerosis (ICD10-I70.0). Electronically Signed   By: Narda Rutherford M.D.   On: 08/17/2019 17:42    EKG: Not done in ED    Assessment and Plan:   Principal Problem:   Acute pyelonephritis Active Problems:   Polysubstance abuse (HCC)   Depression   SIRS (systemic inflammatory response syndrome) (HCC)   Hypokalemia    1.  Acute pyelonephritis with systemic inflammatory response syndrome: Patient has leukocytosis with significant CVA tenderness, fever and chills.  Her vital signs however are stable and lactate level unremarkable.  Admit with IV fluids and IV antibiotics, follow-up urine and blood cultures sent from ED.  Given hemoglobinuria and patient's clinical presentation, suspect renal/ureteral stones.  CT with contrast ordered through ED to rule out stone/microabscess, will follow up results.  Repeat CBC in a.m. Will consult urology if CT shows any obstructing stones.   2.  Polysubstance abuse: Patient reports using marijuana but denies any other drug abuse.  Urine drug screen pending.  3.  Alcohol abuse: Patient admits to  drinking alcohol yesterday but denies regular use or dependence.  Watch for withdrawals with CIWA protocol.  Thiamine/Multi vitamin  4.?  Hypertension: Unsure of this diagnosis.  Not on any medications.  Not sure if it was elevated blood pressure in the setting of drug use in the past.  Monitor for now  5.  Hypokalemia: Replace orally and repeat labs in a.m.  DVT prophylaxis: Lovenox  COVID screen: Pending  Code Status: Full code.Health care proxy would be her mother  Patient/Family Communication: Discussed with patient and all questions answered to satisfaction.  Consults called: None Admission status :I certify that at the point of admission it is my clinical judgment that the patient will require inpatient hospital care spanning beyond 2 midnights from the point of admission due to high intensity of service and high frequency of surveillance required.Inpatient status is judged to be reasonable and necessary in order to provide the required intensity of service to ensure the patient's safety. The patient's presenting symptoms, physical exam findings, and initial radiographic and laboratory data in the context of their chronic comorbidities is felt to place them at high risk for further clinical deterioration. The following factors support the patient status of inpatient :  Acute pyelonephritis with SIRS requiring IV antibiotics/IV fluids.     Guilford Shi MD [facility] Pager in [place]  If 7PM-7AM, please contact night-coverage [email or web address]   08/17/2019, 6:08 PM

## 2019-08-17 NOTE — ED Provider Notes (Signed)
[facility]-EMERGENCY DEPT Provider Note   CSN: [id number] Arrival date & time: 08/17/19  1129     History Chief Complaint  Patient presents with  . Flank Pain    Tiffany [name] is a 34 y.o. female.  The history is provided by the patient. No language interpreter was used.  Flank Pain This is a new problem. The current episode started yesterday. The problem has been rapidly worsening. Associated symptoms include abdominal pain. Nothing aggravates the symptoms. Nothing relieves the symptoms. She has tried nothing for the symptoms. The treatment provided no relief.       Past Medical History:  Diagnosis Date  . Addiction, marijuana (HCC)   . Alcohol abuse   . Chlamydia   . Cocaine addiction (HCC)   . Depression   . History of concussion   . Hypertension    does not take any meds    Patient Active Problem List   Diagnosis Date Noted  . Incomplete miscarriage 05/13/2012  . Polysubstance abuse (HCC) 10/21/2011  . Depression 10/21/2011    Past Surgical History:  Procedure Laterality Date  . WISDOM TOOTH EXTRACTION  2007     OB History    Gravida  3   Para  1   Term      Preterm      AB  1   Living  1     SAB  1   TAB      Ectopic      Multiple      Live Births              Family History  Problem Relation Age of Onset  . Hypertension Other   . Diabetes Other   . Heart failure Mother   . Hypertension Mother     Social History   Tobacco Use  . Smoking status: Current Every Day Smoker    Packs/day: 1.00    Years: 9.00    Pack years: 9.00    Types: Cigarettes  . Smokeless tobacco: Never Used  Substance Use Topics  . Alcohol use: Yes    Alcohol/week: 13.0 standard drinks    Types: 12 Cans of beer, 1 Standard drinks or equivalent per week    Comment: daily  . Drug use: Yes    Frequency: 1.0 times per week    Types: Marijuana, Cocaine    Comment: hydrocodone from family member, crack and powder cocaine     Home Medications Prior to Admission medications   Medication Sig Start Date End Date Taking? Authorizing Provider  doxycycline (VIBRAMYCIN) 100 MG capsule Take 1 capsule (100 mg total) by mouth 2 (two) times daily. One po bid x 14 days 07/04/17   Doug Sou, MD  ibuprofen (ADVIL,MOTRIN) 600 MG tablet Take 1 tablet (600 mg total) by mouth every 8 (eight) hours as needed (Pain.  Take with food). 07/04/17   Doug Sou, MD    Allergies    Patient has no known allergies.  Review of Systems   Review of Systems  Gastrointestinal: Positive for abdominal pain.  Genitourinary: Positive for flank pain.  All other systems reviewed and are negative.   Physical Exam Updated Vital Signs There were no vitals taken for this visit.  Physical Exam Vitals and nursing note reviewed.  Constitutional:      Appearance: She is well-developed.  HENT:     Head: Normocephalic.     Nose: Nose normal.     Mouth/Throat:  Mouth: Mucous membranes are moist.  Cardiovascular:     Rate and Rhythm: Normal rate.  Pulmonary:     Effort: Pulmonary effort is normal.  Abdominal:     General: Abdomen is flat. There is no distension.  Musculoskeletal:        General: Normal range of motion.     Cervical back: Normal range of motion.  Skin:    General: Skin is warm.  Neurological:     General: No focal deficit present.     Mental Status: She is alert and oriented to person, place, and time.  Psychiatric:        Mood and Affect: Mood normal.     ED Results / Procedures / Treatments   Labs (all labs ordered are listed, but only abnormal results are displayed) Labs Reviewed - No data to display  EKG None  Radiology No results found.  Procedures Procedures (including critical care time)  Medications Ordered in ED Medications - No data to display  ED Course  I have reviewed the triage vital signs and the nursing notes.  Pertinent labs & imaging results that were available during  my care of the patient were reviewed by me and considered in my medical decision making (see chart for details).    MDM Rules/Calculators/A&P                      MDM: Pt has temp of 102.  Pt given Iv fluids and Rocephin 1 gram. Lactic acid is normal   Pt has an elevated wbc count.  Pt reports she still feels bad.  Pt unable to drink due to nausea. No relief from zofran  I spoke with Hospitalist who will admit Final Clinical Impression(s) / ED Diagnoses Final diagnoses:  Pyelonephritis    Rx / DC Orders ED Discharge Orders    None       Sidney Ace 08/17/19 1731    Lajean Saver, MD 08/18/19 [phone]

## 2019-08-17 NOTE — ED Notes (Signed)
ED TO INPATIENT HANDOFF REPORT  Name/Age/Gender Tiffany [name] 34 y.o. female  Code Status    Code Status Orders  (From admission, onward)         Start     Ordered   08/17/19 1657  Full code  Continuous     08/17/19 1700        Code Status History    Date Active Date Inactive Code Status Order ID Comments User Context   02/13/2012 0931 02/14/2012 2224 Full Code [id number]  Leota Jacobsen, MD ED   11/04/2011 2312 11/05/2011 2248 Full Code [id number]  Anne Ng, [place] ED   10/20/2011 0334 10/20/2011 2150 Full Code [id number]  Chauncy Passy, MD ED   Advance Care Planning Activity      Home/SNF/Other Home  Chief Complaint Acute pyelonephritis [N10]  Level of Care/Admitting Diagnosis ED Disposition    ED Disposition Condition [facility] Area: [facility] [[id number]]  Level of Care: Med-Surg [16]  May admit patient to [facility] or [facility] if equivalent level of care is available:: Yes  Covid Evaluation: Asymptomatic Screening Protocol (No Symptoms)  Diagnosis: Acute pyelonephritis [329518]  Admitting Physician: Guilford Shi [[id number]]  Attending Physician: Guilford Shi [[id number]]  Estimated length of stay: past midnight tomorrow  Certification:: I certify this patient will need inpatient services for at least 2 midnights       Medical History Past Medical History:  Diagnosis Date  . Addiction, marijuana ([place])   . Alcohol abuse   . Chlamydia   . Cocaine addiction ([place])   . Depression   . History of concussion   . Hypertension    does not take any meds    Allergies No Known Allergies  IV Location/Drains/Wounds Patient Lines/Drains/Airways Status   Active Line/Drains/Airways    Name:   Placement date:   Placement time:   Site:   Days:   Peripheral IV 08/17/19 Right Antecubital   08/17/19    1213    Antecubital   less than 1   Peripheral IV 08/17/19 Left Antecubital   08/17/19    1218    Antecubital   less than 1           Labs/Imaging Results for orders placed or performed during the hospital encounter of 08/17/19 (from the past 48 hour(s))  CBC with Differential     Status: Abnormal   Collection Time: 08/17/19 12:01 PM  Result Value Ref Range   WBC 15.4 (H) 4.0 - 10.5 K/uL   RBC 4.48 3.87 - 5.11 MIL/uL   Hemoglobin 13.8 12.0 - 15.0 g/dL   HCT 41.9 36.0 - 46.0 %   MCV 93.5 80.0 - 100.0 fL   MCH 30.8 26.0 - 34.0 pg   MCHC 32.9 30.0 - 36.0 g/dL   RDW 15.4 11.5 - 15.5 %   Platelets 318 150 - 400 K/uL   nRBC 0.0 0.0 - 0.2 %   Neutrophils Relative % 83 %   Neutro Abs 12.8 (H) 1.7 - 7.7 K/uL   Lymphocytes Relative 7 %   Lymphs Abs 1.1 0.7 - 4.0 K/uL   Monocytes Relative 9 %   Monocytes Absolute 1.4 (H) 0.1 - 1.0 K/uL   Eosinophils Relative 0 %   Eosinophils Absolute 0.0 0.0 - 0.5 K/uL   Basophils Relative 0 %   Basophils Absolute 0.1 0.0 - 0.1 K/uL   Immature Granulocytes 1 %   Abs Immature Granulocytes 0.09 (H) 0.00 - 0.07  K/uL    Comment: Performed at [facility], [address] [place], [place] [address]  Comprehensive metabolic panel     Status: Abnormal   Collection Time: 08/17/19 12:01 PM  Result Value Ref Range   Sodium 132 (L) 135 - 145 mmol/L   Potassium 3.2 (L) 3.5 - 5.1 mmol/L   Chloride 98 98 - 111 mmol/L   CO2 22 22 - 32 mmol/L   Glucose, Bld 134 (H) 70 - 99 mg/dL    Comment: Glucose reference range applies only to samples taken after fasting for at least 8 hours.   BUN 6 6 - 20 mg/dL   Creatinine, Ser 7.79 0.44 - 1.00 mg/dL   Calcium 8.9 8.9 - 39.0 mg/dL   Total Protein 7.9 6.5 - 8.1 g/dL   Albumin 4.1 3.5 - 5.0 g/dL   AST 20 15 - 41 U/L   ALT 14 0 - 44 U/L   Alkaline Phosphatase 73 38 - 126 U/L   Total Bilirubin 0.6 0.3 - 1.2 mg/dL   GFR calc non Af Amer >60 >60 mL/min   GFR calc Af Amer >60 >60 mL/min   Anion gap 12 5 - 15    Comment: Performed at [facility], [address], [place], [place] [address]  Lactic acid, plasma      Status: None   Collection Time: 08/17/19 12:01 PM  Result Value Ref Range   Lactic Acid, Venous 1.3 0.5 - 1.9 mmol/L    Comment: Performed at [facility], [address] [place], [place] [address]  Protime-INR     Status: None   Collection Time: 08/17/19 12:01 PM  Result Value Ref Range   Prothrombin Time 13.5 11.4 - 15.2 seconds   INR 1.0 0.8 - 1.2    Comment: (NOTE) INR goal varies based on device and disease states. Performed at [facility], [address], [place], [place] [address]   APTT     Status: Abnormal   Collection Time: 08/17/19 12:01 PM  Result Value Ref Range   aPTT 39 (H) 24 - 36 seconds    Comment:        IF BASELINE aPTT IS ELEVATED, SUGGEST PATIENT RISK ASSESSMENT BE USED TO DETERMINE APPROPRIATE ANTICOAGULANT THERAPY. Performed at [facility], [address] [place], [place] [address]   Urinalysis, Routine w reflex microscopic     Status: Abnormal   Collection Time: 08/17/19 12:01 PM  Result Value Ref Range   Color, Urine YELLOW YELLOW   APPearance CLEAR CLEAR   Specific Gravity, Urine 1.005 1.005 - 1.030   pH 6.0 5.0 - 8.0   Glucose, UA NEGATIVE NEGATIVE mg/dL   Hgb urine dipstick LARGE (A) NEGATIVE   Bilirubin Urine NEGATIVE NEGATIVE   Ketones, ur NEGATIVE NEGATIVE mg/dL   Protein, ur NEGATIVE NEGATIVE mg/dL   Nitrite POSITIVE (A) NEGATIVE   Leukocytes,Ua MODERATE (A) NEGATIVE   RBC / HPF 11-20 0 - 5 RBC/hpf   WBC, UA >50 (H) 0 - 5 WBC/hpf   Bacteria, UA RARE (A) NONE SEEN   Mucus PRESENT     Comment: Performed at [facility], [address] [place], [place] [address]  Pregnancy, urine     Status: None   Collection Time: 08/17/19 12:01 PM  Result Value Ref Range   Preg Test, Ur NEGATIVE NEGATIVE    Comment:        THE SENSITIVITY OF THIS METHODOLOGY IS >  20 mIU/mL. Performed at [facility], [address] [place], [place] [address]   Rapid  urine drug screen (hospital performed)     Status: Abnormal   Collection Time: 08/17/19 12:01 PM  Result Value Ref Range   Opiates NONE DETECTED NONE DETECTED   Cocaine POSITIVE (A) NONE DETECTED   Benzodiazepines NONE DETECTED NONE DETECTED   Amphetamines NONE DETECTED NONE DETECTED   Tetrahydrocannabinol POSITIVE (A) NONE DETECTED   Barbiturates NONE DETECTED NONE DETECTED    Comment: (NOTE) DRUG SCREEN FOR MEDICAL PURPOSES ONLY.  IF CONFIRMATION IS NEEDED FOR ANY PURPOSE, NOTIFY LAB WITHIN 5 DAYS. LOWEST DETECTABLE LIMITS FOR URINE DRUG SCREEN Drug Class                     Cutoff (ng/mL) Amphetamine and metabolites    1000 Barbiturate and metabolites    200 Benzodiazepine                 200 Tricyclics and metabolites     300 Opiates and metabolites        300 Cocaine and metabolites        300 THC                            50 Performed at [facility], [address] [place], [place] [address]   Culture, blood (x 2)     Status: None (Preliminary result)   Collection Time: 08/17/19 12:02 PM   Specimen: BLOOD  Result Value Ref Range   Specimen Description      BLOOD RIGHT ANTECUBITAL Performed at [facility], [address] [place], [place] [address]    Special Requests      BOTTLES DRAWN AEROBIC AND ANAEROBIC Blood Culture adequate volume Performed at [facility], [address] [place], [place] [address]    Culture PENDING    Report Status PENDING    CT ABDOMEN PELVIS W CONTRAST  Result Date: 08/17/2019 CLINICAL DATA:  Acute nonlocalized abdominal pain. EXAM: CT ABDOMEN AND PELVIS WITH CONTRAST TECHNIQUE: Multidetector CT imaging of the abdomen and pelvis was performed using the standard protocol following bolus administration of intravenous contrast. CONTRAST:  100mL OMNIPAQUE IOHEXOL 300 MG/ML  SOLN COMPARISON:  CT 07/04/2017 FINDINGS: Lower chest: Accessory fissure at the right lung base. No acute findings. No focal  airspace disease or pleural fluid. Hepatobiliary: There tiny scattered hepatic hypodensities that are too small to accurately characterize. Gallbladder physiologically distended, no calcified stone. No biliary dilatation. Pancreas: No ductal dilatation or inflammation. Spleen: Normal in size without focal abnormality. Adrenals/Urinary Tract: Normal adrenal glands. Heterogeneous left renal enhancement. Ill-defined cortical hypodensity, series 2, image 23. mild left perinephric edema. No hydronephrosis. There is mild left ureteral enhancement. Homogeneous right renal enhancement. No focal right renal abnormality. Right ureter decompressed. Distended urinary bladder without wall thickening. Stomach/Bowel: Bowel evaluation is limited in the absence of enteric contrast and paucity of intra-abdominal fat. Stomach partially distended. Normal positioning of the ligament of Treitz. Central small bowel loops are fluid-filled but nondilated. There is no evidence of obstruction. No definite small bowel inflammation or wall thickening. Normal air-filled appendix in the right lower quadrant. Small volume of colonic stool. Sigmoid colon is redundant. No colonic wall thickening. Vascular/Lymphatic: Mild aortic atherosclerosis, age advanced. Portal vein is patent. Mesenteric vessels are grossly patent. Prominent adnexal and periuterine vascularity with dilatation of the left ovarian vein 6  mm with reflux of contrast. Few prominent left retroperitoneal nodes are nonspecific but likely reactive. Reproductive: Probable corpus luteal cyst in the right ovary. Uterus and left adnexa are unremarkable. There is prominent periuterine and adnexal vascularity. Other: Small amount of free fluid in the pelvis. No free air. Musculoskeletal: There are no acute or suspicious osseous abnormalities. IMPRESSION: 1. Findings consistent with left pyelonephritis. Ill-defined low-density in the left upper kidney concerning for phlegmon formation, no  drainable fluid collection/abscess. 2. Prominent adnexal and periuterine vascularity with dilatation of the left ovarian vein, can be seen with pelvic congestion syndrome. 3. Few prominent fluid-filled loops of small bowel in the central abdomen, likely reactive. 4. Aortic atherosclerosis, mild but age advanced. Aortic Atherosclerosis (ICD10-I70.0). Aortic Atherosclerosis (ICD10-I70.0). Electronically Signed   By: Narda Rutherford M.D.   On: 08/17/2019 17:42    Pending Labs Unresulted Labs (From admission, onward)    Start     Ordered   08/24/19 0500  Creatinine, serum  (enoxaparin (LOVENOX)    CrCl >/= 30 ml/min)  Weekly,   R    Comments: while on enoxaparin therapy    08/17/19 1700   08/18/19 0500  Basic metabolic panel  Tomorrow morning,   R     08/17/19 1700   08/18/19 0500  CBC  Tomorrow morning,   R     08/17/19 1700   08/17/19 1656  HIV Antibody (routine testing w rflx)  (HIV Antibody (Routine testing w reflex) panel)  Once,   STAT     08/17/19 1700   08/17/19 1622  SARS CORONAVIRUS 2 (TAT 6-24 HRS) Nasopharyngeal Nasopharyngeal Swab  (Tier 3 (TAT 6-24 hrs))  Once,   STAT    Question Answer Comment  Is this test for diagnosis or screening Diagnosis of ill patient   Symptomatic for COVID-19 as defined by CDC Yes   Date of Symptom Onset 08/17/2019   Hospitalized for COVID-19 No   Admitted to ICU for COVID-19 No   Previously tested for COVID-19 No   Resident in a congregate (group) care setting No   Employed in healthcare setting No   Pregnant No      08/17/19 1621   08/17/19 1159  Urine culture  ONCE - STAT,   STAT    Question:  Patient immune status  Answer:  Normal   08/17/19 1200   08/17/19 1157  Culture, blood (x 2)  BLOOD CULTURE X 2,   STAT    Comments: INITIATE ANTIBIOTICS WITHIN 1 HOUR AFTER BLOOD CULTURES DRAWN.  If unable to obtain blood cultures, call MD immediately regarding antibiotic instructions.   Question:  Patient immune status  Answer:  Normal   08/17/19  1200          Vitals/Pain Today's Vitals   08/17/19 1530 08/17/19 1600 08/17/19 1630 08/17/19 1730  BP: 110/62 112/72 116/72 (!) 127/92  Pulse: 80 84 87 88  Resp: 16 18 16 18   Temp:      TempSrc:      SpO2: 100% 100% 100% 98%  PainSc:        Isolation Precautions No active isolations  Medications Medications  cefTRIAXone (ROCEPHIN) 1 g in sodium chloride 0.9 % 100 mL IVPB (0 g Intravenous Stopped 08/17/19 1323)  sodium chloride (PF) 0.9 % injection (has no administration in time range)  enoxaparin (LOVENOX) injection 40 mg (has no administration in time range)  lactated ringers infusion (has no administration in time range)  acetaminophen (TYLENOL) tablet 650 mg (has no  administration in time range)    Or  acetaminophen (TYLENOL) suppository 650 mg (has no administration in time range)  ibuprofen (ADVIL) tablet 400 mg (has no administration in time range)  polyethylene glycol (MIRALAX / GLYCOLAX) packet 17 g (has no administration in time range)  potassium chloride (KLOR-CON) packet 40 mEq (has no administration in time range)  sodium chloride 0.9 % bolus 1,000 mL (0 mLs Intravenous Stopped 08/17/19 1447)    And  sodium chloride 0.9 % bolus 1,000 mL (0 mLs Intravenous Stopped 08/17/19 1447)  acetaminophen (TYLENOL) tablet 650 mg (650 mg Oral Given 08/17/19 1207)  HYDROmorphone (DILAUDID) injection 0.5 mg (0.5 mg Intravenous Given 08/17/19 1502)  ondansetron (ZOFRAN) injection 4 mg (4 mg Intravenous Given 08/17/19 1502)  iohexol (OMNIPAQUE) 300 MG/ML solution 100 mL (100 mLs Intravenous Contrast Given 08/17/19 1717)    Mobility walks

## 2019-08-18 LAB — BLOOD CULTURE ID PANEL (REFLEXED)

## 2019-08-18 LAB — BASIC METABOLIC PANEL
Anion gap: 8 (ref 5–15)
BUN: 7 mg/dL (ref 6–20)
CO2: 22 mmol/L (ref 22–32)
Calcium: 8.4 mg/dL — ABNORMAL LOW (ref 8.9–10.3)
Chloride: 107 mmol/L (ref 98–111)
Creatinine, Ser: 0.93 mg/dL (ref 0.44–1.00)
GFR calc Af Amer: >60 mL/min (ref >60)
GFR calc non Af Amer: >60 mL/min (ref >60)
Glucose, Bld: 133 mg/dL — ABNORMAL HIGH (ref 70–99)
Potassium: 4.1 mmol/L (ref 3.5–5.1)
Sodium: 137 mmol/L (ref 135–145)

## 2019-08-18 LAB — CBC
HCT: 38 % (ref 36.0–46.0)
Hemoglobin: 12.2 g/dL (ref 12.0–15.0)
MCH: 31 pg (ref 26.0–34.0)
MCHC: 32.1 g/dL (ref 30.0–36.0)
MCV: 96.4 fL (ref 80.0–100.0)
Platelets: 232 10*3/uL (ref 150–400)
RBC: 3.94 MIL/uL (ref 3.87–5.11)
RDW: 15.6 % — ABNORMAL HIGH (ref 11.5–15.5)
WBC: 11.3 10*3/uL — ABNORMAL HIGH (ref 4.0–10.5)
nRBC: 0 % (ref 0.0–0.2)

## 2019-08-18 LAB — URINE CULTURE: Special Requests: NORMAL

## 2019-08-18 LAB — SARS CORONAVIRUS 2 (TAT 6-24 HRS): SARS Coronavirus 2: NEGATIVE

## 2019-08-18 MED ORDER — SODIUM CHLORIDE 0.9 % IV SOLN
2.0000 g | INTRAVENOUS | Status: DC
  Administered 2019-08-18 – 2019-08-20 (×3): 2 g via INTRAVENOUS
  Filled 2019-08-18 (×3): qty 2

## 2019-08-18 NOTE — Progress Notes (Signed)
PROGRESS NOTE  Tiffany [name] FWY:[id number] DOB: 1985/12/02 DOA: 08/17/2019 PCP: Department, [facility]  HPI/Recap of past 24 hours:  Tiffany [name] is a 34 y.o. female with history h/o polysubstance abuse (marijuana, cocaine, alcohol), depression, PID, hypertension but not on any medications, prior UTIs presents with complaints of left flank pain.  She denies any recent IV drug abuse but reports smoking marijuana and drinking alcohol yesterday.  Temp 102.100F, WBC 15.4, Urine analysis showed nitrite positive, large hemoglobin, rare bacteria, RBC 11-20, WBC greater than 50.  Patient received 1 dose of IV Rocephin in the ED and TRH asked to admit.  Blood cultures 2/2 positive for e-coli.  08/18/19: Seen and examined.  Left flank pain improved after starting antibiotics.  Assessment/Plan: Principal Problem:   Acute pyelonephritis Active Problems:   Polysubstance abuse (HCC)   Depression   SIRS (systemic inflammatory response syndrome) (HCC)   Hypokalemia  Sepsis secondary to acute left pyelonephritis/E. coli bacteremia Presented with leukocytosis and fever with T-max of 103.1.   Positive UA for pyuria Evidence of pyelonephritis on CT scan Blood cultures x2+ for E. Coli Urine culture suggest recollection Currently on Rocephin empirically, continue Repeat blood cultures x2 peripherally on 08/19/2019. Monitor fever curve and WBC Maintain MAP greater than 65  Polysubstance abuse including alcohol and THC Polysubstance abuse cessation counseling Continue CIWA protocol  Resolved hypokalemia post repletion Potassium 4.1 from 3.2.   DVT prophylaxis: Lovenox subcu daily    Code Status: Full code. Health care proxy would be her mother  Patient/Family Communication:  Will call her mother if okay with the patient.   Consults called: None  Disposition: Patient is from home.  Anticipate discharge to home within the next 48 hours.  Barrier to discharge ongoing  treatment for E. coli bacteremia with IV antibiotics.  Repeated blood cultures will need to be negative.   Objective: Vitals:   08/18/19 0144 08/18/19 0525 08/18/19 0933 08/18/19 1332  BP: (!) 96/56 115/89 100/63 (!) 93/59  Pulse: (!) 56 86 78 67  Resp: 18 18 18 18   Temp: 98.4 F (36.9 C) (!) 100.7 F (38.2 C) (!) 100.5 F (38.1 C) 98.6 F (37 C)  TempSrc: Oral Oral Oral Oral  SpO2: 100% 100% 100% 98%  Weight:      Height:        Intake/Output Summary (Last 24 hours) at 08/18/2019 1613 Last data filed at 08/18/2019 1400 Gross per 24 hour  Intake 2700 ml  Output 1800 ml  Net 900 ml   Filed Weights   08/17/19 1950  Weight: 63.1 kg    Exam:  . General: 34 y.o. year-old female well developed well nourished in no acute distress.  Alert and oriented x3. . Cardiovascular: Regular rate and rhythm with no rubs or gallops.  No thyromegaly or JVD noted.   20 Respiratory: Clear to auscultation with no wheezes or rales. Good inspiratory effort. . Abdomen: Soft nontender nondistended with normal bowel sounds x4 quadrants.  Left flank tenderness on palpation. . Musculoskeletal: No lower extremity edema. 2/4 pulses in all 4 extremities. Marland Kitchen Psychiatry: Mood is appropriate for condition and setting   Data Reviewed: CBC: Recent Labs  Lab 08/17/19 1201 08/18/19 0527  WBC 15.4* 11.3*  NEUTROABS 12.8*  --   HGB 13.8 12.2  HCT 41.9 38.0  MCV 93.5 96.4  PLT 318 232   Basic Metabolic Panel: Recent Labs  Lab 08/17/19 1201 08/18/19 0527  NA 132* 137  K 3.2* 4.1  CL 98 107  CO2 22 22  GLUCOSE 134* 133*  BUN 6 7  CREATININE 0.80 0.93  CALCIUM 8.9 8.4*   GFR: Estimated Creatinine Clearance: 79.8 mL/min (by C-G formula based on SCr of 0.93 mg/dL). Liver Function Tests: Recent Labs  Lab 08/17/19 1201  AST 20  ALT 14  ALKPHOS 73  BILITOT 0.6  PROT 7.9  ALBUMIN 4.1   No results for input(s): LIPASE, AMYLASE in the last 168 hours. No results for input(s): AMMONIA in the  last 168 hours. Coagulation Profile: Recent Labs  Lab 08/17/19 1201  INR 1.0   Cardiac Enzymes: No results for input(s): CKTOTAL, CKMB, CKMBINDEX, TROPONINI in the last 168 hours. BNP (last 3 results) No results for input(s): PROBNP in the last 8760 hours. HbA1C: No results for input(s): HGBA1C in the last 72 hours. CBG: No results for input(s): GLUCAP in the last 168 hours. Lipid Profile: No results for input(s): CHOL, HDL, LDLCALC, TRIG, CHOLHDL, LDLDIRECT in the last 72 hours. Thyroid Function Tests: No results for input(s): TSH, T4TOTAL, FREET4, T3FREE, THYROIDAB in the last 72 hours. Anemia Panel: No results for input(s): VITAMINB12, FOLATE, FERRITIN, TIBC, IRON, RETICCTPCT in the last 72 hours. Urine analysis:    Component Value Date/Time   COLORURINE YELLOW 08/17/2019 1201   APPEARANCEUR CLEAR 08/17/2019 1201   LABSPEC 1.005 08/17/2019 1201   PHURINE 6.0 08/17/2019 1201   GLUCOSEU NEGATIVE 08/17/2019 1201   HGBUR LARGE (A) 08/17/2019 1201   BILIRUBINUR NEGATIVE 08/17/2019 1201   KETONESUR NEGATIVE 08/17/2019 1201   PROTEINUR NEGATIVE 08/17/2019 1201   UROBILINOGEN 0.2 03/11/2019 1615   NITRITE POSITIVE (A) 08/17/2019 1201   LEUKOCYTESUR MODERATE (A) 08/17/2019 1201   Sepsis Labs: @LABRCNTIP (procalcitonin:4,lacticidven:4)  ) Recent Results (from the past 240 hour(s))  Culture, blood (x 2)     Status: None (Preliminary result)   Collection Time: 08/17/19 12:01 PM   Specimen: BLOOD  Result Value Ref Range Status   Specimen Description BLOOD LEFT ANTECUBITAL  Final   Special Requests   Final    BOTTLES DRAWN AEROBIC AND ANAEROBIC Blood Culture results may not be optimal due to an excessive volume of blood received in culture bottles Performed at [facility], [place] [address] [place], [place] [address]    Culture  Setup Time   Final    GRAM NEGATIVE RODS IN BOTH AEROBIC AND ANAEROBIC BOTTLES CRITICAL RESULT CALLED TO, READ BACK BY AND  VERIFIED WITH: Damian Leavell [id number] 08/18/2019 Mena Goes Performed at [facility], [place] [address] [place], [place] [address]    Culture GRAM NEGATIVE RODS  Final   Report Status PENDING  Incomplete  Urine culture     Status: Abnormal   Collection Time: 08/17/19 12:01 PM   Specimen: Urine, Catheterized  Result Value Ref Range Status   Specimen Description   Final    URINE, CATHETERIZED Performed at [place] [address] [place], [place] [address]    Special Requests   Final    Normal Performed at [facility], [place] [address], [place], [place] [address]    Culture MULTIPLE SPECIES PRESENT, SUGGEST RECOLLECTION (A)  Final   Report Status 08/18/2019 FINAL  Final  Blood Culture ID Panel (Reflexed)     Status: Abnormal   Collection Time: 08/17/19 12:01 PM  Result Value Ref Range Status   Enterococcus species NOT DETECTED NOT DETECTED Final   Listeria monocytogenes NOT DETECTED NOT DETECTED Final   Staphylococcus species  NOT DETECTED NOT DETECTED Final   Staphylococcus aureus (BCID) NOT DETECTED NOT DETECTED Final   Streptococcus species NOT DETECTED NOT DETECTED Final   Streptococcus agalactiae NOT DETECTED NOT DETECTED Final   Streptococcus pneumoniae NOT DETECTED NOT DETECTED Final   Streptococcus pyogenes NOT DETECTED NOT DETECTED Final   Acinetobacter baumannii NOT DETECTED NOT DETECTED Final   Enterobacteriaceae species DETECTED (A) NOT DETECTED Final    Comment: Enterobacteriaceae represent a large family of gram-negative bacteria, not a single organism. CRITICAL RESULT CALLED TO, READ BACK BY AND VERIFIED WITH: J. GADHIA,PHARMD [id number] 08/18/2019 T. TYSOR    Enterobacter cloacae complex NOT DETECTED NOT DETECTED Final   Escherichia coli DETECTED (A) NOT DETECTED Final    Comment: CRITICAL RESULT CALLED TO, READ BACK BY AND VERIFIED WITH: J. Garth Schlatter [id number] 08/18/2019 T. TYSOR    Klebsiella oxytoca NOT DETECTED NOT  DETECTED Final   Klebsiella pneumoniae NOT DETECTED NOT DETECTED Final   Proteus species NOT DETECTED NOT DETECTED Final   Serratia marcescens NOT DETECTED NOT DETECTED Final   Carbapenem resistance NOT DETECTED NOT DETECTED Final   Haemophilus influenzae NOT DETECTED NOT DETECTED Final   Neisseria meningitidis NOT DETECTED NOT DETECTED Final   Pseudomonas aeruginosa NOT DETECTED NOT DETECTED Final   Candida albicans NOT DETECTED NOT DETECTED Final   Candida glabrata NOT DETECTED NOT DETECTED Final   Candida krusei NOT DETECTED NOT DETECTED Final   Candida parapsilosis NOT DETECTED NOT DETECTED Final   Candida tropicalis NOT DETECTED NOT DETECTED Final    Comment: Performed at [facility], [address], [place], [place] [address]  Culture, blood (x 2)     Status: None (Preliminary result)   Collection Time: 08/17/19 12:02 PM   Specimen: BLOOD  Result Value Ref Range Status   Specimen Description   Final    BLOOD RIGHT ANTECUBITAL Performed at [facility], [address] [place], [place] [address]    Special Requests   Final    BOTTLES DRAWN AEROBIC AND ANAEROBIC Blood Culture adequate volume Performed at [facility], [address], [place], [place] [address]    Culture  Setup Time   Final    GRAM NEGATIVE RODS AEROBIC BOTTLE ONLY CRITICAL RESULT CALLED TO, READ BACK BY AND VERIFIED WITH: Thana Ates 3810 08/18/2019 Girtha Hake Performed at [facility], [address] [place], [place] [address]    Culture GRAM NEGATIVE RODS  Final   Report Status PENDING  Incomplete  SARS CORONAVIRUS 2 (TAT 6-24 HRS) Nasopharyngeal Nasopharyngeal Swab     Status: None   Collection Time: 08/17/19  4:22 PM   Specimen: Nasopharyngeal Swab  Result Value Ref Range Status   SARS Coronavirus 2 NEGATIVE NEGATIVE Final    Comment: (NOTE) SARS-CoV-2 target nucleic acids are NOT DETECTED. The SARS-CoV-2 RNA is generally detectable in upper and  lower respiratory specimens during the acute phase of infection. Negative results do not preclude SARS-CoV-2 infection, do not rule out co-infections with other pathogens, and should not be used as the sole basis for treatment or other patient management decisions. Negative results must be combined with clinical observations, patient history, and epidemiological information. The expected result is Negative. Fact Sheet for Patients: HairSlick.no Fact Sheet for Healthcare Providers: quierodirigir.com This test is not yet approved or cleared by the [place] FDA and  has been authorized for detection and/or diagnosis of SARS-CoV-2 by FDA under an Emergency Use Authorization (EUA). This EUA will remain  in effect (meaning this test can be used) for the duration of the COVID-19 declaration under Section 56 4(b)(1) of the Act, 21 U.S.C. section 360bbb-3(b)(1), unless the authorization is terminated or revoked sooner. Performed at [facility], [address], [place], [place] [address]       Studies: CT ABDOMEN PELVIS W CONTRAST  Result Date: 08/17/2019 CLINICAL DATA:  Acute nonlocalized abdominal pain. EXAM: CT ABDOMEN AND PELVIS WITH CONTRAST TECHNIQUE: Multidetector CT imaging of the abdomen and pelvis was performed using the standard protocol following bolus administration of intravenous contrast. CONTRAST:  OMNIPAQUE IOHEXOL 300 MG/ML  SOLN COMPARISON:  CT 07/04/2017 FINDINGS: Lower chest: Accessory fissure at the right lung base. No acute findings. No focal airspace disease or pleural fluid. Hepatobiliary: There tiny scattered hepatic hypodensities that are too small to accurately characterize. Gallbladder physiologically distended, no calcified stone. No biliary dilatation. Pancreas: No ductal dilatation or inflammation. Spleen: Normal in size without focal abnormality. Adrenals/Urinary Tract: Normal adrenal glands.  Heterogeneous left renal enhancement. Ill-defined cortical hypodensity, series 2, image 23. mild left perinephric edema. No hydronephrosis. There is mild left ureteral enhancement. Homogeneous right renal enhancement. No focal right renal abnormality. Right ureter decompressed. Distended urinary bladder without wall thickening. Stomach/Bowel: Bowel evaluation is limited in the absence of enteric contrast and paucity of intra-abdominal fat. Stomach partially distended. Normal positioning of the ligament of Treitz. Central small bowel loops are fluid-filled but nondilated. There is no evidence of obstruction. No definite small bowel inflammation or wall thickening. Normal air-filled appendix in the right lower quadrant. Small volume of colonic stool. Sigmoid colon is redundant. No colonic wall thickening. Vascular/Lymphatic: Mild aortic atherosclerosis, age advanced. Portal vein is patent. Mesenteric vessels are grossly patent. Prominent adnexal and periuterine vascularity with dilatation of the left ovarian vein 6 mm with reflux of contrast. Few prominent left retroperitoneal nodes are nonspecific but likely reactive. Reproductive: Probable corpus luteal cyst in the right ovary. Uterus and left adnexa are unremarkable. There is prominent periuterine and adnexal vascularity. Other: Small amount of free fluid in the pelvis. No free air. Musculoskeletal: There are no acute or suspicious osseous abnormalities. IMPRESSION: 1. Findings consistent with left pyelonephritis. Ill-defined low-density in the left upper kidney concerning for phlegmon formation, no drainable fluid collection/abscess. 2. Prominent adnexal and periuterine vascularity with dilatation of the left ovarian vein, can be seen with pelvic congestion syndrome. 3. Few prominent fluid-filled loops of small bowel in the central abdomen, likely reactive. 4. Aortic atherosclerosis, mild but age advanced. Aortic Atherosclerosis (ICD10-I70.0). Aortic  Atherosclerosis (ICD10-I70.0). Electronically Signed   By: Narda Rutherford M.D.   On: 08/17/2019 17:42    Scheduled Meds: . enoxaparin (LOVENOX) injection  40 mg Subcutaneous Q24H    Continuous Infusions: . cefTRIAXone (ROCEPHIN)  IV 2 g (08/18/19 0616)  . lactated ringers 125 mL/hr at 08/18/19 1038     LOS: 1 day     Darlin Drop, MD [facility] Pager [phone]  If 7PM-7AM, please contact night-coverage [email or web address] Password [facility] 08/18/2019, 4:13 PM

## 2019-08-18 NOTE — Progress Notes (Signed)
PHARMACY - PHYSICIAN COMMUNICATION CRITICAL VALUE ALERT - BLOOD CULTURE IDENTIFICATION (BCID)  Tiffany [name] is an 34 y.o. female who presented to [facility] on 08/17/2019 with a chief complaint of flank pain.   Assessment: acute pyelonephritis with systemic inflammatory response syndrome  Name of physician (or Provider) Contacted: M. Katherina Right, NP  Current antibiotics: Ceftriaxone 1g IV q24h   Changes to prescribed antibiotics recommended:  Increase Ceftriaxone to 2g IV q24h Recommendations accepted by provider   Results for orders placed or performed during the hospital encounter of 08/17/19  Blood Culture ID Panel (Reflexed) (Collected: 08/17/2019 12:01 PM)  Result Value Ref Range   Enterococcus species NOT DETECTED NOT DETECTED   Listeria monocytogenes NOT DETECTED NOT DETECTED   Staphylococcus species NOT DETECTED NOT DETECTED   Staphylococcus aureus (BCID) NOT DETECTED NOT DETECTED   Streptococcus species NOT DETECTED NOT DETECTED   Streptococcus agalactiae NOT DETECTED NOT DETECTED   Streptococcus pneumoniae NOT DETECTED NOT DETECTED   Streptococcus pyogenes NOT DETECTED NOT DETECTED   Acinetobacter baumannii NOT DETECTED NOT DETECTED   Enterobacteriaceae species DETECTED (A) NOT DETECTED   Enterobacter cloacae complex NOT DETECTED NOT DETECTED   Escherichia coli DETECTED (A) NOT DETECTED   Klebsiella oxytoca NOT DETECTED NOT DETECTED   Klebsiella pneumoniae NOT DETECTED NOT DETECTED   Proteus species NOT DETECTED NOT DETECTED   Serratia marcescens NOT DETECTED NOT DETECTED   Carbapenem resistance NOT DETECTED NOT DETECTED   Haemophilus influenzae NOT DETECTED NOT DETECTED   Neisseria meningitidis NOT DETECTED NOT DETECTED   Pseudomonas aeruginosa NOT DETECTED NOT DETECTED   Candida albicans NOT DETECTED NOT DETECTED   Candida glabrata NOT DETECTED NOT DETECTED   Candida krusei NOT DETECTED NOT DETECTED   Candida parapsilosis NOT DETECTED NOT DETECTED   Candida  tropicalis NOT DETECTED NOT DETECTED    Jamse Mead 08/18/2019  5:18 AM

## 2019-08-19 DIAGNOSIS — N10 Acute tubulo-interstitial nephritis: Secondary | ICD-10-CM

## 2019-08-19 LAB — CBC WITH DIFFERENTIAL/PLATELET
Abs Immature Granulocytes: 0.03 10*3/uL (ref 0.00–0.07)
Basophils Absolute: 0 10*3/uL (ref 0.0–0.1)
Basophils Relative: 0 %
Eosinophils Absolute: 0.2 10*3/uL (ref 0.0–0.5)
Eosinophils Relative: 3 %
HCT: 32.1 % — ABNORMAL LOW (ref 36.0–46.0)
Hemoglobin: 10.4 g/dL — ABNORMAL LOW (ref 12.0–15.0)
Immature Granulocytes: 0 %
Lymphocytes Relative: 19 %
Lymphs Abs: 1.5 10*3/uL (ref 0.7–4.0)
MCH: 31.1 pg (ref 26.0–34.0)
MCHC: 32.4 g/dL (ref 30.0–36.0)
MCV: 96.1 fL (ref 80.0–100.0)
Monocytes Absolute: 1.1 10*3/uL — ABNORMAL HIGH (ref 0.1–1.0)
Monocytes Relative: 14 %
Neutro Abs: 5 10*3/uL (ref 1.7–7.7)
Neutrophils Relative %: 64 %
Platelets: 228 10*3/uL (ref 150–400)
RBC: 3.34 MIL/uL — ABNORMAL LOW (ref 3.87–5.11)
RDW: 15.6 % — ABNORMAL HIGH (ref 11.5–15.5)
WBC: 7.8 10*3/uL (ref 4.0–10.5)
nRBC: 0 % (ref 0.0–0.2)

## 2019-08-19 MED ORDER — LACTATED RINGERS IV SOLN: INTRAVENOUS | Status: AC

## 2019-08-19 NOTE — Progress Notes (Signed)
PROGRESS NOTE  Tiffany [name] YDX:[id number] DOB: 1985-10-12 DOA: 08/17/2019 PCP: Department, [facility]  HPI/Recap of past 24 hours:  Tiffany [name] is a 34 y.o. female with history h/o polysubstance abuse (marijuana, cocaine, alcohol), depression, PID, hypertension but not on any medications, prior UTIs presents with complaints of left flank pain.  She denies any recent IV drug abuse but reports smoking marijuana and drinking alcohol yesterday.  Temp 102.62F, WBC 15.4, Urine analysis showed nitrite positive, large hemoglobin, rare bacteria, RBC 11-20, WBC greater than 50.  Patient received 1 dose of IV Rocephin in the ED and TRH asked to admit.  Blood cultures 2/2 bottles positive for e-coli.  08/19/19: Seen and examined.  Left flank pain controlled with current pain management.  We will repeat her blood cultures x2 peripherally.    Assessment/Plan: Principal Problem:   Acute pyelonephritis Active Problems:   Polysubstance abuse (HCC)   Depression   SIRS (systemic inflammatory response syndrome) (HCC)   Hypokalemia  Sepsis, resolved, secondary to acute left pyelonephritis/E. coli bacteremia Presented with leukocytosis and fever with T-max of 103.1.   Positive UA for pyuria Evidence of pyelonephritis on CT scan with possible phlegmon formation, no drainage fluid collection or abscess. Blood cultures x2+ for E. Coli x2 bottles. Urine culture suggest recollection Currently on Rocephin empirically, continue day #2. Repeat blood cultures x2 peripherally on 08/19/2019. Continue monitor fever curve and WBC Continue to maintain MAP greater than 65 Leukocytosis and tachycardia have resolved T-max of 101.9 early this morning, will continue IV antibiotics for female Once no longer febrile will switch to oral antibiotic for DC planning  Polysubstance abuse including alcohol and THC Polysubstance abuse cessation counseling Continue CIWA protocol  Resolved hypokalemia post  repletion Potassium 4.1 from 3.2.   DVT prophylaxis: Lovenox subcu daily    Code Status: Full code. Health care proxy would be her mother  Patient/Family Communication:  Will call her mother if okay with the patient.   Consults called: None  Disposition: Patient is from home.  Anticipate discharge to home within the next 24 to 48 hours.  Barrier to discharge ongoing treatment for E. coli bacteremia with IV antibiotics.  Repeated blood cultures will need to be negative.   Objective: Vitals:   08/19/19 0443 08/19/19 0548 08/19/19 0620 08/19/19 1447  BP:   111/88 127/88  Pulse:   87 81  Resp:   18 18  Temp: (!) 101.9 F (38.8 C) (!) 100.9 F (38.3 C) 99.8 F (37.7 C) (!) 100.8 F (38.2 C)  TempSrc: Oral  Oral Oral  SpO2:   100% 100%  Weight:      Height:        Intake/Output Summary (Last 24 hours) at 08/19/2019 1449 Last data filed at 08/19/2019 1000 Gross per 24 hour  Intake 2839.32 ml  Output 2050 ml  Net 789.32 ml   Filed Weights   08/17/19 1950  Weight: 63.1 kg    Exam:  . General: 34 y.o. year-old female well-developed well-nourished no acute distress.  Alert and oriented x3.   . Cardiovascular: Regular rate and rhythm no rubs or gallops. Marland Kitchen Respiratory: Clear to auscultation with no wheezes or rales.   . Abdomen: Soft nontender normal bowel sounds present.  . Musculoskeletal: No lower extremity edema bilaterally. Marland Kitchen Psychiatry: Mood is appropriate for condition and setting.  Data Reviewed: CBC: Recent Labs  Lab 08/17/19 1201 08/18/19 0527 08/19/19 0712  WBC 15.4* 11.3* 7.8  NEUTROABS 12.8*  --  5.0  HGB 13.8 12.2 10.4*  HCT 41.9 38.0 32.1*  MCV 93.5 96.4 96.1  PLT 318 232 734   Basic Metabolic Panel: Recent Labs  Lab 08/17/19 1201 08/18/19 0527  NA 132* 137  K 3.2* 4.1  CL 98 107  CO2 22 22  GLUCOSE 134* 133*  BUN 6 7  CREATININE 0.80 0.93  CALCIUM 8.9 8.4*   GFR: Estimated Creatinine Clearance: 79.8 mL/min (by C-G formula based  on SCr of 0.93 mg/dL). Liver Function Tests: Recent Labs  Lab 08/17/19 1201  AST 20  ALT 14  ALKPHOS 73  BILITOT 0.6  PROT 7.9  ALBUMIN 4.1   No results for input(s): LIPASE, AMYLASE in the last 168 hours. No results for input(s): AMMONIA in the last 168 hours. Coagulation Profile: Recent Labs  Lab 08/17/19 1201  INR 1.0   Cardiac Enzymes: No results for input(s): CKTOTAL, CKMB, CKMBINDEX, TROPONINI in the last 168 hours. BNP (last 3 results) No results for input(s): PROBNP in the last 8760 hours. HbA1C: No results for input(s): HGBA1C in the last 72 hours. CBG: No results for input(s): GLUCAP in the last 168 hours. Lipid Profile: No results for input(s): CHOL, HDL, LDLCALC, TRIG, CHOLHDL, LDLDIRECT in the last 72 hours. Thyroid Function Tests: No results for input(s): TSH, T4TOTAL, FREET4, T3FREE, THYROIDAB in the last 72 hours. Anemia Panel: No results for input(s): VITAMINB12, FOLATE, FERRITIN, TIBC, IRON, RETICCTPCT in the last 72 hours. Urine analysis:    Component Value Date/Time   COLORURINE YELLOW 08/17/2019 1201   APPEARANCEUR CLEAR 08/17/2019 1201   LABSPEC 1.005 08/17/2019 1201   PHURINE 6.0 08/17/2019 1201   GLUCOSEU NEGATIVE 08/17/2019 1201   HGBUR LARGE (A) 08/17/2019 1201   BILIRUBINUR NEGATIVE 08/17/2019 1201   KETONESUR NEGATIVE 08/17/2019 1201   PROTEINUR NEGATIVE 08/17/2019 1201   UROBILINOGEN 0.2 03/11/2019 1615   NITRITE POSITIVE (A) 08/17/2019 1201   LEUKOCYTESUR MODERATE (A) 08/17/2019 1201   Sepsis Labs: @LABRCNTIP (procalcitonin:4,lacticidven:4)  ) Recent Results (from the past 240 hour(s))  Culture, blood (x 2)     Status: Abnormal (Preliminary result)   Collection Time: 08/17/19 12:01 PM   Specimen: BLOOD  Result Value Ref Range Status   Specimen Description BLOOD LEFT ANTECUBITAL  Final   Special Requests   Final    BOTTLES DRAWN AEROBIC AND ANAEROBIC Blood Culture results may not be optimal due to an excessive volume of blood  received in culture bottles Performed at [facility], [place] [address] [place], [place] [address]    Culture  Setup Time   Final    GRAM NEGATIVE RODS IN BOTH AEROBIC AND ANAEROBIC BOTTLES CRITICAL RESULT CALLED TO, READ BACK BY AND VERIFIED WITH: Damian Leavell 0240 08/18/2019 T. TYSOR    Culture (A)  Final    ESCHERICHIA COLI SUSCEPTIBILITIES TO FOLLOW Performed at [facility], [place] [address] [place], [place] [address]    Report Status PENDING  Incomplete  Urine culture     Status: Abnormal   Collection Time: 08/17/19 12:01 PM   Specimen: Urine, Catheterized  Result Value Ref Range Status   Specimen Description   Final    URINE, CATHETERIZED Performed at [place] [address] [place], [place] [address]    Special Requests   Final    Normal Performed at [facility], [place] [address] [place], [place] [address]    Culture MULTIPLE SPECIES PRESENT, SUGGEST RECOLLECTION (A)  Final   Report Status 08/18/2019  FINAL  Final  Blood Culture ID Panel (Reflexed)     Status: Abnormal   Collection Time: 08/17/19 12:01 PM  Result Value Ref Range Status   Enterococcus species NOT DETECTED NOT DETECTED Final   Listeria monocytogenes NOT DETECTED NOT DETECTED Final   Staphylococcus species NOT DETECTED NOT DETECTED Final   Staphylococcus aureus (BCID) NOT DETECTED NOT DETECTED Final   Streptococcus species NOT DETECTED NOT DETECTED Final   Streptococcus agalactiae NOT DETECTED NOT DETECTED Final   Streptococcus pneumoniae NOT DETECTED NOT DETECTED Final   Streptococcus pyogenes NOT DETECTED NOT DETECTED Final   Acinetobacter baumannii NOT DETECTED NOT DETECTED Final   Enterobacteriaceae species DETECTED (A) NOT DETECTED Final    Comment: Enterobacteriaceae represent a large family of gram-negative bacteria, not a single organism. CRITICAL RESULT CALLED TO, READ BACK BY AND VERIFIED WITH: J. GADHIA,PHARMD [id number]  08/18/2019 T. TYSOR    Enterobacter cloacae complex NOT DETECTED NOT DETECTED Final   Escherichia coli DETECTED (A) NOT DETECTED Final    Comment: CRITICAL RESULT CALLED TO, READ BACK BY AND VERIFIED WITH: J. Garth Schlatter [phone] 08/18/2019 T. TYSOR    Klebsiella oxytoca NOT DETECTED NOT DETECTED Final   Klebsiella pneumoniae NOT DETECTED NOT DETECTED Final   Proteus species NOT DETECTED NOT DETECTED Final   Serratia marcescens NOT DETECTED NOT DETECTED Final   Carbapenem resistance NOT DETECTED NOT DETECTED Final   Haemophilus influenzae NOT DETECTED NOT DETECTED Final   Neisseria meningitidis NOT DETECTED NOT DETECTED Final   Pseudomonas aeruginosa NOT DETECTED NOT DETECTED Final   Candida albicans NOT DETECTED NOT DETECTED Final   Candida glabrata NOT DETECTED NOT DETECTED Final   Candida krusei NOT DETECTED NOT DETECTED Final   Candida parapsilosis NOT DETECTED NOT DETECTED Final   Candida tropicalis NOT DETECTED NOT DETECTED Final    Comment: Performed at [facility], [address], [place], [place] [address]  Culture, blood (x 2)     Status: None (Preliminary result)   Collection Time: 08/17/19 12:02 PM   Specimen: BLOOD  Result Value Ref Range Status   Specimen Description   Final    BLOOD RIGHT ANTECUBITAL Performed at [facility], [address], [place], [place] [address]    Special Requests   Final    BOTTLES DRAWN AEROBIC AND ANAEROBIC Blood Culture adequate volume Performed at [facility], [address], [place], [place] [address]    Culture  Setup Time   Final    GRAM NEGATIVE RODS IN BOTH AEROBIC AND ANAEROBIC BOTTLES CRITICAL RESULT CALLED TO, READ BACK BY AND VERIFIED WITH: Thana Ates [id number] 08/18/2019 T. TYSOR    Culture   Final    GRAM NEGATIVE RODS IDENTIFICATION TO FOLLOW Performed at [facility], [address] [place], [place] [address]    Report Status PENDING  Incomplete  SARS CORONAVIRUS 2 (TAT 6-24  HRS) Nasopharyngeal Nasopharyngeal Swab     Status: None   Collection Time: 08/17/19  4:22 PM   Specimen: Nasopharyngeal Swab  Result Value Ref Range Status   SARS Coronavirus 2 NEGATIVE NEGATIVE Final    Comment: (NOTE) SARS-CoV-2 target nucleic acids are NOT DETECTED. The SARS-CoV-2 RNA is generally detectable in upper and lower respiratory specimens during the acute phase of infection. Negative results do not preclude SARS-CoV-2 infection, do not rule out co-infections with other pathogens, and should not be used as the sole basis for treatment or other patient management decisions. Negative results must be  combined with clinical observations, patient history, and epidemiological information. The expected result is Negative. Fact Sheet for Patients: HairSlick.no Fact Sheet for Healthcare Providers: quierodirigir.com This test is not yet approved or cleared by the [place] FDA and  has been authorized for detection and/or diagnosis of SARS-CoV-2 by FDA under an Emergency Use Authorization (EUA). This EUA will remain  in effect (meaning this test can be used) for the duration of the COVID-19 declaration under Section 56 4(b)(1) of the Act, 21 U.S.C. section 360bbb-3(b)(1), unless the authorization is terminated or revoked sooner. Performed at [facility], [address] [place], [place] [address]       Studies: No results found.  Scheduled Meds: . enoxaparin (LOVENOX) injection  40 mg Subcutaneous Q24H    Continuous Infusions: . cefTRIAXone (ROCEPHIN)  IV 2 g (08/19/19 0547)  . lactated ringers 75 mL/hr at 08/19/19 1420     LOS: 2 days     Darlin Drop, MD [facility] Pager [phone]  If 7PM-7AM, please contact night-coverage [email or web address] Password [facility] 08/19/2019, 2:49 PM

## 2019-08-19 NOTE — Progress Notes (Signed)
Called the patient's mother Ms. Joselyne to give updates.  Obtained consent from the patient on 08/18/19.  No answer.  Voicemail box is full, unable to leave a message.  Will call again.

## 2019-08-19 NOTE — Progress Notes (Signed)
Patient MEWS score is 2 because her temp is 101.9 F which makes her be in the yellow. She has been having fevers during this hospital stay. Yellow MEWS protocol was initiated. PRN Tylenol 650 mg PO was given at 0444.

## 2019-08-20 LAB — COMPREHENSIVE METABOLIC PANEL
ALT: 44 U/L (ref 0–44)
AST: 36 U/L (ref 15–41)
Albumin: 2.7 g/dL — ABNORMAL LOW (ref 3.5–5.0)
Alkaline Phosphatase: 64 U/L (ref 38–126)
Anion gap: 6 (ref 5–15)
BUN: 10 mg/dL (ref 6–20)
CO2: 25 mmol/L (ref 22–32)
Calcium: 8.4 mg/dL — ABNORMAL LOW (ref 8.9–10.3)
Chloride: 108 mmol/L (ref 98–111)
Creatinine, Ser: 0.75 mg/dL (ref 0.44–1.00)
GFR calc Af Amer: >60 mL/min (ref >60)
GFR calc non Af Amer: >60 mL/min (ref >60)
Glucose, Bld: 98 mg/dL (ref 70–99)
Potassium: 4 mmol/L (ref 3.5–5.1)
Sodium: 139 mmol/L (ref 135–145)
Total Bilirubin: 0.5 mg/dL (ref 0.3–1.2)
Total Protein: 6.1 g/dL — ABNORMAL LOW (ref 6.5–8.1)

## 2019-08-20 LAB — CULTURE, BLOOD (ROUTINE X 2): Special Requests: ADEQUATE

## 2019-08-20 LAB — CBC WITH DIFFERENTIAL/PLATELET
Abs Immature Granulocytes: 0.02 10*3/uL (ref 0.00–0.07)
Basophils Absolute: 0 10*3/uL (ref 0.0–0.1)
Basophils Relative: 1 %
Eosinophils Absolute: 0.2 10*3/uL (ref 0.0–0.5)
Eosinophils Relative: 3 %
HCT: 33.6 % — ABNORMAL LOW (ref 36.0–46.0)
Hemoglobin: 10.9 g/dL — ABNORMAL LOW (ref 12.0–15.0)
Immature Granulocytes: 0 %
Lymphocytes Relative: 29 %
Lymphs Abs: 1.8 10*3/uL (ref 0.7–4.0)
MCH: 30.8 pg (ref 26.0–34.0)
MCHC: 32.4 g/dL (ref 30.0–36.0)
MCV: 94.9 fL (ref 80.0–100.0)
Monocytes Absolute: 1 10*3/uL (ref 0.1–1.0)
Monocytes Relative: 16 %
Neutro Abs: 3.2 10*3/uL (ref 1.7–7.7)
Neutrophils Relative %: 51 %
Platelets: 258 10*3/uL (ref 150–400)
RBC: 3.54 MIL/uL — ABNORMAL LOW (ref 3.87–5.11)
RDW: 15.5 % (ref 11.5–15.5)
WBC: 6.1 10*3/uL (ref 4.0–10.5)
nRBC: 0 % (ref 0.0–0.2)

## 2019-08-20 LAB — MAGNESIUM: Magnesium: 2 mg/dL (ref 1.7–2.4)

## 2019-08-20 MED ORDER — BISACODYL 10 MG RE SUPP
10.0000 mg | Freq: Once | RECTAL | Status: AC
  Administered 2019-08-20: 10 mg via RECTAL
  Filled 2019-08-20: qty 1

## 2019-08-20 MED ORDER — AMOXICILLIN-POT CLAVULANATE 875-125 MG PO TABS: 1.0000 | ORAL_TABLET | Freq: Two times a day (BID) | ORAL | Status: DC

## 2019-08-20 MED ORDER — TRAZODONE HCL 50 MG PO TABS
50.0000 mg | ORAL_TABLET | Freq: Every evening | ORAL | Status: DC | PRN
  Administered 2019-08-20: 50 mg via ORAL
  Filled 2019-08-20: qty 1

## 2019-08-20 MED ORDER — SULFAMETHOXAZOLE-TRIMETHOPRIM 800-160 MG PO TABS
1.0000 | ORAL_TABLET | Freq: Two times a day (BID) | ORAL | Status: DC
  Administered 2019-08-21: 1 via ORAL
  Filled 2019-08-20 (×2): qty 1

## 2019-08-20 MED ORDER — POLYETHYLENE GLYCOL 3350 17 G PO PACK
17.0000 g | PACK | Freq: Two times a day (BID) | ORAL | Status: DC
  Administered 2019-08-20 (×2): 17 g via ORAL
  Filled 2019-08-20 (×2): qty 1

## 2019-08-20 MED ORDER — SENNOSIDES-DOCUSATE SODIUM 8.6-50 MG PO TABS
2.0000 | ORAL_TABLET | Freq: Two times a day (BID) | ORAL | Status: DC
  Administered 2019-08-20 (×2): 2 via ORAL
  Filled 2019-08-20 (×2): qty 2

## 2019-08-20 NOTE — Progress Notes (Signed)
PROGRESS NOTE  KYRAN WHITTIER IEP:[id number] DOB: 1986-01-15 DOA: 08/17/2019 PCP: Department, [facility]  HPI/Recap of past 24 hours:  Tiffany [name] is a 34 y.o. female with history h/o polysubstance abuse (marijuana, cocaine, alcohol), depression, PID, hypertension but not on any medications, prior UTIs presents with complaints of left flank pain.  She denies any recent IV drug abuse but reports smoking marijuana and drinking alcohol yesterday.  Temp 102.50F, WBC 15.4, Urine analysis showed nitrite positive, large hemoglobin, rare bacteria, RBC 11-20, WBC greater than 50.  Patient received 1 dose of IV Rocephin in the ED and TRH asked to admit.  Blood cultures 2/2 bottles positive for e-coli.  08/20/19: Febrile overnight, Tmax 101.1.  C/w IV abx.  Ecoli pansensitive.  Will switch to bactrim in the AM.    Assessment/Plan: Principal Problem:   Acute pyelonephritis Active Problems:   Polysubstance abuse (HCC)   Depression   SIRS (systemic inflammatory response syndrome) (HCC)   Hypokalemia  Sepsis, resolved, secondary to acute left pyelonephritis/E. coli bacteremia Presented with leukocytosis and fever with T-max of 103.1.   Positive UA for pyuria Evidence of pyelonephritis on CT scan with possible phlegmon formation, no drainage fluid collection or abscess. Blood cultures x2+ for E. Coli x2 bottles, pansensitive. Urine culture suggest recollection Currently on Rocephin empirically, continue day #3. Repeat blood cultures x2 peripherally on 08/19/2019 negative to date Fever overnight with T-max 101.1: Continue IV antibiotics for now, will switch to Bactrim tomorrow Leukocytosis and neutrophilia have resolved Continue to monitor fever curve  Polysubstance abuse including alcohol and THC Polysubstance abuse cessation counseling Continue CIWA protocol  Resolved hypokalemia post repletion Potassium 4.0 magnesium 2.0.   DVT prophylaxis: Lovenox subcu  daily    Code Status: Full code. Health care proxy would be her mother  Patient/Family Communication:  Will call her mother if okay with the patient.   Consults called: None  Disposition: Patient is from home.  Anticipate discharge to home within the next 24 to 48 hours.  Barrier to discharge ongoing treatment for E. coli bacteremia with IV antibiotics.  Repeated blood cultures will need to be negative.   Objective: Vitals:   08/19/19 2126 08/20/19 0652 08/20/19 0709 08/20/19 1344  BP: 108/75 125/87  124/77  Pulse: 77 77  67  Resp: 18 16  18   Temp: 99.1 F (37.3 C) (!) 101.1 F (38.4 C) (!) 100.8 F (38.2 C) 98.6 F (37 C)  TempSrc: Oral Oral Oral Oral  SpO2: 100%   100%  Weight:      Height:        Intake/Output Summary (Last 24 hours) at 08/20/2019 1509 Last data filed at 08/20/2019 1345 Gross per 24 hour  Intake 3165.69 ml  Output 0 ml  Net 3165.69 ml   Filed Weights   08/17/19 1950  Weight: 63.1 kg    Exam:  . General: 34 y.o. year-old female well-developed well-nourished no acute distress.  Alert and oriented x3.   . Cardiovascular: Regular rate and rhythm no rubs or gallops.   20 Respiratory: Clear to auscultation no wheezes no rales.   . Abdomen: Soft nontender normal bowel sounds present.  . Musculoskeletal: No lower extremity edema bilaterally.   Marland Kitchen Psychiatry: Mood is appropriate for condition and setting.   Data Reviewed: CBC: Recent Labs  Lab 08/17/19 1201 08/18/19 0527 08/19/19 0712 08/20/19 0455  WBC 15.4* 11.3* 7.8 6.1  NEUTROABS 12.8*  --  5.0 3.2  HGB 13.8 12.2 10.4* 10.9*  HCT 41.9 38.0 32.1* 33.6*  MCV 93.5 96.4 96.1 94.9  PLT 318 232 228 258   Basic Metabolic Panel: Recent Labs  Lab 08/17/19 1201 08/18/19 0527 08/20/19 0455  NA 132* 137 139  K 3.2* 4.1 4.0  CL 98 107 108  CO2 22 22 25   GLUCOSE 134* 133* 98  BUN 6 7 10   CREATININE 0.80 0.93 0.75  CALCIUM 8.9 8.4* 8.4*  MG  --   --  2.0   GFR: Estimated Creatinine  Clearance: 92.8 mL/min (by C-G formula based on SCr of 0.75 mg/dL). Liver Function Tests: Recent Labs  Lab 08/17/19 1201 08/20/19 0455  AST 20 36  ALT 14 44  ALKPHOS 73 64  BILITOT 0.6 0.5  PROT 7.9 6.1*  ALBUMIN 4.1 2.7*   No results for input(s): LIPASE, AMYLASE in the last 168 hours. No results for input(s): AMMONIA in the last 168 hours. Coagulation Profile: Recent Labs  Lab 08/17/19 1201  INR 1.0   Cardiac Enzymes: No results for input(s): CKTOTAL, CKMB, CKMBINDEX, TROPONINI in the last 168 hours. BNP (last 3 results) No results for input(s): PROBNP in the last 8760 hours. HbA1C: No results for input(s): HGBA1C in the last 72 hours. CBG: No results for input(s): GLUCAP in the last 168 hours. Lipid Profile: No results for input(s): CHOL, HDL, LDLCALC, TRIG, CHOLHDL, LDLDIRECT in the last 72 hours. Thyroid Function Tests: No results for input(s): TSH, T4TOTAL, FREET4, T3FREE, THYROIDAB in the last 72 hours. Anemia Panel: No results for input(s): VITAMINB12, FOLATE, FERRITIN, TIBC, IRON, RETICCTPCT in the last 72 hours. Urine analysis:    Component Value Date/Time   COLORURINE YELLOW 08/17/2019 1201   APPEARANCEUR CLEAR 08/17/2019 1201   LABSPEC 1.005 08/17/2019 1201   PHURINE 6.0 08/17/2019 1201   GLUCOSEU NEGATIVE 08/17/2019 1201   HGBUR LARGE (A) 08/17/2019 1201   BILIRUBINUR NEGATIVE 08/17/2019 1201   KETONESUR NEGATIVE 08/17/2019 1201   PROTEINUR NEGATIVE 08/17/2019 1201   UROBILINOGEN 0.2 03/11/2019 1615   NITRITE POSITIVE (A) 08/17/2019 1201   LEUKOCYTESUR MODERATE (A) 08/17/2019 1201   Sepsis Labs: @LABRCNTIP (procalcitonin:4,lacticidven:4)  ) Recent Results (from the past 240 hour(s))  Culture, blood (x 2)     Status: Abnormal   Collection Time: 08/17/19 12:01 PM   Specimen: BLOOD  Result Value Ref Range Status   Specimen Description BLOOD LEFT ANTECUBITAL  Final   Special Requests   Final    BOTTLES DRAWN AEROBIC AND ANAEROBIC Blood Culture  results may not be optimal due to an excessive volume of blood received in culture bottles Performed at [facility], [address], [place], [place]    Culture  Setup Time   Final    GRAM NEGATIVE RODS IN BOTH AEROBIC AND ANAEROBIC BOTTLES CRITICAL RESULT CALLED TO, READ BACK BY AND VERIFIED WITH: [place] 08/18/2019 Thana Ates Performed at [facility], [address], [place], [facility] [address]    Culture ESCHERICHIA COLI (A)  Final   Report Status 08/20/2019 FINAL  Final   Organism ID, Bacteria ESCHERICHIA COLI  Final      Susceptibility   Escherichia coli - MIC*    AMPICILLIN <=2 SENSITIVE Sensitive     CEFAZOLIN <=4 SENSITIVE Sensitive     CEFEPIME <=0.12 SENSITIVE Sensitive     CEFTAZIDIME <=1 SENSITIVE Sensitive     CEFTRIAXONE <=0.25 SENSITIVE Sensitive     CIPROFLOXACIN <=0.25 SENSITIVE Sensitive     GENTAMICIN <=1 SENSITIVE Sensitive  IMIPENEM <=0.25 SENSITIVE Sensitive     TRIMETH/SULFA <=20 SENSITIVE Sensitive     AMPICILLIN/SULBACTAM <=2 SENSITIVE Sensitive     PIP/TAZO <=4 SENSITIVE Sensitive     * ESCHERICHIA COLI  Urine culture     Status: Abnormal   Collection Time: 08/17/19 12:01 PM   Specimen: Urine, Catheterized  Result Value Ref Range Status   Specimen Description   Final    URINE, CATHETERIZED Performed at [facility], [address], [place], [place] [address]    Special Requests   Final    Normal Performed at [facility], [address] [place], [place] [address]    Culture MULTIPLE SPECIES PRESENT, SUGGEST RECOLLECTION (A)  Final   Report Status 08/18/2019 FINAL  Final  Blood Culture ID Panel (Reflexed)     Status: Abnormal   Collection Time: 08/17/19 12:01 PM  Result Value Ref Range Status   Enterococcus species NOT DETECTED NOT DETECTED Final   Listeria monocytogenes NOT DETECTED NOT DETECTED Final   Staphylococcus species NOT DETECTED NOT DETECTED Final    Staphylococcus aureus (BCID) NOT DETECTED NOT DETECTED Final   Streptococcus species NOT DETECTED NOT DETECTED Final   Streptococcus agalactiae NOT DETECTED NOT DETECTED Final   Streptococcus pneumoniae NOT DETECTED NOT DETECTED Final   Streptococcus pyogenes NOT DETECTED NOT DETECTED Final   Acinetobacter baumannii NOT DETECTED NOT DETECTED Final   Enterobacteriaceae species DETECTED (A) NOT DETECTED Final    Comment: Enterobacteriaceae represent a large family of gram-negative bacteria, not a single organism. CRITICAL RESULT CALLED TO, READ BACK BY AND VERIFIED WITH: J. GADHIA,PHARMD [id number] 08/18/2019 T. TYSOR    Enterobacter cloacae complex NOT DETECTED NOT DETECTED Final   Escherichia coli DETECTED (A) NOT DETECTED Final    Comment: CRITICAL RESULT CALLED TO, READ BACK BY AND VERIFIED WITH: J. Garth Schlatter [phone] 08/18/2019 T. TYSOR    Klebsiella oxytoca NOT DETECTED NOT DETECTED Final   Klebsiella pneumoniae NOT DETECTED NOT DETECTED Final   Proteus species NOT DETECTED NOT DETECTED Final   Serratia marcescens NOT DETECTED NOT DETECTED Final   Carbapenem resistance NOT DETECTED NOT DETECTED Final   Haemophilus influenzae NOT DETECTED NOT DETECTED Final   Neisseria meningitidis NOT DETECTED NOT DETECTED Final   Pseudomonas aeruginosa NOT DETECTED NOT DETECTED Final   Candida albicans NOT DETECTED NOT DETECTED Final   Candida glabrata NOT DETECTED NOT DETECTED Final   Candida krusei NOT DETECTED NOT DETECTED Final   Candida parapsilosis NOT DETECTED NOT DETECTED Final   Candida tropicalis NOT DETECTED NOT DETECTED Final    Comment: Performed at [facility], [address] [place], [place] [address]  Culture, blood (x 2)     Status: Abnormal   Collection Time: 08/17/19 12:02 PM   Specimen: BLOOD  Result Value Ref Range Status   Specimen Description   Final    BLOOD RIGHT ANTECUBITAL Performed at [facility], [address], [place], [place] [address]    Special  Requests   Final    BOTTLES DRAWN AEROBIC AND ANAEROBIC Blood Culture adequate volume Performed at [facility], [address], [place], [place] [address]    Culture  Setup Time   Final    GRAM NEGATIVE RODS IN BOTH AEROBIC AND ANAEROBIC BOTTLES CRITICAL RESULT CALLED TO, READ BACK BY AND VERIFIED WITH: J. Garth Schlatter [id number] 08/18/2019 T. TYSOR    Culture (A)  Final    ESCHERICHIA COLI SUSCEPTIBILITIES PERFORMED ON PREVIOUS CULTURE WITHIN  THE LAST 5 DAYS. Performed at [facility], [place] [address], [place], [place] [address]    Report Status 08/20/2019 FINAL  Final  SARS CORONAVIRUS 2 (TAT 6-24 HRS) Nasopharyngeal Nasopharyngeal Swab     Status: None   Collection Time: 08/17/19  4:22 PM   Specimen: Nasopharyngeal Swab  Result Value Ref Range Status   SARS Coronavirus 2 NEGATIVE NEGATIVE Final    Comment: (NOTE) SARS-CoV-2 target nucleic acids are NOT DETECTED. The SARS-CoV-2 RNA is generally detectable in upper and lower respiratory specimens during the acute phase of infection. Negative results do not preclude SARS-CoV-2 infection, do not rule out co-infections with other pathogens, and should not be used as the sole basis for treatment or other patient management decisions. Negative results must be combined with clinical observations, patient history, and epidemiological information. The expected result is Negative. Fact Sheet for Patients: SugarRoll.be Fact Sheet for Healthcare Providers: https://www.woods-mathews.com/ This test is not yet approved or cleared by the [place] FDA and  has been authorized for detection and/or diagnosis of SARS-CoV-2 by FDA under an Emergency Use Authorization (EUA). This EUA will remain  in effect (meaning this test can be used) for the duration of the COVID-19 declaration under Section 56 4(b)(1) of the Act, 21 U.S.C. section 360bbb-3(b)(1), unless the authorization is  terminated or revoked sooner. Performed at [facility], [place] [address], [place], [place] [address]   Culture, blood (routine x 2)     Status: None (Preliminary result)   Collection Time: 08/19/19  3:22 PM   Specimen: BLOOD RIGHT HAND  Result Value Ref Range Status   Specimen Description   Final    BLOOD RIGHT HAND BOTTLES DRAWN AEROBIC AND ANAEROBIC   Special Requests Blood Culture adequate volume  Final   Culture   Final    NO GROWTH < 12 HOURS Performed at [facility], [place] [address] [place], [place] [address]    Report Status PENDING  Incomplete  Culture, blood (routine x 2)     Status: None (Preliminary result)   Collection Time: 08/19/19  3:29 PM   Specimen: BLOOD LEFT HAND  Result Value Ref Range Status   Specimen Description   Final    BLOOD LEFT HAND BOTTLES DRAWN AEROBIC AND ANAEROBIC Blood Culture adequate volume Performed at [place] [address] [place], [place] [address]    Special Requests   Final    Normal Performed at [facility], [place] [address], [place], [place] [address]    Culture   Final    NO GROWTH < 12 HOURS Performed at [place] [address], [place],  [address]    Report Status PENDING  Incomplete      Studies: No results found.  Scheduled Meds: . enoxaparin (LOVENOX) injection  40 mg Subcutaneous Q24H  . polyethylene glycol  17 g Oral BID  . senna-docusate  2 tablet Oral BID  . [START ON 08/21/2019] sulfamethoxazole-trimethoprim  1 tablet Oral Q12H    Continuous Infusions:    LOS: 3 days     Kayleen Memos, MD [facility] Pager [phone]  If 7PM-7AM, please contact night-coverage [email or web address] Password [facility] 08/20/2019, 3:09 PM

## 2019-08-21 MED ORDER — SULFAMETHOXAZOLE-TRIMETHOPRIM 800-160 MG PO TABS: 1.0000 | ORAL_TABLET | Freq: Two times a day (BID) | ORAL | 0 refills | Status: AC

## 2019-08-21 MED ORDER — OXYCODONE HCL 5 MG PO TABS: 5.0000 mg | ORAL_TABLET | Freq: Every day | ORAL | 0 refills | Status: AC | PRN

## 2019-08-21 MED ORDER — OXYCODONE HCL 5 MG PO TABS: 5.0000 mg | ORAL_TABLET | Freq: Every day | ORAL | Status: DC | PRN

## 2019-08-21 MED ORDER — POLYETHYLENE GLYCOL 3350 17 G PO PACK: 17.0000 g | PACK | Freq: Every day | ORAL | 0 refills | Status: DC | PRN

## 2019-08-21 MED ORDER — SACCHAROMYCES BOULARDII 250 MG PO CAPS: 250.0000 mg | ORAL_CAPSULE | Freq: Two times a day (BID) | ORAL | 0 refills | Status: AC

## 2019-08-21 NOTE — Discharge Instructions (Signed)
Pyelonephritis, Adult  Pyelonephritis is an infection that occurs in the kidney. The kidneys are organs that help clean the blood by moving waste out of the blood and into the pee (urine). This infection can happen quickly, or it can last for a long time. In most cases, it clears up with treatment and does not cause other problems. What are the causes? This condition may be caused by:  Germs (bacteria) going from the bladder up to the kidney. This may happen after having a bladder infection.  Germs going from the blood to the kidney. What increases the risk? This condition is more likely to develop in:  Pregnant women.  Older people.  People who have any of these conditions: ? Diabetes. ? Inflammation of the prostate gland (prostatitis), in males. ? Kidney stones or bladder stones. ? Other problems with the kidney or the parts of your body that carry pee from the kidneys to the bladder (ureters). ? Cancer.  People who have a small, thin tube (catheter) placed in the bladder.  People who are sexually active.  Women who use a medicine that kills sperm (spermicide) to prevent pregnancy.  People who have had a prior urinary tract infection (UTI). What are the signs or symptoms? Symptoms of this condition include:  Peeing often.  A strong urge to pee right away.  Burning or stinging when peeing.  Belly pain.  Back pain.  Pain in the side (flank area).  Fever or chills.  Blood in the pee, or dark pee.  Feeling sick to your stomach (nauseous) or throwing up (vomiting). How is this treated? This condition may be treated by:  Taking antibiotic medicines by mouth (orally).  Drinking enough fluids. If the infection is bad, you may need to stay in the hospital. You may be given antibiotics and fluids that are put directly into a vein through an IV tube. In some cases, other treatments may be needed. Follow these instructions at home: Medicines  Take your antibiotic  medicine as told by your doctor. Do not stop taking the antibiotic even if you start to feel better.  Take over-the-counter and prescription medicines only as told by your doctor. General instructions   Drink enough fluid to keep your pee pale yellow.  Avoid caffeine, tea, and carbonated drinks.  Pee (urinate) often. Avoid holding in pee for long periods of time.  Pee before and after sex.  After pooping (having a bowel movement), women should wipe from front to back. Use each tissue only once.  Keep all follow-up visits as told by your doctor. This is important. Contact a doctor if:  You do not feel better after 2 days.  Your symptoms get worse.  You have a fever. Get help right away if:  You cannot take your medicine or drink fluids as told.  You have chills and shaking.  You throw up.  You have very bad pain in your side or back.  You feel very weak or you pass out (faint). Summary  Pyelonephritis is an infection that occurs in the kidney.  In most cases, this infection clears up with treatment and does not cause other problems.  Take your antibiotic medicine as told by your doctor. Do not stop taking the antibiotic even if you start to feel better.  Drink enough fluid to keep your pee pale yellow. This information is not intended to replace advice given to you by your health care provider. Make sure you discuss any questions you have with  your health care provider. Document Revised: 03/25/2018 Document Reviewed: 03/25/2018 Elsevier Patient Education  2020 [employer]  E. Coli Infection E. coli (Escherichia coli) are bacteria that can cause an infection in different parts of your body, including your intestines. E. coli bacteria normally live in the intestines of people and animals. Most types of E. coli do not cause infections, but some produce a poison (toxin) that can cause diarrhea. Depending on the toxin, this can cause mild or severe diarrhea. This  condition is contagious. This means that it can spread from person to person. It can also spread from animals to humans. Most cases of E. coli infection come from cows (cattle). In some cases, this infection can cause a dangerous complication called hemolytic uremic syndrome (HUS), which leads to blood cell abnormalities and kidney failure. What are the causes? This condition is caused by a bacteria. You may get this bacteria by:  Eating raw or undercooked beef from infected cattle.  Touching an infected animal and then touching your mouth.  Eating raw fruits or vegetables that have come in contact with the feces of infected animals.  Drinking fluids that have been contaminated with E. coli from infected animals.  Coming into contact with a surface that has been contaminated by an infected person. What increases the risk? This condition is more likely to develop in people who:  Are young children or older adults.  Eat raw or undercooked beef.  Drink raw (unpasteurized) milk, cider, or juice.  Eat soft cheeses made from unpasteurized milk.  Eat raw vegetables such as spinach or lettuce.  Are in close contact with cattle, goats, or sheep.  Having a weak body defense system (immune system). What are the signs or symptoms? Symptoms of this condition usually start 3-4 days after the bacteria are swallowed (ingested). Symptoms include:  Severe cramps and tenderness in the abdomen.  Diarrhea. This may be watery or bloody.  Nausea and vomiting.  Dehydration. This can cause fatigue, thirst, a dry mouth, and less frequent urination.  Low fever. This is not common. How is this diagnosed? This condition may be diagnosed based on:  A medical history.  A physical exam.  A stool culture. This involves testing a sample of stool for E. coli or toxins of E. coli. How is this treated? Treatment for this condition includes rest and fluids (supportive care). If you have severe diarrhea,  you may need to receive fluids through an IV. Symptoms of E. coli intestinal infection usually go away in 5-10 days. Some strains of E. coli may be treated with antibiotic or antidiarrheal medicines. However, these medicines are rarely given because they increase your risk for HUS. Follow these instructions at home: Eating and drinking      Drink enough fluids to keep your urine pale yellow. You may need to drink small amounts of clear liquids frequently.  Take an oral rehydration solution (ORS) as told by your health care provider. This drink is sold at pharmacies and retail stores.  Drink clear fluids, such as water, ice chips, diluted fruit juice, and low-calorie sports drinks.  Eat bland, easy-to-digest foods in small amounts as you are able. These foods include bananas, applesauce, rice, lean meats, toast, and crackers.  Eat small, frequent meals rather than large meals.  Do not drink milk, caffeine, or alcohol. Food safety  Do not eat raw or undercooked beef.  Do not drink unpasteurized milk or eat cheese that was made with raw milk. Do not drink  unpasteurized apple cider.  Wash cutting boards, counters, and utensils with hot soap and water after you prepare raw meat.  Wash all fruits and vegetables before you eat or cook them. General instructions   Take over-the-counter and prescription medicines only as told by your health care provider.  Wash your hands thoroughly: ? Before and after you prepare food. ? After you use the bathroom. ? Before you eat. ? After touching animals, especially cattle. ? After caring for an ill person.  Make sure people who live with you also wash their hands often. If soap and water are not available, use alcohol-based hand sanitizer.  Clean surfaces that you touch with a product that contains chlorine bleach.  Keep all follow-up visits as told by your health care provider. This is important. Contact a health care provider if:  Your  symptoms do not get better or get worse.  You have new symptoms. Get help right away if you:  Have increasing pain or tenderness in your abdomen.  Have ongoing (persistent) vomiting or diarrhea.  Have abdominal pain that stays in one area (localizes).  Have diarrhea with more blood in it.  Have a fever.  Cannot eat or drink without vomiting.  Have signs of dehydration or HUS, such as: ? Pale skin. ? Dark urine, very little urine, or no urine. ? Cracked lips. ? Not making tears while crying. ? Dry mouth. ? Sunken eyes. ? Sleepiness. ? Weakness. ? Dizziness. Summary  E. coli are bacteria that can cause an infection in different parts of your body, including your intestines. Most types of E. coli do not cause infections, but some produce a toxin that can cause diarrhea.  Treatment for this condition includes rest and fluids. If you have severe diarrhea, you may need to receive fluids through an IV.  Symptoms of E. coli intestinal infection usually go away in 5-10 days.  Follow your health care provider's instructions about medicines, eating and drinking, food safety and general hygiene, and when to call for help. This information is not intended to replace advice given to you by your health care provider. Make sure you discuss any questions you have with your health care provider. Document Revised: 09/11/2018 Document Reviewed: 06/26/2017 Elsevier Patient Education  [place].  Sepsis, Diagnosis, Adult Sepsis is a serious bodily reaction to an infection. The infection that triggers sepsis may be from a bacteria, virus, or fungus. Sepsis can result from an infection in any part of your body. Infections that commonly lead to sepsis include skin, lung, and urinary tract infections. Sepsis is a medical emergency that must be treated right away in a hospital. In severe cases, it can lead to septic shock. Septic shock can weaken your heart and cause your blood pressure to  drop. This can cause your central nervous system and your body's organs to stop working. What are the causes? This condition is caused by a severe reaction to infections from bacteria, viruses, or fungus. The germs that most often lead to sepsis include:  Escherichia coli (E. coli) bacteria.  Staphylococcus aureus (staph) bacteria.  Some types of Streptococcus bacteria. The most common infections affect these organs:  The lung (pneumonia).  The kidneys or bladder (urinary tract infection).  The skin (cellulitis).  The bowel, gallbladder, or pancreas. What increases the risk? You are more likely to develop this condition if:  Your body's disease-fighting system (immune system) is weakened.  You are age 57 or older.  You are female.  You had surgery or you have been hospitalized.  You have these devices inserted into your body: ? A small, thin tube (catheter). ? IV line. ? Breathing tube. ? Drainage tube.  You are not getting enough nutrients from food (malnourished).  You have a long-term (chronic) disease, such as cancer, lung disease, kidney disease, or diabetes.  You are African American. What are the signs or symptoms? Symptoms of this condition may include:  Fever.  Chills or feeling very cold.  Confusion or anxiety.  Fatigue.  Muscle aches.  Shortness of breath.  Nausea and vomiting.  Urinating much less than usual.  Fast heart rate (tachycardia).  Rapid breathing (hyperventilation).  Changes in skin color. Your skin may look blotchy, pale, or blue.  Cool, clammy, or sweaty skin.  Skin rash. Other symptoms depend on the source of your infection. How is this diagnosed? This condition is diagnosed based on:  Your symptoms.  Your medical history.  A physical exam. Other tests may also be done to find out the cause of the infection and how severe the sepsis is. These tests may include:  Blood tests.  Urine tests.  Swabs from other  areas of your body that may have an infection. These samples may be tested (cultured) to find out what type of bacteria is causing the infection.  Chest X-ray to check for pneumonia. Other imaging tests, such as a CT scan, may also be done.  Lumbar puncture. This removes a small amount of the fluid that surrounds your brain and spinal cord. The fluid is then examined for infection. How is this treated? This condition must be treated in a hospital. Based on the cause of your infection, you may be given an antibiotic, antiviral, or antifungal medicine. You may also receive:  Fluids through an IV.  Oxygen and breathing assistance.  Medicines to increase your blood pressure.  Kidney dialysis. This process cleans your blood if your kidneys have failed.  Surgery to remove infected tissue.  Blood transfusion if needed.  Medicine to prevent blood clots.  Nutrients to correct imbalances in basic body function (metabolism). You may: ? Receive important salts and minerals (electrolytes) through an IV. ? Have your blood sugar level adjusted. Follow these instructions at home: Medicines   Take over-the-counter and prescription medicines only as told by your health care provider.  If you were prescribed an antibiotic, antiviral, or antifungal medicine, take it as told by your health care provider. Do not stop taking the medicine even if you start to feel better. General instructions  If you have a catheter or other indwelling device, ask to have it removed as soon as possible.  Keep all follow-up visits as told by your health care provider. This is important. Contact a health care provider if:  You do not feel like you are getting better or regaining strength.  You are having trouble coping with your recovery.  You frequently feel tired.  You feel worse or do not seem to get better after surgery.  You think you may have an infection after surgery. Get help right away if:  You  have any symptoms of sepsis.  You have difficulty breathing.  You have a rapid or skipping heartbeat.  You become confused or disoriented.  You have a high fever.  Your skin becomes blotchy, pale, or blue.  You have an infection that is getting worse or not getting better. These symptoms may represent a serious problem that is an emergency. Do not wait  to see if the symptoms will go away. Get medical help right away. Call your local emergency services (911 in the [place] Do not drive yourself to the hospital. Summary  Sepsis is a medical emergency that requires immediate treatment in a hospital.  This condition is caused by a severe reaction to infections from bacteria, viruses, or fungus.  Based on the cause of your infection, you may be given an antibiotic, antiviral, or antifungal medicine.  Treatment may also include IV fluids, breathing assistance, and kidney dialysis. This information is not intended to replace advice given to you by your health care provider. Make sure you discuss any questions you have with your health care provider. Document Revised: 12/27/2017 Document Reviewed: 12/27/2017 Elsevier Patient Education  2020 [employer]  Bacteremia, Adult Bacteremia is the presence of bacteria in the blood. When bacteria enter the bloodstream, they can cause a life-threatening reaction called sepsis. Sepsis is a medical emergency. What are the causes? This condition is caused by bacteria that get into the blood. Bacteria can enter the blood from an infection, including:  A skin infection or injury, such as a burn or a cut.  A lung infection (pneumonia).  An infection in the stomach or intestines.  An infection in the bladder or urinary system (urinary tract infection).  A bacterial infection in another part of the body that spreads to the blood. Bacteria can also enter the blood during a dental or medical procedure, from bleeding gums, or through use of an unclean  needle. What increases the risk? This condition is more likely to develop in children, older adults, and people who have:  A long-term (chronic) disease or condition like diabetes or chronic kidney failure.  An artificial joint or heart valve, or heart valve disease.  A tube inserted to treat a medical condition, such as a urinary catheter or IV.  A weak disease-fighting system (immune system).  Injected illegal drugs.  Been hospitalized for more than 10 days in a row. What are the signs or symptoms? Symptoms of this condition include:  Fever and chills.  Fast heartbeat and shortness of breath.  Dizziness, weakness, and low blood pressure.  Confusion or anxiety.  Pain in the abdomen, nausea, vomiting, and diarrhea. Bacteremia that has spread to other parts of the body may cause symptoms in those areas. In some cases, there are no symptoms. How is this diagnosed? This condition may be diagnosed with a physical exam and tests, such as:  Blood tests to check for bacteria (cultures) or other signs of infection.  Tests of any tubes that you have had inserted. These tests check for a source of infection.  Urine tests to check for bacteria in the urine.  Imaging tests, such as an X-ray, a CT scan, an MRI, or a heart ultrasound. These check for a source of infection in other parts of your body, such as your lungs, heart valves, or joints. How is this treated? This condition is usually treated in the hospital. If you are treated at home, you may need to return to the hospital for medicines, blood tests, and evaluation. Treatment may include:  Antibiotic medicines. These may be given by mouth or directly into your blood through an IV. You may need antibiotics for several weeks. At first, you may be given an antibiotic to kill most types of blood bacteria. If tests show that a certain kind of bacteria is causing the problem, you may be given a different antibiotic.  IV  fluids.  Removing any catheter or device that could be a source of infection.  Blood pressure and breathing support, if needed.  Surgery to control the source or the spread of infection, such as surgery to remove an implanted device, abscess, or infected tissue. Follow these instructions at home: Medicines  Take over-the-counter and prescription medicines only as told by your health care provider.  If you were prescribed an antibiotic medicine, take it as told by your health care provider. Do not stop taking the antibiotic even if you start to feel better. General instructions   Rest as needed. Ask your health care provider when you may return to normal activities.  Drink enough fluid to keep your urine pale yellow.  Do not use any products that contain nicotine or tobacco, such as cigarettes, e-cigarettes, and chewing tobacco. If you need help quitting, ask your health care provider.  Keep all follow-up visits as told by your health care provider. This is important. How is this prevented?   Wash your hands regularly with soap and water. If soap and water are not available, use hand sanitizer.  You should wash your hands: ? After using the toilet or changing a diaper. ? Before preparing, cooking, serving, or eating food. ? While caring for a sick person or while visiting someone in a hospital. ? Before and after changing bandages (dressings) over wounds.  Clean any scrapes or cuts with soap and water and cover them with a clean bandage.  Get vaccinations as recommended by your health care provider.  Practice good oral hygiene. Brush your teeth two times a day, and floss regularly.  Take good care of your skin. This includes bathing and moisturizing on a regular basis. Contact a health care provider if:  Your symptoms get worse, and medicines do not help.  You have severe pain. Get help right away if you have:  Pain.  A fever or chills.  Trouble breathing.  A fast  heart rate.  Skin that is blotchy, pale, or clammy.  Confusion.  Weakness.  Lack of energy or unusual sleepiness.  New symptoms that develop after treatment has started. These symptoms may represent a serious problem that is an emergency. Do not wait to see if the symptoms will go away. Get medical help right away. Call your local emergency services (911 in the [place] Do not drive yourself to the hospital. Summary  Bacteremia is the presence of bacteria in the blood. When bacteria enter the bloodstream, they can cause a life-threatening reaction called sepsis.  Bacteremia is usually treated with antibiotic medicines in the hospital.  If you were prescribed an antibiotic medicine, take it as told by your health care provider. Do not stop taking the antibiotic even if you start to feel better.  Get help right away if you have any new symptoms that develop after treatment has started. This information is not intended to replace advice given to you by your health care provider. Make sure you discuss any questions you have with your health care provider. Document Revised: 10/10/2018 Document Reviewed: 10/10/2018 Elsevier Patient Education  2020 [employer].

## 2019-08-21 NOTE — Discharge Summary (Addendum)
Discharge Summary  Tiffany [name] DOB: 09-15-1985  PCP: Department, [facility]  Admit date: 08/17/2019 Discharge date: 08/21/2019  Time spent: 35 minutes   Recommendations for Outpatient Follow-up:  1. Follow-up with your PCP 1-2 weeks 2. Take your medications as prescribed  Discharge Diagnoses:  Active Hospital Problems   Diagnosis Date Noted  . Acute pyelonephritis 08/17/2019  . SIRS (systemic inflammatory response syndrome) (HCC) 08/17/2019  . Hypokalemia 08/17/2019  . Polysubstance abuse (HCC) 10/21/2011  . Depression 10/21/2011    Resolved Hospital Problems  No resolved problems to display.    Discharge Condition: Stable  Diet recommendation: Resume previous diet  Vitals:   08/20/19 2055 08/21/19 0451  BP: 108/63 116/76  Pulse: 67 62  Resp: 18 16  Temp:  98.6 F (37 C)  SpO2: 100% 100%    History of present illness:  Tiffany [name] a 34 y.o.femalewith history h/opolysubstance abuse (marijuana, cocaine, alcohol), depression, PID, hypertension but not on any medications, prior UTIs presents with complaints of left flank pain. She denies any recent IV drug abuse but reports smoking marijuana and drinking alcohol day prior to presentation. Temp 102.30F, WBC 15.4 K,Urine analysis positive for pyuria. Patient received 1 dose of IV Rocephin in the ED and [facility] asked to admit.  Blood cultures positive for e-coli 2/2 bottles, pansensitive.  08/21/19: Seen and examined.  States she feels a lot better.  Eager to go home.  Labs and Vital signs reviewed and are stable.    Hospital Course:  Principal Problem:   Acute pyelonephritis Active Problems:   Polysubstance abuse (HCC)   Depression   SIRS (systemic inflammatory response syndrome) (HCC)   Hypokalemia  Sepsis, resolved, secondary to acute left pyelonephritis/E. coli bacteremia Presented with leukocytosis 15 K and fever with T-max of 103.1.   UA + for pyuria Evidence of  pyelonephritis on CT scan with possible phlegmon formation, no drainage fluid collection or abscess. Blood cultures + for E. Coli x2 bottles, pansensitive. Urine culture suggest recollection Received 4 days of Rocephin. Repeat blood cultures x2 peripherally on 08/19/2019 negative to date Leukocytosis and neutrophilia resolved.  Afebrile. Pain management with 1 tablet of oxycodone daily as needed for severe pain. Tylenol for mild to moderate pain. Follow-up with your PCP posthospitalization  Polysubstance abuse including alcohol and THC Polysubstance abuse cessation counseling No signs of alcohol withdrawal at the time of exam.  Resolved hypokalemia post repletion Potassium 4.0 magnesium 2.0.     Code Status:Full code.    Consults called:None   Discharge Exam: BP 116/76 (BP Location: Left Arm)   Pulse 62   Temp 98.6 F (37 C) (Oral)   Resp 16   Ht 5\' 6"  (1.676 m)   Wt 63.1 kg   LMP 07/27/2019   SpO2 100%   BMI 22.45 kg/m  . General: 34 y.o. year-old female well developed well nourished in no acute distress.  Alert and oriented x3. . Cardiovascular: Regular rate and rhythm with no rubs or gallops.  No thyromegaly or JVD noted.   20 Respiratory: Clear to auscultation with no wheezes or rales. Good inspiratory effort. . Abdomen: Soft nontender nondistended with normal bowel sounds x4 quadrants. . Musculoskeletal: No lower extremity edema. 2/4 pulses in all 4 extremities. Marland Kitchen Psychiatry: Mood is appropriate for condition and setting  Discharge Instructions You were cared for by a hospitalist during your hospital stay. If you have any questions about your discharge medications or the care you received while you were in  the hospital after you are discharged, you can call the unit and asked to speak with the hospitalist on call if the hospitalist that took care of you is not available. Once you are discharged, your primary care physician will handle any further medical  issues. Please note that NO REFILLS for any discharge medications will be authorized once you are discharged, as it is imperative that you return to your primary care physician (or establish a relationship with a primary care physician if you do not have one) for your aftercare needs so that they can reassess your need for medications and monitor your lab values.   Allergies as of 08/21/2019   No Known Allergies     Medication List    STOP taking these medications   doxycycline 100 MG capsule Commonly known as: VIBRAMYCIN   ibuprofen 600 MG tablet Commonly known as: ADVIL     TAKE these medications   oxyCODONE 5 MG immediate release tablet Commonly known as: Oxy IR/ROXICODONE Take 1 tablet (5 mg total) by mouth daily as needed for up to 5 days for severe pain.   polyethylene glycol 17 g packet Commonly known as: MIRALAX / GLYCOLAX Take 17 g by mouth daily as needed.   saccharomyces boulardii 250 MG capsule Commonly known as: Florastor Take 1 capsule (250 mg total) by mouth 2 (two) times daily for 10 days.   sulfamethoxazole-trimethoprim 800-160 MG tablet Commonly known as: BACTRIM DS Take 1 tablet by mouth every 12 (twelve) hours for 10 days.      No Known Allergies Follow-up Information    [facility] Call in 1 day(s).   Why: Please call for a post hospital follow up appointment Contact information: [address] [place] [id number] [phone]            The results of significant diagnostics from this hospitalization (including imaging, microbiology, ancillary and laboratory) are listed below for reference.    Significant Diagnostic Studies: CT ABDOMEN PELVIS W CONTRAST  Result Date: 08/17/2019 CLINICAL DATA:  Acute nonlocalized abdominal pain. EXAM: CT ABDOMEN AND PELVIS WITH CONTRAST TECHNIQUE: Multidetector CT imaging of the abdomen and pelvis was performed using the standard protocol following bolus administration of  intravenous contrast. CONTRAST:  100mL OMNIPAQUE IOHEXOL 300 MG/ML  SOLN COMPARISON:  CT 07/04/2017 FINDINGS: Lower chest: Accessory fissure at the right lung base. No acute findings. No focal airspace disease or pleural fluid. Hepatobiliary: There tiny scattered hepatic hypodensities that are too small to accurately characterize. Gallbladder physiologically distended, no calcified stone. No biliary dilatation. Pancreas: No ductal dilatation or inflammation. Spleen: Normal in size without focal abnormality. Adrenals/Urinary Tract: Normal adrenal glands. Heterogeneous left renal enhancement. Ill-defined cortical hypodensity, series 2, image 23. mild left perinephric edema. No hydronephrosis. There is mild left ureteral enhancement. Homogeneous right renal enhancement. No focal right renal abnormality. Right ureter decompressed. Distended urinary bladder without wall thickening. Stomach/Bowel: Bowel evaluation is limited in the absence of enteric contrast and paucity of intra-abdominal fat. Stomach partially distended. Normal positioning of the ligament of Treitz. Central small bowel loops are fluid-filled but nondilated. There is no evidence of obstruction. No definite small bowel inflammation or wall thickening. Normal air-filled appendix in the right lower quadrant. Small volume of colonic stool. Sigmoid colon is redundant. No colonic wall thickening. Vascular/Lymphatic: Mild aortic atherosclerosis, age advanced. Portal vein is patent. Mesenteric vessels are grossly patent. Prominent adnexal and periuterine vascularity with dilatation of the left ovarian vein 6 mm with reflux of contrast.  Few prominent left retroperitoneal nodes are nonspecific but likely reactive. Reproductive: Probable corpus luteal cyst in the right ovary. Uterus and left adnexa are unremarkable. There is prominent periuterine and adnexal vascularity. Other: Small amount of free fluid in the pelvis. No free air. Musculoskeletal: There are no  acute or suspicious osseous abnormalities. IMPRESSION: 1. Findings consistent with left pyelonephritis. Ill-defined low-density in the left upper kidney concerning for phlegmon formation, no drainable fluid collection/abscess. 2. Prominent adnexal and periuterine vascularity with dilatation of the left ovarian vein, can be seen with pelvic congestion syndrome. 3. Few prominent fluid-filled loops of small bowel in the central abdomen, likely reactive. 4. Aortic atherosclerosis, mild but age advanced. Aortic Atherosclerosis (ICD10-I70.0). Aortic Atherosclerosis (ICD10-I70.0). Electronically Signed   By: Narda Rutherford M.D.   On: 08/17/2019 17:42    Microbiology: Recent Results (from the past 240 hour(s))  Culture, blood (x 2)     Status: Abnormal   Collection Time: 08/17/19 12:01 PM   Specimen: BLOOD  Result Value Ref Range Status   Specimen Description BLOOD LEFT ANTECUBITAL  Final   Special Requests   Final    BOTTLES DRAWN AEROBIC AND ANAEROBIC Blood Culture results may not be optimal due to an excessive volume of blood received in culture bottles Performed at [facility], [address], [place], [place] [address]    Culture  Setup Time   Final    GRAM NEGATIVE RODS IN BOTH AEROBIC AND ANAEROBIC BOTTLES CRITICAL RESULT CALLED TO, READ BACK BY AND VERIFIED WITH: Thana Ates [id number] 08/18/2019 Girtha Hake Performed at [facility], [address], [place], [place] [address]    Culture ESCHERICHIA COLI (A)  Final   Report Status 08/20/2019 FINAL  Final   Organism ID, Bacteria ESCHERICHIA COLI  Final      Susceptibility   Escherichia coli - MIC*    AMPICILLIN <=2 SENSITIVE Sensitive     CEFAZOLIN <=4 SENSITIVE Sensitive     CEFEPIME <=0.12 SENSITIVE Sensitive     CEFTAZIDIME <=1 SENSITIVE Sensitive     CEFTRIAXONE <=0.25 SENSITIVE Sensitive     CIPROFLOXACIN <=0.25 SENSITIVE Sensitive     GENTAMICIN <=1 SENSITIVE Sensitive     IMIPENEM <=0.25 SENSITIVE  Sensitive     TRIMETH/SULFA <=20 SENSITIVE Sensitive     AMPICILLIN/SULBACTAM <=2 SENSITIVE Sensitive     PIP/TAZO <=4 SENSITIVE Sensitive     * ESCHERICHIA COLI  Urine culture     Status: Abnormal   Collection Time: 08/17/19 12:01 PM   Specimen: Urine, Catheterized  Result Value Ref Range Status   Specimen Description   Final    URINE, CATHETERIZED Performed at [facility], [address] [place], [place] [address]    Special Requests   Final    Normal Performed at [facility], [address] [place], [place] [address]    Culture MULTIPLE SPECIES PRESENT, SUGGEST RECOLLECTION (A)  Final   Report Status 08/18/2019 FINAL  Final  Blood Culture ID Panel (Reflexed)     Status: Abnormal   Collection Time: 08/17/19 12:01 PM  Result Value Ref Range Status   Enterococcus species NOT DETECTED NOT DETECTED Final   Listeria monocytogenes NOT DETECTED NOT DETECTED Final   Staphylococcus species NOT DETECTED NOT DETECTED Final   Staphylococcus aureus (BCID) NOT DETECTED NOT DETECTED Final   Streptococcus species NOT DETECTED NOT DETECTED Final   Streptococcus agalactiae NOT DETECTED NOT DETECTED Final   Streptococcus pneumoniae NOT DETECTED NOT DETECTED  Final   Streptococcus pyogenes NOT DETECTED NOT DETECTED Final   Acinetobacter baumannii NOT DETECTED NOT DETECTED Final   Enterobacteriaceae species DETECTED (A) NOT DETECTED Final    Comment: Enterobacteriaceae represent a large family of gram-negative bacteria, not a single organism. CRITICAL RESULT CALLED TO, READ BACK BY AND VERIFIED WITH: J. GADHIA,PHARMD [id number] 08/18/2019 T. TYSOR    Enterobacter cloacae complex NOT DETECTED NOT DETECTED Final   Escherichia coli DETECTED (A) NOT DETECTED Final    Comment: CRITICAL RESULT CALLED TO, READ BACK BY AND VERIFIED WITH: J. Garth Schlatter [id number] 08/18/2019 T. TYSOR    Klebsiella oxytoca NOT DETECTED NOT DETECTED Final   Klebsiella pneumoniae NOT  DETECTED NOT DETECTED Final   Proteus species NOT DETECTED NOT DETECTED Final   Serratia marcescens NOT DETECTED NOT DETECTED Final   Carbapenem resistance NOT DETECTED NOT DETECTED Final   Haemophilus influenzae NOT DETECTED NOT DETECTED Final   Neisseria meningitidis NOT DETECTED NOT DETECTED Final   Pseudomonas aeruginosa NOT DETECTED NOT DETECTED Final   Candida albicans NOT DETECTED NOT DETECTED Final   Candida glabrata NOT DETECTED NOT DETECTED Final   Candida krusei NOT DETECTED NOT DETECTED Final   Candida parapsilosis NOT DETECTED NOT DETECTED Final   Candida tropicalis NOT DETECTED NOT DETECTED Final    Comment: Performed at [facility], [address], [place], [place] [address]  Culture, blood (x 2)     Status: Abnormal   Collection Time: 08/17/19 12:02 PM   Specimen: BLOOD  Result Value Ref Range Status   Specimen Description   Final    BLOOD RIGHT ANTECUBITAL Performed at [facility], [address] [place], [place] [address]    Special Requests   Final    BOTTLES DRAWN AEROBIC AND ANAEROBIC Blood Culture adequate volume Performed at [facility], [address] [place], [place] [address]    Culture  Setup Time   Final    GRAM NEGATIVE RODS IN BOTH AEROBIC AND ANAEROBIC BOTTLES CRITICAL RESULT CALLED TO, READ BACK BY AND VERIFIED WITH: J. Garth Schlatter [id number] 08/18/2019 T. TYSOR    Culture (A)  Final    ESCHERICHIA COLI SUSCEPTIBILITIES PERFORMED ON PREVIOUS CULTURE WITHIN THE LAST 5 DAYS. Performed at [facility], [address] [place], [place] [address]    Report Status 08/20/2019 FINAL  Final  SARS CORONAVIRUS 2 (TAT 6-24 HRS) Nasopharyngeal Nasopharyngeal Swab     Status: None   Collection Time: 08/17/19  4:22 PM   Specimen: Nasopharyngeal Swab  Result Value Ref Range Status   SARS Coronavirus 2 NEGATIVE NEGATIVE Final    Comment: (NOTE) SARS-CoV-2 target nucleic acids are NOT DETECTED. The SARS-CoV-2 RNA is  generally detectable in upper and lower respiratory specimens during the acute phase of infection. Negative results do not preclude SARS-CoV-2 infection, do not rule out co-infections with other pathogens, and should not be used as the sole basis for treatment or other patient management decisions. Negative results must be combined with clinical observations, patient history, and epidemiological information. The expected result is Negative. Fact Sheet for Patients: HairSlick.no Fact Sheet for Healthcare Providers: quierodirigir.com This test is not yet approved or cleared by the [place] FDA and  has been authorized for detection and/or diagnosis of SARS-CoV-2 by FDA under an Emergency Use Authorization (EUA). This EUA will remain  in effect (meaning this test can be used) for the duration of the COVID-19 declaration under Section 56 4(b)(1) of the Act, 21 U.S.C.  section 360bbb-3(b)(1), unless the authorization is terminated or revoked sooner. Performed at [facility], [place] [address] [place], [place] [address]   Culture, blood (routine x 2)     Status: None (Preliminary result)   Collection Time: 08/19/19  3:22 PM   Specimen: BLOOD RIGHT HAND  Result Value Ref Range Status   Specimen Description BLOOD RIGHT HAND  Final   Special Requests   Final    BOTTLES DRAWN AEROBIC AND ANAEROBIC Blood Culture adequate volume   Culture   Final    NO GROWTH < 12 HOURS Performed at [facility], [place] [address] [place], [place] [address]    Report Status PENDING  Incomplete  Culture, blood (routine x 2)     Status: None (Preliminary result)   Collection Time: 08/19/19  3:29 PM   Specimen: BLOOD LEFT HAND  Result Value Ref Range Status   Specimen Description   Final    BLOOD LEFT HAND BOTTLES DRAWN AEROBIC AND ANAEROBIC Blood Culture adequate volume Performed at [place] [address]  [place], [place] [address]    Special Requests   Final    BOTTLES DRAWN AEROBIC AND ANAEROBIC Blood Culture adequate volume   Culture   Final    NO GROWTH < 12 HOURS Performed at [facility], [place] [address], [place], [place] [address]    Report Status PENDING  Incomplete     Labs: Basic Metabolic Panel: Recent Labs  Lab 08/17/19 1201 08/18/19 0527 08/20/19 0455  NA 132* 137 139  K 3.2* 4.1 4.0  CL 98 107 108  CO2 22 22 25   GLUCOSE 134* 133* 98  BUN 6 7 10   CREATININE 0.80 0.93 0.75  CALCIUM 8.9 8.4* 8.4*  MG  --   --  2.0   Liver Function Tests: Recent Labs  Lab 08/17/19 1201 08/20/19 0455  AST 20 36  ALT 14 44  ALKPHOS 73 64  BILITOT 0.6 0.5  PROT 7.9 6.1*  ALBUMIN 4.1 2.7*   No results for input(s): LIPASE, AMYLASE in the last 168 hours. No results for input(s): AMMONIA in the last 168 hours. CBC: Recent Labs  Lab 08/17/19 1201 08/18/19 0527 08/19/19 0712 08/20/19 0455  WBC 15.4* 11.3* 7.8 6.1  NEUTROABS 12.8*  --  5.0 3.2  HGB 13.8 12.2 10.4* 10.9*  HCT 41.9 38.0 32.1* 33.6*  MCV 93.5 96.4 96.1 94.9  PLT 318 232 228 258   Cardiac Enzymes: No results for input(s): CKTOTAL, CKMB, CKMBINDEX, TROPONINI in the last 168 hours. BNP: BNP (last 3 results) No results for input(s): BNP in the last 8760 hours.  ProBNP (last 3 results) No results for input(s): PROBNP in the last 8760 hours.  CBG: No results for input(s): GLUCAP in the last 168 hours.     Signed:  Kayleen Memos, MD [facility] 08/21/2019, 9:40 AM

## 2019-08-21 NOTE — Progress Notes (Signed)
Patient was discharged home with no needs, patient's questions were answered and she was taken to main entrance.

## 2019-08-24 LAB — CULTURE, BLOOD (ROUTINE X 2)
Culture: NO GROWTH
Culture: NO GROWTH
Special Requests: ADEQUATE
Special Requests: ADEQUATE

## 2019-11-13 ENCOUNTER — Ambulatory Visit: Payer: Medicaid Other | Admitting: Obstetrics

## 2019-11-25 ENCOUNTER — Other Ambulatory Visit (HOSPITAL_COMMUNITY)
Admission: RE | Admit: 2019-11-25 | Discharge: 2019-11-25 | Disposition: A | Discharge status: Home / Self Care | Payer: Medicaid Other | Source: Ambulatory Visit | Attending: Obstetrics | Admitting: Obstetrics

## 2019-11-25 ENCOUNTER — Ambulatory Visit (INDEPENDENT_AMBULATORY_CARE_PROVIDER_SITE_OTHER): Payer: Medicaid Other | Admitting: Obstetrics

## 2019-11-25 ENCOUNTER — Other Ambulatory Visit: Payer: Self-pay

## 2019-11-25 ENCOUNTER — Encounter: Payer: Self-pay | Admitting: Obstetrics

## 2019-11-25 VITALS — BP 114/75 | HR 69 | Ht 66.0 in | Wt 134.9 lb

## 2019-11-25 DIAGNOSIS — Z01419 Encounter for gynecological examination (general) (routine) without abnormal findings: Secondary | ICD-10-CM | POA: Diagnosis present

## 2019-11-25 DIAGNOSIS — N898 Other specified noninflammatory disorders of vagina: Secondary | ICD-10-CM | POA: Diagnosis present

## 2019-11-25 DIAGNOSIS — Z113 Encounter for screening for infections with a predominantly sexual mode of transmission: Secondary | ICD-10-CM | POA: Diagnosis not present

## 2019-11-25 DIAGNOSIS — F17200 Nicotine dependence, unspecified, uncomplicated: Secondary | ICD-10-CM

## 2019-11-25 NOTE — Progress Notes (Signed)
New patient in the office for annual. Pt is unsure of last pap. 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 female here for a routine exam.  Current complaints: Recurrent BV.    Personal health questionnaire:  Is patient Ashkenazi Jewish, have a family history of breast and/or ovarian cancer: no Is there a family history of uterine cancer diagnosed at age < 38, gastrointestinal cancer, urinary tract cancer, family member who is a Field seismologist syndrome-associated carrier: no Is the patient overweight and hypertensive, family history of diabetes, personal history of gestational diabetes, preeclampsia or PCOS: no Is patient over 29, have PCOS,  family history of premature CHD under age 82, diabetes, smoke, have hypertension or peripheral artery disease:  no At any time, has a partner hit, kicked or otherwise hurt or frightened you?: no Over the past 2 weeks, have you felt down, depressed or hopeless?: no Over the past 2 weeks, have you felt little interest or pleasure in doing things?:no   Gynecologic History Patient's last menstrual period was 11/11/2019. Contraception: none Last Pap: unknown. Results were: unknown Last mammogram: n/a. Results were: n/a  Obstetric History OB History  Gravida Para Term Preterm AB Living  3 2 1   1 1   SAB TAB Ectopic Multiple Live Births  1       1    # Outcome Date GA Lbr Len/2nd Weight Sex Delivery Anes PTL Lv  3 Term 09/25/03     Vag-Spont   LIV     Birth Comments: System Generated. Please review and update pregnancy details.  2 SAB           1 Para             Past Medical History:  Diagnosis Date  . Addiction, marijuana ([place])   . Alcohol abuse   . Chlamydia   . Cocaine addiction ([place])   . Depression   . History of concussion   . Hypertension    does not take any meds    Past Surgical History:  Procedure Laterality Date  . WISDOM TOOTH EXTRACTION  2007     Current Outpatient Medications:  .   polyethylene glycol (MIRALAX / GLYCOLAX) 17 g packet, Take 17 g by mouth daily as needed. (Patient not taking: Reported on 11/25/2019), Disp: 14 each, Rfl: 0 No Known Allergies  Social History   Tobacco Use  . Smoking status: Current Every Day Smoker    Packs/day: 1.00    Years: 9.00    Pack years: 9.00    Types: Cigarettes  . Smokeless tobacco: Never Used  Substance Use Topics  . Alcohol use: Yes    Alcohol/week: 13.0 standard drinks    Types: 12 Cans of beer, 1 Standard drinks or equivalent per week    Comment: daily    Family History  Problem Relation Age of Onset  . Hypertension Other   . Diabetes Other   . Heart failure Mother   . Hypertension Mother   . Cervical cancer Paternal Aunt       Review of Systems  Constitutional: negative for fatigue and weight loss Respiratory: negative for cough and wheezing Cardiovascular: negative for chest pain, fatigue and palpitations Gastrointestinal: negative for abdominal pain and change in bowel habits Musculoskeletal:negative for myalgias Neurological: negative for gait problems and tremors Behavioral/Psych: negative for abusive relationship, depression Endocrine: negative for temperature intolerance    Genitourinary:negative for abnormal  menstrual periods, genital lesions, hot flashes, sexual problems and vaginal discharge Integument/breast: negative for breast lump, breast tenderness, nipple discharge and skin lesion(s)    Objective:       BP 114/75   Pulse 69   Ht 5\' 6"  (1.676 m)   Wt 134 lb 14.4 oz (61.2 kg)   LMP 11/11/2019   BMI 21.77 kg/m  General:   alert  Skin:   no rash or abnormalities  Lungs:   clear to auscultation bilaterally  Heart:   regular rate and rhythm, S1, S2 normal, no murmur, click, rub or gallop  Breasts:   normal without suspicious masses, skin or nipple changes or axillary nodes  Abdomen:  normal findings: no organomegaly, soft, non-tender and no hernia  Pelvis:  External genitalia: normal  general appearance Urinary system: urethral meatus normal and bladder without fullness, nontender Vaginal: normal without tenderness, induration or masses Cervix: normal appearance Adnexa: normal bimanual exam Uterus: anteverted and non-tender, normal size   Lab Review Urine pregnancy test Labs reviewed yes Radiologic studies reviewed no  50% of 20 min visit spent on counseling and coordination of care.   Assessment:    Healthy female exam.    Plan:    Education reviewed: calcium supplements, depression evaluation, low fat, low cholesterol diet, safe sex/STD prevention, self breast exams, smoking cessation and weight bearing exercise. Contraception: condoms. Follow up in: 1 year.    Orders Placed This Encounter  Procedures  . HIV antibody (with reflex)  . RPR    01/11/2020, MD 11/25/2019 12:04 PM

## 2019-11-26 ENCOUNTER — Other Ambulatory Visit: Payer: Self-pay | Admitting: Obstetrics

## 2019-11-26 DIAGNOSIS — A5403 Gonococcal cervicitis, unspecified: Secondary | ICD-10-CM

## 2019-11-26 LAB — CERVICOVAGINAL ANCILLARY ONLY
Chlamydia: NEGATIVE
Comment: NEGATIVE
Comment: NORMAL
Neisseria Gonorrhea: POSITIVE — AB

## 2019-11-26 LAB — HIV ANTIBODY (ROUTINE TESTING W REFLEX): HIV Screen 4th Generation wRfx: NONREACTIVE

## 2019-11-26 LAB — RPR: RPR Ser Ql: NONREACTIVE

## 2019-11-26 MED ORDER — CEFTRIAXONE SODIUM 1 G IJ SOLR: 1.0000 g | Freq: Once | INTRAMUSCULAR | 0 refills | Status: AC

## 2019-11-29 LAB — CYTOLOGY - PAP
Comment: NEGATIVE
Diagnosis: NEGATIVE
Diagnosis: REACTIVE
High risk HPV: NEGATIVE

## 2019-12-08 ENCOUNTER — Telehealth: Payer: Self-pay

## 2019-12-08 NOTE — Telephone Encounter (Signed)
Patient called in for results, advised of results and need for rocephin appt, transferred to scheduler. Faxed form to HD

## 2019-12-10 ENCOUNTER — Ambulatory Visit: Payer: Medicaid Other

## 2019-12-15 ENCOUNTER — Ambulatory Visit (INDEPENDENT_AMBULATORY_CARE_PROVIDER_SITE_OTHER): Payer: Medicaid Other

## 2019-12-15 ENCOUNTER — Other Ambulatory Visit: Payer: Self-pay

## 2019-12-15 DIAGNOSIS — A549 Gonococcal infection, unspecified: Secondary | ICD-10-CM

## 2019-12-15 MED ORDER — CEFTRIAXONE SODIUM 500 MG IJ SOLR
500.0000 mg | Freq: Once | INTRAMUSCULAR | Status: AC
  Administered 2019-12-15: 500 mg via INTRAMUSCULAR

## 2019-12-15 NOTE — Progress Notes (Signed)
Nurse visit for Rocephin 500 mg diluted with 1% Lidocaine - Dx: GC Injection given R upper outer quad w/o difficulty  To avoid reinfection, pt is aware to refrain from unprotected IC until both her and partner's TOC is negative, pt agrees.

## 2019-12-17 NOTE — Progress Notes (Signed)
Patient was assessed and managed by nursing staff during this encounter. I have reviewed the chart and agree with the documentation and plan. I have also made any necessary editorial changes.  Warden Fillers, MD 12/17/2019 8:08 AM

## 2020-01-14 ENCOUNTER — Ambulatory Visit: Payer: Medicaid Other

## 2020-03-26 ENCOUNTER — Encounter: Payer: Self-pay | Admitting: Emergency Medicine

## 2020-03-26 ENCOUNTER — Emergency Department
Admission: EM | Admit: 2020-03-26 | Discharge: 2020-03-26 | Disposition: A | Discharge status: Home / Self Care | Payer: Medicaid Other | Attending: Student in an Organized Health Care Education/Training Program | Admitting: Student in an Organized Health Care Education/Training Program

## 2020-03-26 ENCOUNTER — Other Ambulatory Visit: Payer: Self-pay

## 2020-03-26 ENCOUNTER — Emergency Department: Payer: Medicaid Other

## 2020-03-26 DIAGNOSIS — F17210 Nicotine dependence, cigarettes, uncomplicated: Secondary | ICD-10-CM | POA: Insufficient documentation

## 2020-03-26 DIAGNOSIS — M549 Dorsalgia, unspecified: Secondary | ICD-10-CM | POA: Insufficient documentation

## 2020-03-26 DIAGNOSIS — R0602 Shortness of breath: Secondary | ICD-10-CM | POA: Insufficient documentation

## 2020-03-26 DIAGNOSIS — I10 Essential (primary) hypertension: Secondary | ICD-10-CM | POA: Insufficient documentation

## 2020-03-26 LAB — URINALYSIS, COMPLETE (UACMP) WITH MICROSCOPIC
Bilirubin Urine: NEGATIVE
Glucose, UA: NEGATIVE mg/dL
Ketones, ur: NEGATIVE mg/dL
Nitrite: POSITIVE — AB
Protein, ur: 30 mg/dL — AB
Specific Gravity, Urine: 1.025 (ref 1.005–1.030)
WBC, UA: >50 WBC/hpf — ABNORMAL HIGH (ref 0–5)
pH: 6 (ref 5.0–8.0)

## 2020-03-26 LAB — CBC
HCT: 34.9 % — ABNORMAL LOW (ref 36.0–46.0)
Hemoglobin: 11.2 g/dL — ABNORMAL LOW (ref 12.0–15.0)
MCH: 27.7 pg (ref 26.0–34.0)
MCHC: 32.1 g/dL (ref 30.0–36.0)
MCV: 86.2 fL (ref 80.0–100.0)
Platelets: 358 10*3/uL (ref 150–400)
RBC: 4.05 MIL/uL (ref 3.87–5.11)
RDW: 19.2 % — ABNORMAL HIGH (ref 11.5–15.5)
WBC: 8 10*3/uL (ref 4.0–10.5)
nRBC: 0 % (ref 0.0–0.2)

## 2020-03-26 LAB — BASIC METABOLIC PANEL
Anion gap: 10 (ref 5–15)
BUN: 21 mg/dL — ABNORMAL HIGH (ref 6–20)
CO2: 24 mmol/L (ref 22–32)
Calcium: 8.7 mg/dL — ABNORMAL LOW (ref 8.9–10.3)
Chloride: 108 mmol/L (ref 98–111)
Creatinine, Ser: 1 mg/dL (ref 0.44–1.00)
GFR, Estimated: >60 mL/min (ref >60)
Glucose, Bld: 104 mg/dL — ABNORMAL HIGH (ref 70–99)
Potassium: 3.6 mmol/L (ref 3.5–5.1)
Sodium: 142 mmol/L (ref 135–145)

## 2020-03-26 LAB — POCT PREGNANCY, URINE: Preg Test, Ur: NEGATIVE

## 2020-03-26 LAB — TROPONIN I (HIGH SENSITIVITY): Troponin I (High Sensitivity): 3 ng/L (ref <18)

## 2020-03-26 MED ORDER — KETOROLAC TROMETHAMINE 30 MG/ML IJ SOLN
30.0000 mg | Freq: Once | INTRAMUSCULAR | Status: AC
  Administered 2020-03-26: 30 mg via INTRAMUSCULAR
  Filled 2020-03-26: qty 1

## 2020-03-26 MED ORDER — CYCLOBENZAPRINE HCL 5 MG PO TABS: 5.0000 mg | ORAL_TABLET | Freq: Three times a day (TID) | ORAL | 0 refills | Status: DC | PRN

## 2020-03-26 MED ORDER — CEPHALEXIN 500 MG PO CAPS
500.0000 mg | ORAL_CAPSULE | Freq: Once | ORAL | Status: DC
  Filled 2020-03-26: qty 1

## 2020-03-26 MED ORDER — CEPHALEXIN 500 MG PO CAPS: 500.0000 mg | ORAL_CAPSULE | Freq: Three times a day (TID) | ORAL | 0 refills | Status: AC

## 2020-03-26 MED ORDER — CEPHALEXIN 500 MG PO CAPS
500.0000 mg | ORAL_CAPSULE | Freq: Once | ORAL | Status: AC
  Administered 2020-03-26: 500 mg via ORAL

## 2020-03-26 NOTE — ED Provider Notes (Addendum)
[facility] Emergency Department Provider Note    First MD Initiated Contact with Patient 03/26/20 2209     (approximate)  I have reviewed the triage vital signs and the nursing notes.   HISTORY  Chief Complaint Back Pain and Shortness of Breath    HPI Tiffany [name] is a 34 y.o. female below listed past medical history presents to the ER for evaluation of pain between her shoulder blades.  This been going on for about 2 days.  Thinks that she might of pulled a muscle lifting her grandmother's chair.  Denies any trauma or significant other injury.  Denies any nausea or vomiting.  Denies any cough or congestion.  No fevers.  Does have some shortness of breath pain is worsened with movement.  Took some muscle relaxers and felt some improvement with the pain. Denies any palpitations or pleuritic pain. No wheezing.  No dysuria or flank pain   Past Medical History:  Diagnosis Date  . Addiction, marijuana (HCC)   . Alcohol abuse   . Chlamydia   . Cocaine addiction (HCC)   . Depression   . History of concussion   . Hypertension    does not take any meds   Family History  Problem Relation Age of Onset  . Hypertension Other   . Diabetes Other   . Heart failure Mother   . Hypertension Mother   . Cervical cancer Paternal Aunt    Past Surgical History:  Procedure Laterality Date  . WISDOM TOOTH EXTRACTION  2007   Patient Active Problem List   Diagnosis Date Noted  . Acute pyelonephritis 08/17/2019  . SIRS (systemic inflammatory response syndrome) (HCC) 08/17/2019  . Hypokalemia 08/17/2019  . Incomplete miscarriage 05/13/2012  . Polysubstance abuse (HCC) 10/21/2011  . Depression 10/21/2011      Prior to Admission medications   Medication Sig Start Date End Date Taking? Authorizing Provider  polyethylene glycol (MIRALAX / GLYCOLAX) 17 g packet Take 17 g by mouth daily as needed. Patient not taking: Reported on 11/25/2019 08/21/19   Darlin Drop,  DO    Allergies Patient has no known allergies.    Social History Social History   Tobacco Use  . Smoking status: Current Every Day Smoker    Packs/day: 1.00    Years: 9.00    Pack years: 9.00    Types: Cigarettes  . Smokeless tobacco: Never Used  Substance Use Topics  . Alcohol use: Yes    Alcohol/week: 13.0 standard drinks    Types: 12 Cans of beer, 1 Standard drinks or equivalent per week    Comment: daily  . Drug use: Yes    Frequency: 1.0 times per week    Types: Marijuana    Comment: cocaine in the past    Review of Systems Patient denies headaches, rhinorrhea, blurry vision, numbness, shortness of breath, chest pain, edema, cough, abdominal pain, nausea, vomiting, diarrhea, dysuria, fevers, rashes or hallucinations unless otherwise stated above in HPI. ____________________________________________   PHYSICAL EXAM:  VITAL SIGNS: Vitals:   03/26/20 2155 03/26/20 2200  BP:  116/68  Pulse:  69  Resp:  20  Temp:    SpO2: 100% 100%    Constitutional: Alert and oriented.  Eyes: Conjunctivae are normal.  Head: Atraumatic. Nose: No congestion/rhinnorhea. Mouth/Throat: Mucous membranes are moist.   Neck: No stridor. Painless ROM.  Cardiovascular: Normal rate, regular rhythm. Grossly normal heart sounds.  Good peripheral circulation. Respiratory: Normal respiratory effort.  No retractions. Lungs  CTAB. Gastrointestinal: Soft and nontender. No distention. No abdominal bruits. No CVA tenderness. Genitourinary:  Musculoskeletal: Pain reproduced with palpation between the shoulder blades.  No ecchymosis contusion or evidence of trauma .no lower extremity tenderness nor edema.  No joint effusions. Neurologic:  Normal speech and language. No gross focal neurologic deficits are appreciated. No facial droop Skin:  Skin is warm, dry and intact. No rash noted. Psychiatric: calm and cooperative  ____________________________________________   LABS (all labs ordered are  listed, but only abnormal results are displayed)  Results for orders placed or performed during the hospital encounter of 03/26/20 (from the past 24 hour(s))  Basic metabolic panel     Status: Abnormal   Collection Time: 03/26/20  7:36 PM  Result Value Ref Range   Sodium 142 135 - 145 mmol/L   Potassium 3.6 3.5 - 5.1 mmol/L   Chloride 108 98 - 111 mmol/L   CO2 24 22 - 32 mmol/L   Glucose, Bld 104 (H) 70 - 99 mg/dL   BUN 21 (H) 6 - 20 mg/dL   Creatinine, Ser 1.61 0.44 - 1.00 mg/dL   Calcium 8.7 (L) 8.9 - 10.3 mg/dL   GFR, Estimated >09 >60 mL/min   Anion gap 10 5 - 15  CBC     Status: Abnormal   Collection Time: 03/26/20  7:36 PM  Result Value Ref Range   WBC 8.0 4.0 - 10.5 K/uL   RBC 4.05 3.87 - 5.11 MIL/uL   Hemoglobin 11.2 (L) 12.0 - 15.0 g/dL   HCT 45.4 (L) 36 - 46 %   MCV 86.2 80.0 - 100.0 fL   MCH 27.7 26.0 - 34.0 pg   MCHC 32.1 30.0 - 36.0 g/dL   RDW 09.8 (H) 11.9 - 14.7 %   Platelets 358 150 - 400 K/uL   nRBC 0.0 0.0 - 0.2 %  Troponin I (High Sensitivity)     Status: None   Collection Time: 03/26/20  7:36 PM  Result Value Ref Range   Troponin I (High Sensitivity) 3 <18 ng/L  Pregnancy, urine POC     Status: None   Collection Time: 03/26/20  8:06 PM  Result Value Ref Range   Preg Test, Ur NEGATIVE NEGATIVE   ____________________________________________  EKG My review and personal interpretation at Time: 19:46   Indication: back pain  Rate: 60  Rhythm: sinus Axis: normal Other: no stemi, no depression, pacs ____________________________________________  RADIOLOGY  I personally reviewed all radiographic images ordered to evaluate for the above acute complaints and reviewed radiology reports and findings.  These findings were personally discussed with the patient.  Please see medical record for radiology report.  ____________________________________________   PROCEDURES  Procedure(s) performed:  Procedures    Critical Care performed:  no ____________________________________________   INITIAL IMPRESSION / ASSESSMENT AND PLAN / ED COURSE  Pertinent labs & imaging results that were available during my care of the patient were reviewed by me and considered in my medical decision making (see chart for details).   DDX: ACS, pericarditis, esophagitis, boerhaaves, pe, dissection, pna, bronchitis, costochondritis   Tiffany [name] is a 34 y.o. who presents to the ED with presentation as described above it seems most clinically consistent with musculoskeletal strain.  EKG is nonischemic.  Troponin negative.  Given 2 days of pain is not consistent with ACS or dissection.  She is low risk by Wells criteria and is PERC negative.  Abdominal exam soft and benign.  Pain is reproducible and worse with movement.  No sign of pneumonia.    States she has had some increased urinary frequency. She is without white count or fever. Will cover with antibiotics for UTI.Marland Kitchen  Patient stable appropriate for outpatient follow-up.     The patient was evaluated in Emergency Department today for the symptoms described in the history of present illness. He/she was evaluated in the context of the global COVID-19 pandemic, which necessitated consideration that the patient might be at risk for infection with the SARS-CoV-2 virus that causes COVID-19. Institutional protocols and algorithms that pertain to the evaluation of patients at risk for COVID-19 are in a state of rapid change based on information released by regulatory bodies including the CDC and federal and state organizations. These policies and algorithms were followed during the patient's care in the ED.  As part of my medical decision making, I reviewed the following data within the electronic MEDICAL RECORD NUMBER Nursing notes reviewed and incorporated, Labs reviewed, notes from prior ED visits and [place] Controlled Substance Database   ____________________________________________   FINAL CLINICAL  IMPRESSION(S) / ED DIAGNOSES  Final diagnoses:  Upper back pain      NEW MEDICATIONS STARTED DURING THIS VISIT:  New Prescriptions   No medications on file     Note:  This document was prepared using Dragon voice recognition software and may include unintentional dictation errors.    Willy Eddy, MD 03/26/20 2240    Willy Eddy, MD 03/26/20 2300

## 2020-03-26 NOTE — ED Triage Notes (Signed)
Patient with complaint of upper back pain between her shoulder blades times three days. Patient states that she took a muscle relaxer and it helped for a short time. Patient also states that she has some shortness of breath that started today. Denies nausea and vomiting.

## 2020-03-26 NOTE — ED Notes (Signed)
Pt provided with sandwich tray and ginger ale per request 

## 2020-03-26 NOTE — ED Notes (Signed)
Pt lying in bed NAD noted. Pt complains of constant back pain since Thursday. Pt rates pain 9/10. Pain described as sharp. When asked about potential injuries, pt states she lifted a chair recently that could potentially have caused some pain in her back. Pt states pain is in upper middle part of back. Pain worsens with movement. Only effective pain relief has been muscle relaxer per pt. Pt complains of occasional shortness of breath when pain is increased. Denies nausea and vomiting. Pt connected to monitor. Pt request for sandwich tray and drink. Informed she would need to be seen by provider prior.

## 2020-03-26 NOTE — ED Notes (Signed)
MD at bedside. 

## 2020-03-27 ENCOUNTER — Emergency Department
Admission: EM | Admit: 2020-03-27 | Discharge: 2020-03-27 | Disposition: A | Discharge status: Home / Self Care | Payer: Medicaid Other | Attending: Emergency Medicine | Admitting: Emergency Medicine

## 2020-03-27 DIAGNOSIS — M546 Pain in thoracic spine: Secondary | ICD-10-CM | POA: Insufficient documentation

## 2020-03-27 DIAGNOSIS — I10 Essential (primary) hypertension: Secondary | ICD-10-CM | POA: Insufficient documentation

## 2020-03-27 DIAGNOSIS — M549 Dorsalgia, unspecified: Secondary | ICD-10-CM

## 2020-03-27 DIAGNOSIS — F17210 Nicotine dependence, cigarettes, uncomplicated: Secondary | ICD-10-CM | POA: Insufficient documentation

## 2020-03-27 MED ORDER — NAPROXEN 500 MG PO TABS
500.0000 mg | ORAL_TABLET | Freq: Once | ORAL | Status: AC
  Administered 2020-03-27: 500 mg via ORAL
  Filled 2020-03-27: qty 1

## 2020-03-27 MED ORDER — NAPROXEN 500 MG PO TABS: 500.0000 mg | ORAL_TABLET | Freq: Two times a day (BID) | ORAL | 2 refills | Status: DC

## 2020-03-27 NOTE — ED Notes (Signed)
Patient continues to sleep soundly. Will medicate when more alert and explain discharge papers to her.

## 2020-03-27 NOTE — ED Notes (Signed)
MD evaluation at this time

## 2020-03-27 NOTE — ED Notes (Signed)
Patient states she isn't ready to be discharged yet. She is "too scared she will start hurting again." Explained to patient she is stable enough for discharge but will allow her to get more coherent before she leaves.

## 2020-03-27 NOTE — ED Triage Notes (Signed)
Pt in via EMS from home with c/o back pain. Pt here last pm for the same and given keflex and a Toradol shot. Pt reports meds did not help and now has pain in other places. Pt was given 5mg  haldol IM in left arm due to being combative. Pt last used heroin and cocaine 3 days ago.  Pt also reports some CP but refused aspirin. Pt also reports some burning and smell to her urine

## 2020-03-27 NOTE — ED Provider Notes (Signed)
[facility] Emergency Department Provider Note   ____________________________________________    I have reviewed the triage vital signs and the nursing notes.   HISTORY  Chief Complaint Back Pain     HPI Tiffany [name] is a 34 y.o. female presents with complaints of back pain.  Apparently the patient was given IM Haldol by EMS because she was combative and is therefore having difficulty staying awake currently.  She reports continued back pain, primarily upper back pain.  She denies IV drug abuse although she does have a history of drug use.  No fevers or chills.  No numbness and tingling in the lower extremities, no saddle anesthesia or urinary incontinence.  Was seen recently given IM Toradol with minimal improvement.  Past Medical History:  Diagnosis Date  . Addiction, marijuana (HCC)   . Alcohol abuse   . Chlamydia   . Cocaine addiction (HCC)   . Depression   . History of concussion   . Hypertension    does not take any meds    Patient Active Problem List   Diagnosis Date Noted  . Acute pyelonephritis 08/17/2019  . SIRS (systemic inflammatory response syndrome) (HCC) 08/17/2019  . Hypokalemia 08/17/2019  . Incomplete miscarriage 05/13/2012  . Polysubstance abuse (HCC) 10/21/2011  . Depression 10/21/2011    Past Surgical History:  Procedure Laterality Date  . WISDOM TOOTH EXTRACTION  2007    Prior to Admission medications   Medication Sig Start Date End Date Taking? Authorizing Provider  cephALEXin (KEFLEX) 500 MG capsule Take 1 capsule (500 mg total) by mouth 3 (three) times daily for 7 days. 03/26/20 04/02/20  Willy Eddy, MD  cyclobenzaprine (FLEXERIL) 5 MG tablet Take 1 tablet (5 mg total) by mouth 3 (three) times daily as needed for muscle spasms. 03/26/20   Willy Eddy, MD  naproxen (NAPROSYN) 500 MG tablet Take 1 tablet (500 mg total) by mouth 2 (two) times daily with a meal. 03/27/20   Jene Every, MD    polyethylene glycol (MIRALAX / GLYCOLAX) 17 g packet Take 17 g by mouth daily as needed. Patient not taking: Reported on 11/25/2019 08/21/19   Darlin Drop, DO     Allergies Patient has no known allergies.  Family History  Problem Relation Age of Onset  . Hypertension Other   . Diabetes Other   . Heart failure Mother   . Hypertension Mother   . Cervical cancer Paternal Aunt     Social History Social History   Tobacco Use  . Smoking status: Current Every Day Smoker    Packs/day: 1.00    Years: 9.00    Pack years: 9.00    Types: Cigarettes  . Smokeless tobacco: Never Used  Substance Use Topics  . Alcohol use: Yes    Alcohol/week: 13.0 standard drinks    Types: 12 Cans of beer, 1 Standard drinks or equivalent per week    Comment: daily  . Drug use: Yes    Frequency: 1.0 times per week    Types: Marijuana    Comment: cocaine in the past    Review of Systems  Constitutional: No fever/chills Eyes: No visual changes.  ENT: No sore throat. Cardiovascular: Denies chest pain. Respiratory: Denies shortness of breath. Gastrointestinal: No abdominal pain.     Genitourinary: Negative for dysuria. Musculoskeletal: As above Skin: Negative for rash. Neurological: Negative for headaches   ____________________________________________   PHYSICAL EXAM:  VITAL SIGNS: ED Triage Vitals  Enc Vitals Group  BP 03/27/20 1013 137/79     Pulse Rate 03/27/20 1013 74     Resp 03/27/20 1013 18     Temp --      Temp src --      SpO2 03/27/20 1013 100 %     Weight --      Height --      Head Circumference --      Peak Flow --      Pain Score 03/27/20 1010 10     Pain Loc --      Pain Edu? --      Excl. in GC? --     Constitutional: Alert and oriented. No acute distress.  Temperature 98.6, checked by me Eyes: Conjunctivae are normal.  PERRLA, EOMI Head: Atraumatic. Nose: No congestion/rhinnorhea. Mouth/Throat: Mucous membranes are moist.   Neck:  Painless  ROM Cardiovascular: Normal rate, regular rhythm. Grossly normal heart sounds.  Good peripheral circulation.  Musculoskeletal: Point tenderness topalpation of muscle just lateral to thoracic spine at approximately T6.  No vertebral tenderness palpation.  Warm and well perfused Neurologic:  Normal speech and language. No gross focal neurologic deficits are appreciated.  Normal strength in the lower extremities, no changes in sensation.  Normal ambulation. Skin:  Skin is warm, dry and intact.  Psychiatric: Mood and affect are normal. Speech and behavior are normal.  ____________________________________________   LABS (all labs ordered are listed, but only abnormal results are displayed)  Labs Reviewed - No data to display ____________________________________________  EKG  None ____________________________________________  RADIOLOGY  None ____________________________________________   PROCEDURES  Procedure(s) performed: No  Procedures   Critical Care performed: No ____________________________________________   INITIAL IMPRESSION / ASSESSMENT AND PLAN / ED COURSE  Pertinent labs & imaging results that were available during my care of the patient were reviewed by me and considered in my medical decision making (see chart for details).  Patient presents for upper back pain as described above, required sedating medications apparently per EMS because of combative behavior.  Exam here is reassuring, no IV drug abuse, no fevers, afebrile in emergency department, normal vitals, able to ambulate well.  Normal strength in lower extremities.  No saddle anesthesia.  Exam is most consistent with muscle strain as noted above, recommend continued supportive care no indication for advanced imaging at this time.  Outpatient follow-up recommended    ____________________________________________   FINAL CLINICAL IMPRESSION(S) / ED DIAGNOSES  Final diagnoses:  Upper back pain         Note:  This document was prepared using Dragon voice recognition software and may include unintentional dictation errors.   Jene Every, MD 03/27/20 1452

## 2020-03-27 NOTE — ED Notes (Signed)
Patient is somnolent during MD evaluation. Received IM Haldol with EMS.

## 2020-03-27 NOTE — ED Notes (Signed)
See notes from yesterday. Patient is currently resting quietly on stretcher.

## 2020-07-22 ENCOUNTER — Other Ambulatory Visit: Payer: Self-pay

## 2020-07-22 ENCOUNTER — Encounter (HOSPITAL_COMMUNITY): Payer: Self-pay

## 2020-07-22 ENCOUNTER — Ambulatory Visit (HOSPITAL_COMMUNITY)
Admission: EM | Admit: 2020-07-22 | Discharge: 2020-07-22 | Disposition: A | Discharge status: Home / Self Care | Payer: Self-pay | Attending: Medical Oncology | Admitting: Medical Oncology

## 2020-07-22 DIAGNOSIS — F17210 Nicotine dependence, cigarettes, uncomplicated: Secondary | ICD-10-CM | POA: Insufficient documentation

## 2020-07-22 DIAGNOSIS — Z3202 Encounter for pregnancy test, result negative: Secondary | ICD-10-CM

## 2020-07-22 DIAGNOSIS — N898 Other specified noninflammatory disorders of vagina: Secondary | ICD-10-CM | POA: Insufficient documentation

## 2020-07-22 DIAGNOSIS — S46812A Strain of other muscles, fascia and tendons at shoulder and upper arm level, left arm, initial encounter: Secondary | ICD-10-CM | POA: Insufficient documentation

## 2020-07-22 LAB — POCT URINALYSIS DIPSTICK, ED / UC
Bilirubin Urine: NEGATIVE
Glucose, UA: NEGATIVE mg/dL
Hgb urine dipstick: NEGATIVE
Ketones, ur: NEGATIVE mg/dL
Leukocytes,Ua: NEGATIVE
Nitrite: NEGATIVE
Protein, ur: NEGATIVE mg/dL
Specific Gravity, Urine: 1.025 (ref 1.005–1.030)
Urobilinogen, UA: 0.2 mg/dL (ref 0.0–1.0)
pH: 6 (ref 5.0–8.0)

## 2020-07-22 LAB — POC URINE PREG, ED: Preg Test, Ur: NEGATIVE

## 2020-07-22 MED ORDER — CYCLOBENZAPRINE HCL 10 MG PO TABS: 10.0000 mg | ORAL_TABLET | Freq: Two times a day (BID) | ORAL | 0 refills | Status: DC | PRN
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
## 2020-07-25 ENCOUNTER — Telehealth (HOSPITAL_COMMUNITY): Payer: Self-pay | Admitting: Emergency Medicine

## 2020-07-25 LAB — CERVICOVAGINAL ANCILLARY ONLY
Bacterial Vaginitis (gardnerella): POSITIVE — AB
Candida Glabrata: NEGATIVE
Candida Vaginitis: POSITIVE — AB
Chlamydia: NEGATIVE
Comment: NEGATIVE
Comment: NEGATIVE
Comment: NEGATIVE
Comment: NEGATIVE
Comment: NEGATIVE
Comment: NORMAL
Neisseria Gonorrhea: NEGATIVE
Trichomonas: POSITIVE — AB

## 2020-07-25 MED ORDER — FLUCONAZOLE 150 MG PO TABS: 150.0000 mg | ORAL_TABLET | Freq: Once | ORAL | 0 refills | Status: AC

## 2020-07-25 MED ORDER — METRONIDAZOLE 500 MG PO TABS: 500.0000 mg | ORAL_TABLET | Freq: Two times a day (BID) | ORAL | 0 refills | Status: DC

## 2020-12-09 IMAGING — CT CT ABD-PELV W/ CM
2 of 4 series · 15 of 46 positions shown, 17 images · IV contrast (APPLIED)
Comparison: CT 07/04/2017

CLINICAL DATA: Acute nonlocalized abdominal pain.

EXAM:
CT ABDOMEN AND PELVIS WITH CONTRAST
TECHNIQUE: Multidetector CT imaging of the abdomen and pelvis was performed
using the standard protocol following bolus administration of
intravenous contrast.
CONTRAST:  100mL OMNIPAQUE IOHEXOL 300 MG/ML  SOLN

[Series 2: axial st · axial · 0.71mm/px · z∈[-471,-111]mm · 12 of 82 slices shown, 14 images]
[im 5/82  soft-tissue]
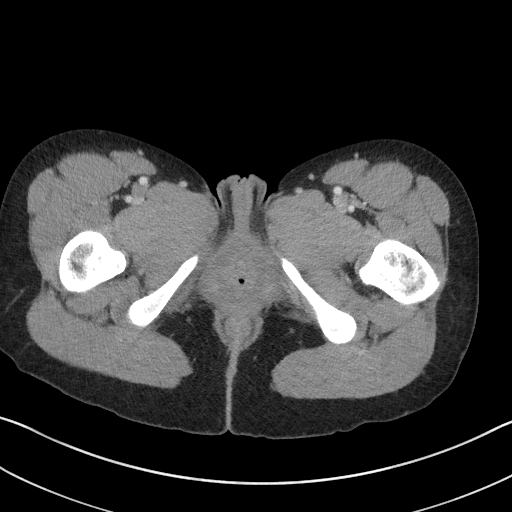
[im 5/82  bone]
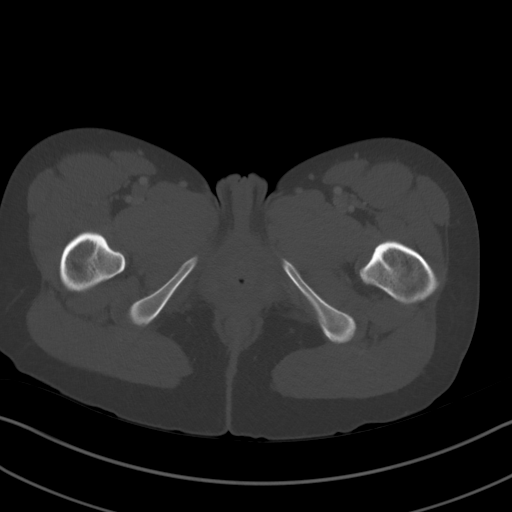
[im 14/82  soft-tissue]
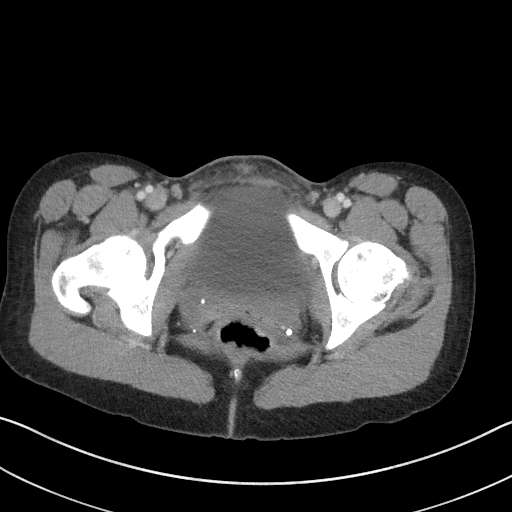
[im 19/82  soft-tissue]
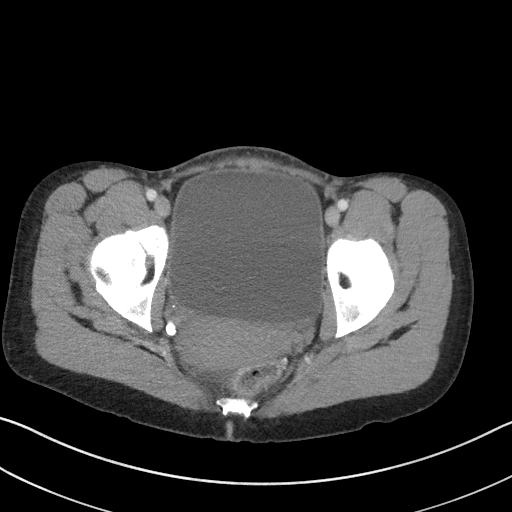
[im 23/82  soft-tissue]
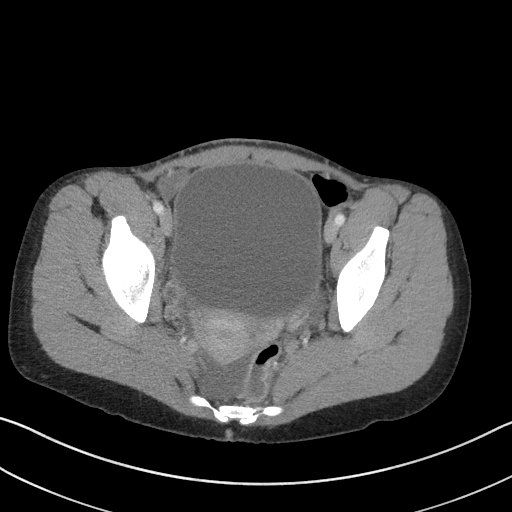
[im 32/82  soft-tissue]
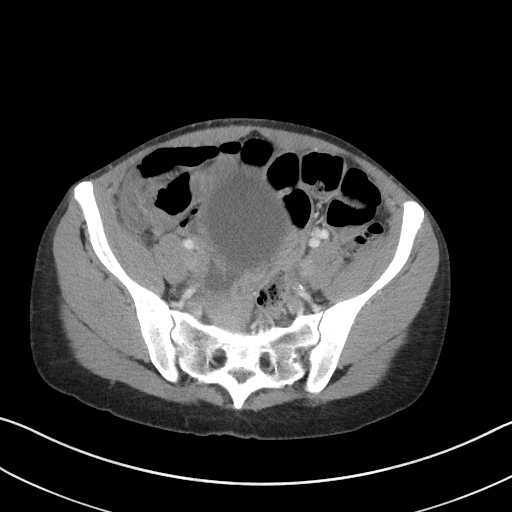
[im 37/82  soft-tissue]
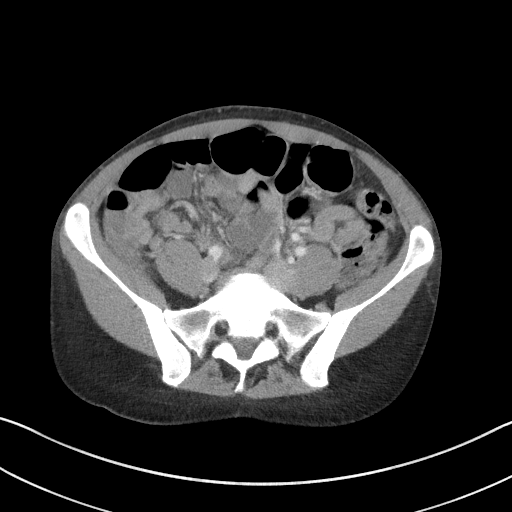
[im 46/82  soft-tissue]
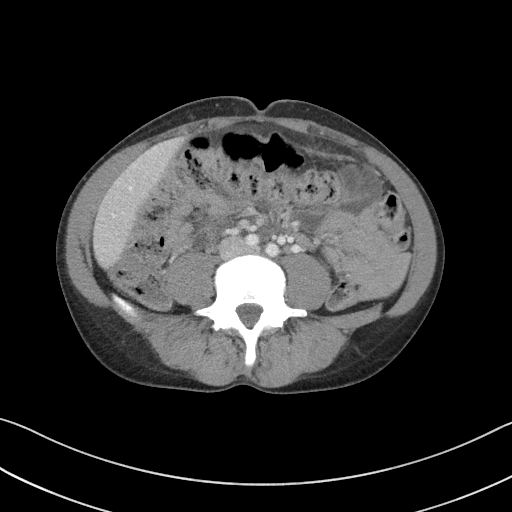
[im 50/82  soft-tissue]
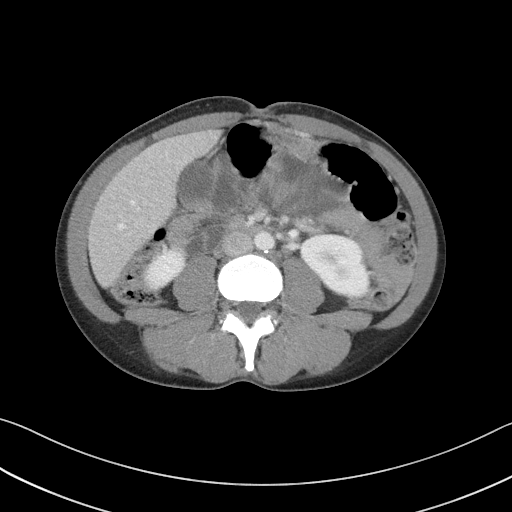
[im 59/82  soft-tissue]
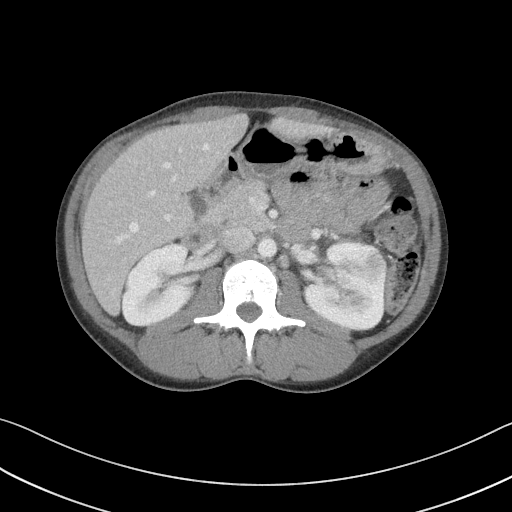
[im 59/82  bone]
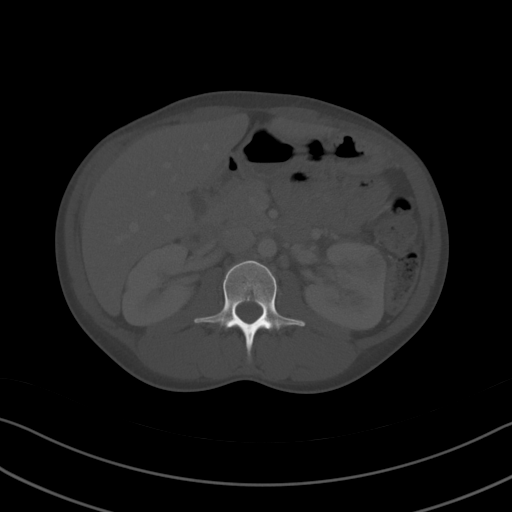
[im 64/82  soft-tissue]
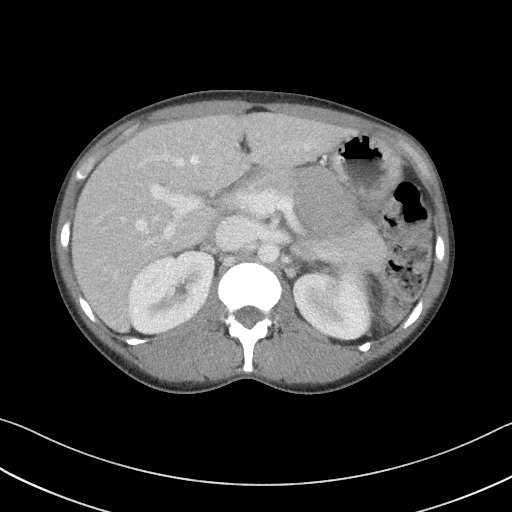
[im 68/82  soft-tissue]
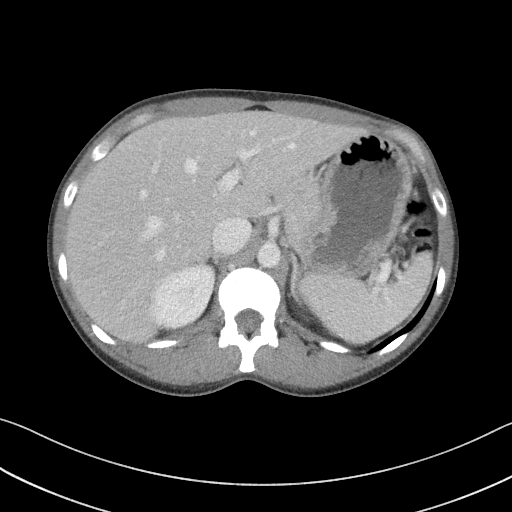
[im 77/82  soft-tissue]
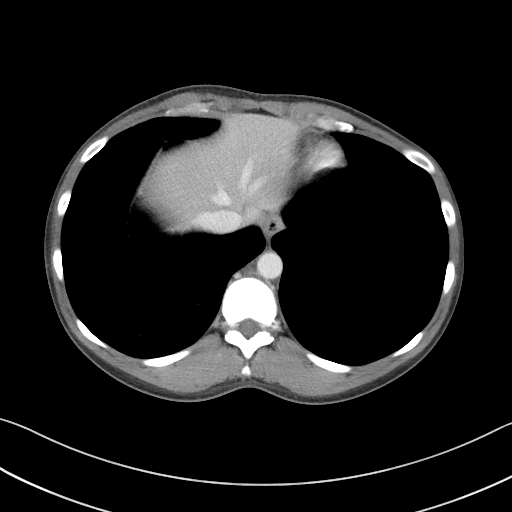

[Series 5: coronal st · coronal · 0.68mm/px · 3 of 83 slices shown]
[im 28/83  soft-tissue]
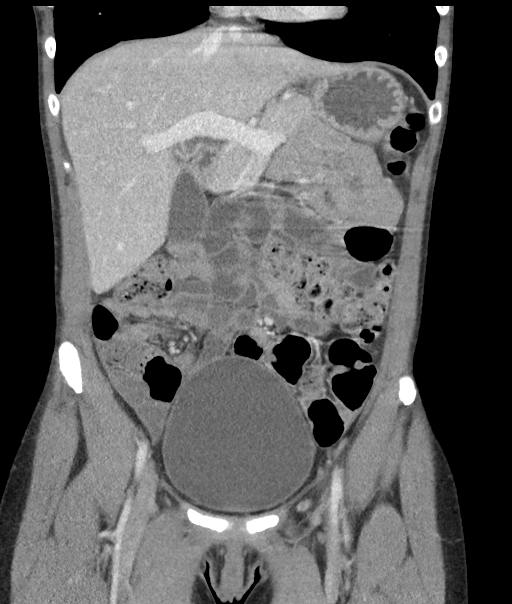
[im 37/83  soft-tissue]
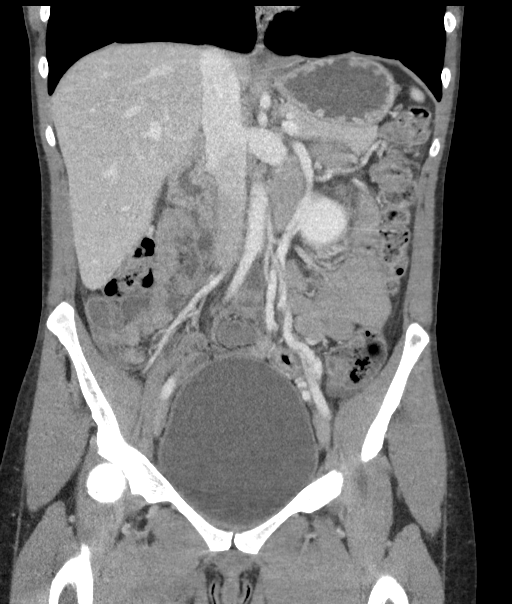
[im 46/83  soft-tissue]
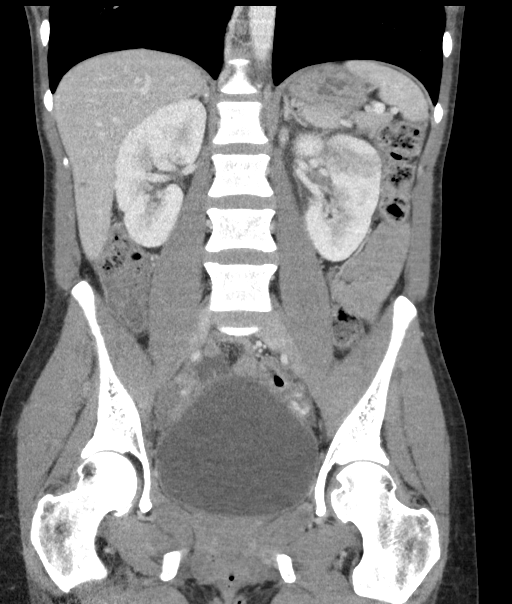

[15 of 46 positions shown; findings below may reference images not displayed]

FINDINGS: Lower chest: Accessory fissure at the right lung base. No acute
findings. No focal airspace disease or pleural fluid.

Hepatobiliary: There tiny scattered hepatic hypodensities that are
too small to accurately characterize. Gallbladder physiologically
distended, no calcified stone. No biliary dilatation.

Pancreas: No ductal dilatation or inflammation.

Spleen: Normal in size without focal abnormality.

Adrenals/Urinary Tract: Normal adrenal glands. Heterogeneous left
renal enhancement. Ill-defined cortical hypodensity, series 2, image
23. mild left perinephric edema. No hydronephrosis. There is mild
left ureteral enhancement. Homogeneous right renal enhancement. No
focal right renal abnormality. Right ureter decompressed. Distended
urinary bladder without wall thickening.

Stomach/Bowel: Bowel evaluation is limited in the absence of enteric
contrast and paucity of intra-abdominal fat. Stomach partially
distended. Normal positioning of the ligament of Treitz. Central
small bowel loops are fluid-filled but nondilated. There is no
evidence of obstruction. No definite small bowel inflammation or
wall thickening. Normal air-filled appendix in the right lower
quadrant. Small volume of colonic stool. Sigmoid colon is redundant.
No colonic wall thickening.

Vascular/Lymphatic: Mild aortic atherosclerosis, age advanced.
Portal vein is patent. Mesenteric vessels are grossly patent.
Prominent adnexal and periuterine vascularity with dilatation of the
left ovarian vein 6 mm with reflux of contrast. Few prominent left
retroperitoneal nodes are nonspecific but likely reactive.

Reproductive: Probable corpus luteal cyst in the right ovary. Uterus
and left adnexa are unremarkable. There is prominent periuterine and
adnexal vascularity.

Other: Small amount of free fluid in the pelvis. No free air.

Musculoskeletal: There are no acute or suspicious osseous
abnormalities.
IMPRESSION: 1. Findings consistent with left pyelonephritis. Ill-defined
low-density in the left upper kidney concerning for phlegmon
formation, no drainable fluid collection/abscess.
2. Prominent adnexal and periuterine vascularity with dilatation of
the left ovarian vein, can be seen with pelvic congestion syndrome.
3. Few prominent fluid-filled loops of small bowel in the central
abdomen, likely reactive.
4. Aortic atherosclerosis, mild but age advanced. Aortic
Atherosclerosis (Z99M9-9I5.5).

Aortic Atherosclerosis (Z99M9-9I5.5).

## 2021-01-27 ENCOUNTER — Emergency Department (HOSPITAL_COMMUNITY)
Admission: EM | Admit: 2021-01-27 | Discharge: 2021-01-27 | Disposition: A | Discharge status: Home / Self Care | Payer: Self-pay | Attending: Emergency Medicine | Admitting: Emergency Medicine

## 2021-01-27 ENCOUNTER — Encounter (HOSPITAL_COMMUNITY): Payer: Self-pay

## 2021-01-27 ENCOUNTER — Emergency Department (HOSPITAL_COMMUNITY): Payer: Self-pay

## 2021-01-27 DIAGNOSIS — U071 COVID-19: Secondary | ICD-10-CM | POA: Insufficient documentation

## 2021-01-27 DIAGNOSIS — Z28310 Unvaccinated for covid-19: Secondary | ICD-10-CM | POA: Insufficient documentation

## 2021-01-27 DIAGNOSIS — I10 Essential (primary) hypertension: Secondary | ICD-10-CM | POA: Insufficient documentation

## 2021-01-27 DIAGNOSIS — R809 Proteinuria, unspecified: Secondary | ICD-10-CM | POA: Insufficient documentation

## 2021-01-27 DIAGNOSIS — F17210 Nicotine dependence, cigarettes, uncomplicated: Secondary | ICD-10-CM | POA: Insufficient documentation

## 2021-01-27 DIAGNOSIS — R52 Pain, unspecified: Secondary | ICD-10-CM

## 2021-01-27 LAB — URINALYSIS, ROUTINE W REFLEX MICROSCOPIC
Bilirubin Urine: NEGATIVE
Glucose, UA: NEGATIVE mg/dL
Hgb urine dipstick: NEGATIVE
Ketones, ur: 20 mg/dL — AB
Leukocytes,Ua: NEGATIVE
Nitrite: NEGATIVE
Protein, ur: >=300 mg/dL — AB
Specific Gravity, Urine: 1.022 (ref 1.005–1.030)
pH: 5 (ref 5.0–8.0)

## 2021-01-27 LAB — CBC WITH DIFFERENTIAL/PLATELET
Abs Immature Granulocytes: 0.01 10*3/uL (ref 0.00–0.07)
Basophils Absolute: 0 10*3/uL (ref 0.0–0.1)
Basophils Relative: 1 %
Eosinophils Absolute: 0 10*3/uL (ref 0.0–0.5)
Eosinophils Relative: 0 %
HCT: 47.9 % — ABNORMAL HIGH (ref 36.0–46.0)
Hemoglobin: 15.3 g/dL — ABNORMAL HIGH (ref 12.0–15.0)
Immature Granulocytes: 0 %
Lymphocytes Relative: 20 %
Lymphs Abs: 1.1 10*3/uL (ref 0.7–4.0)
MCH: 27.8 pg (ref 26.0–34.0)
MCHC: 31.9 g/dL (ref 30.0–36.0)
MCV: 86.9 fL (ref 80.0–100.0)
Monocytes Absolute: 0.8 10*3/uL (ref 0.1–1.0)
Monocytes Relative: 15 %
Neutro Abs: 3.5 10*3/uL (ref 1.7–7.7)
Neutrophils Relative %: 64 %
Platelets: 313 10*3/uL (ref 150–400)
RBC: 5.51 MIL/uL — ABNORMAL HIGH (ref 3.87–5.11)
RDW: 17.6 % — ABNORMAL HIGH (ref 11.5–15.5)
WBC: 5.4 10*3/uL (ref 4.0–10.5)
nRBC: 0 % (ref 0.0–0.2)

## 2021-01-27 LAB — COMPREHENSIVE METABOLIC PANEL
ALT: 21 U/L (ref 0–44)
AST: 40 U/L (ref 15–41)
Albumin: 4.3 g/dL (ref 3.5–5.0)
Alkaline Phosphatase: 63 U/L (ref 38–126)
Anion gap: 11 (ref 5–15)
BUN: 11 mg/dL (ref 6–20)
CO2: 23 mmol/L (ref 22–32)
Calcium: 8.7 mg/dL — ABNORMAL LOW (ref 8.9–10.3)
Chloride: 98 mmol/L (ref 98–111)
Creatinine, Ser: 1.07 mg/dL — ABNORMAL HIGH (ref 0.44–1.00)
GFR, Estimated: >60 mL/min (ref >60)
Glucose, Bld: 95 mg/dL (ref 70–99)
Potassium: 3.5 mmol/L (ref 3.5–5.1)
Sodium: 132 mmol/L — ABNORMAL LOW (ref 135–145)
Total Bilirubin: 0.4 mg/dL (ref 0.3–1.2)
Total Protein: 8 g/dL (ref 6.5–8.1)

## 2021-01-27 LAB — RESP PANEL BY RT-PCR (FLU A&B, COVID) ARPGX2
Influenza A by PCR: NEGATIVE
Influenza B by PCR: NEGATIVE
SARS Coronavirus 2 by RT PCR: POSITIVE — AB

## 2021-01-27 LAB — POC URINE PREG, ED: Preg Test, Ur: NEGATIVE

## 2021-01-27 LAB — LIPASE, BLOOD: Lipase: 45 U/L (ref 11–51)

## 2021-01-27 MED ORDER — ACETAMINOPHEN 325 MG PO TABS
650.0000 mg | ORAL_TABLET | Freq: Once | ORAL | Status: AC
  Administered 2021-01-27: 650 mg via ORAL
  Filled 2021-01-27: qty 2

## 2021-01-27 MED ORDER — SODIUM CHLORIDE 0.9 % IV BOLUS
1000.0000 mL | Freq: Once | INTRAVENOUS | Status: AC
  Administered 2021-01-27: 1000 mL via INTRAVENOUS

## 2021-01-27 NOTE — ED Notes (Signed)
PA at bedside assessing pt.

## 2021-01-27 NOTE — ED Notes (Signed)
Patient ambulated independently with steady gait, Lynnsey RN made aware.

## 2021-01-27 NOTE — ED Triage Notes (Addendum)
Pt presents from home via EMS with c/o fever and headache for 3 days. Unknown if pt was covid-exposed. Pt also c/o lower flank pain/back pain, hx of UTI.

## 2021-01-27 NOTE — ED Notes (Signed)
Pt ambulated to bathroom with one staff assist.

## 2021-01-27 NOTE — Discharge Instructions (Addendum)
At this time there does not appear to be the presence of an emergent medical condition, however there is always the potential for conditions to change. Please read and follow the below instructions.  Please return to the Emergency Department immediately for any new or worsening symptoms. Please be sure to follow up with your Primary Care Provider within one week regarding your visit today; please call their office to schedule an appointment even if you are feeling better for a follow-up visit.  If you do not have a primary care provider you may call [place] to establish 1. Please drink plenty of water and get plenty of rest to help with your symptoms.  Please avoid close contact with others to help avoid spread of COVID-19. Please take Ibuprofen (Advil, motrin) and Tylenol (acetaminophen) to relieve your pain.  You may take up to 400 MG (2 pills) of normal strength ibuprofen every 8 hours as needed.  You make take tylenol, up to 500 mg (one extra strength pill) every 8 hours as needed. It is safe to take ibuprofen and tylenol at the same time as they work differently.  Do not take more than 3,000 mg tylenol in a 24 hour period (not more than one dose every 8 hours.  Please check all medication labels as many medications such as pain and cold medications may contain tylenol.  Do not drink alcohol while taking these medications.  Do not take other NSAID'S while taking ibuprofen (such as aleve or naproxen).  Please take ibuprofen with food to decrease stomach upset. Your urine sample today showed protein.  Please have your urine rechecked by your primary care provider to ensure that this improves.  Go to the nearest Emergency Department immediately if: You have fever or chills Trouble breathing Persistent pain or pressure in the chest New confusion Inability to wake or stay awake Pale, gray, or blue-colored skin, lips, or nail beds, depending on skin tone You have any  new/concerning or worsening of symptoms.  Please read the additional information packets attached to your discharge summary.  Do not take your medicine if  develop an itchy rash, swelling in your mouth or lips, or difficulty breathing; call 911 and seek immediate emergency medical attention if this occurs.  You may review your lab tests and imaging results in their entirety on your MyChart account.  Please discuss all results of fully with your primary care provider and other specialist at your follow-up visit.  Note: Portions of this text may have been transcribed using voice recognition software. Every effort was made to ensure accuracy; however, inadvertent computerized transcription errors may still be present.

## 2021-01-27 NOTE — ED Provider Notes (Signed)
[facility]-EMERGENCY DEPT Provider Note   CSN: [id number] Arrival date & time: 01/27/21  0741     History Chief Complaint  Patient presents with   Fever   Headache    Tiffany [name] is a 35 y.o. female history includes polysubstance abuse, GC/chlamydia, depression.  Patient presents to the ER for low back pain headache onset 3 days ago no known inciting event.  Patient reports generalized body aches mostly of her lower back is aching constant moderate-severe intensity nonradiating no clear aggravating or alleviating factors.  No medication prior to arrival.  Headache is aching as well constant no clear aggravating factors.  Patient reports EMS told her she had a fever she was unaware fever prior to that time.  Denies fall/injury, vision changes, neck stiffness, abdominal pain, nausea/vomiting, dysuria/hematuria, vaginal discharge or any additional concerns.  Of note patient reports that she has not received her COVID-19 vaccines but has never had COVID before HPI     Past Medical History:  Diagnosis Date   Addiction, marijuana (HCC)    Alcohol abuse    Chlamydia    Cocaine addiction (HCC)    Depression    History of concussion    Hypertension    does not take any meds    Patient Active Problem List   Diagnosis Date Noted   Acute pyelonephritis 08/17/2019   SIRS (systemic inflammatory response syndrome) (HCC) 08/17/2019   Hypokalemia 08/17/2019   Incomplete miscarriage 05/13/2012   Polysubstance abuse (HCC) 10/21/2011   Depression 10/21/2011    Past Surgical History:  Procedure Laterality Date   WISDOM TOOTH EXTRACTION  2007     OB History     Gravida  3   Para  2   Term  1   Preterm      AB  1   Living  1      SAB  1   IAB      Ectopic      Multiple      Live Births  1           Family History  Problem Relation Age of Onset   Hypertension Other    Diabetes Other    Heart failure Mother    Hypertension  Mother    Cervical cancer Paternal Aunt     Social History   Tobacco Use   Smoking status: Every Day    Packs/day: 1.00    Years: 9.00    Pack years: 9.00    Types: Cigarettes   Smokeless tobacco: Never  Substance Use Topics   Alcohol use: Yes    Alcohol/week: 13.0 standard drinks    Types: 12 Cans of beer, 1 Standard drinks or equivalent per week    Comment: daily   Drug use: Yes    Frequency: 1.0 times per week    Types: Marijuana, Cocaine    Comment: cocaine use currently    Home Medications Prior to Admission medications   Medication Sig Start Date End Date Taking? Authorizing Provider  ibuprofen (ADVIL) 200 MG tablet Take 400 mg by mouth every 6 (six) hours as needed for headache or mild pain.   Yes [provider]    Allergies    Shellfish allergy, Pollen extract, and Aspirin  Review of Systems   Review of Systems Ten systems are reviewed and are negative for acute change except as noted in the HPI  Physical Exam Updated Vital Signs BP 104/71   Pulse 61  Temp 98.7 F (37.1 C) (Oral)   Resp (!) 21   Ht 5\' 6"  (1.676 m)   Wt 59 kg   LMP  (LMP Unknown)   SpO2 98%   BMI 20.98 kg/m   Physical Exam Constitutional:      General: She is not in acute distress.    Appearance: Normal appearance. She is well-developed. She is not ill-appearing or diaphoretic.  HENT:     Head: Normocephalic and atraumatic.  Eyes:     General: Vision grossly intact. Gaze aligned appropriately.     Pupils: Pupils are equal, round, and reactive to light.  Neck:     Trachea: Trachea and phonation normal.     Meningeal: Brudzinski's sign absent.  Cardiovascular:     Rate and Rhythm: Normal rate and regular rhythm.  Pulmonary:     Effort: Pulmonary effort is normal. No respiratory distress.     Breath sounds: Normal breath sounds.  Abdominal:     General: There is no distension.     Palpations: Abdomen is soft.     Tenderness: There is no abdominal tenderness. There  is no guarding or rebound.  Musculoskeletal:        General: Normal range of motion.     Cervical back: Full passive range of motion without pain, normal range of motion and neck supple.     Comments: No midline spinal tenderness.  No crepitus step-off or deformity of the spine.  Patient moves all 4 extremities spontaneously.  Patient is able to pull to a seated position.  Skin:    General: Skin is warm and dry.  Neurological:     Mental Status: She is alert.     GCS: GCS eye subscore is 4. GCS verbal subscore is 5. GCS motor subscore is 6.     Comments: Speech is clear and goal oriented, follows commands Major Cranial nerves without deficit, no facial droop Moves extremities without ataxia, coordination intact  Psychiatric:        Behavior: Behavior normal.    ED Results / Procedures / Treatments   Labs (all labs ordered are listed, but only abnormal results are displayed) Labs Reviewed  RESP PANEL BY RT-PCR (FLU A&B, COVID) ARPGX2 - Abnormal; Notable for the following components:      Result Value   SARS Coronavirus 2 by RT PCR POSITIVE (*)    All other components within normal limits  CBC WITH DIFFERENTIAL/PLATELET - Abnormal; Notable for the following components:   RBC 5.51 (*)    Hemoglobin 15.3 (*)    HCT 47.9 (*)    RDW 17.6 (*)    All other components within normal limits  COMPREHENSIVE METABOLIC PANEL - Abnormal; Notable for the following components:   Sodium 132 (*)    Creatinine, Ser 1.07 (*)    Calcium 8.7 (*)    All other components within normal limits  URINALYSIS, ROUTINE W REFLEX MICROSCOPIC - Abnormal; Notable for the following components:   APPearance HAZY (*)    Ketones, ur 20 (*)    Protein, ur >=300 (*)    Bacteria, UA RARE (*)    All other components within normal limits  LIPASE, BLOOD  POC URINE PREG, ED    EKG None  Radiology DG Chest Portable 1 View  Result Date: 01/27/2021 CLINICAL DATA:  Fever, headache, chest pain EXAM: PORTABLE CHEST 1  VIEW COMPARISON:  Chest radiograph 03/26/2020 FINDINGS: The cardiomediastinal silhouette is normal. The lungs clear, with no focal consolidation or  pulmonary edema. There is no pleural effusion or pneumothorax. The bones are unremarkable. IMPRESSION: No radiographic evidence of acute cardiopulmonary process. Electronically Signed   By: Lesia Hausen M.D.   On: 01/27/2021 08:42    Procedures Procedures   Medications Ordered in ED Medications  sodium chloride 0.9 % bolus 1,000 mL (1,000 mLs Intravenous Bolus from Bag 01/27/21 0806)  acetaminophen (TYLENOL) tablet 650 mg (650 mg Oral Given 01/27/21 1058)    ED Course  I have reviewed the triage vital signs and the nursing notes.  Pertinent labs & imaging results that were available during my care of the patient were reviewed by me and considered in my medical decision making (see chart for details).    MDM Rules/Calculators/A&P                           Additional history obtained from: Nursing notes from this visit. Review of electronic medical records. -------------- 35 year old female presented for a headache, low back pain onset 3 days ago.  EMS reported fever, no antibiotics prior to arrival, patient afebrile upon triage here.  On exam she is uncomfortable appearing no acute distress she is tender with palpation of the paraspinal musculature.  No nerves intact.  No nuchal rigidity.  Abdomen soft nontender.  Cardiopulmonary exam unremarkable.  No urinary symptoms but patient reports history of UTI in the past.  Patient has  not received her COVID-19 vaccines.  Will obtain basic labs, COVID test, chest x-ray.  Will give IV fluids and monitor.  Patient's vital signs are within normal limits upon arrival. ----------------- I ordered, reviewed and interpreted labs which include:  Urinalysis without evidence for infection.  Shows ketones which are suspected to be due to dehydration.    CBC without leukocytosis to suggest bacterial  infection.  Hemoglobin 15.3 suspect elevated due to dehydration.  No thrombocytopenia.  CMP shows slightly elevated creatinine at 1.07, up from 1.0 10 months ago suspect this is due to dehydration.  No emergent electrolyte derangement, LFT elevations or gap.  Bicarb within normal limits.  Lipase within normal limits.  Urine pregnancy test negative  DG Chest:  IMPRESSION:  No radiographic evidence of acute cardiopulmonary process.   COVID test positive --------------- Patient reassessed resting comfortably bed no acute distress.  Patient is received IV fluids and Tylenol.  Patient is tolerating p.o. here, ambulating around the emergency department no acute distress.  Vital signs remained within normal limits.  I suspect patient's body aches, back pain, headache today are due to COVID-19 viral infection.  There is no indication for admission in regards to COVID at this time.  No hypoxia, no respiratory complaints such as chest pain or shortness of breath.  Patient encouraged water intake to avoid dehydration, OTC anti-inflammatories and close PCP follow-up.  Of note patient does not meet qualifications for Paxlovid.  I encouraged patient to monitor her symptoms closely and return to the ER for any new or worsening symptoms.  At this time no evidence for meningitis, pneumonia, UTI or other emergent pathologies of headache and body aches.   At this time there does not appear to be any evidence of an acute emergency medical condition and the patient appears stable for discharge with appropriate outpatient follow up. Diagnosis was discussed with patient who verbalizes understanding of care plan and is agreeable to discharge. I have discussed return precautions with patient who verbalizes understanding. Patient encouraged to follow-up with their PCP. All questions answered.  Patient's case discussed with Dr. Adela LankFloyd who agrees with plan to discharge with follow-up.   Keenan BachelorJoanna R Ingram was evaluated in  Emergency Department on 01/27/2021 for the symptoms described in the history of present illness. She was evaluated in the context of the global COVID-19 pandemic, which necessitated consideration that the patient might be at risk for infection with the SARS-CoV-2 virus that causes COVID-19. Institutional protocols and algorithms that pertain to the evaluation of patients at risk for COVID-19 are in a state of rapid change based on information released by regulatory bodies including the CDC and federal and state organizations. These policies and algorithms were followed during the patient's care in the ED.   Note: Portions of this report may have been transcribed using voice recognition software. Every effort was made to ensure accuracy; however, inadvertent computerized transcription errors may still be present.  Final Clinical Impression(s) / ED Diagnoses Final diagnoses:  COVID-19  Body aches  Proteinuria, unspecified type    Rx / DC Orders ED Discharge Orders     None        Bill SalinasMorelli, Orphia Mctigue A, PA-C 01/27/21 1401    Melene PlanFloyd, Dan, DO 01/27/21 1406

## 2021-01-27 NOTE — ED Notes (Signed)
X-ray at bedside

## 2021-07-19 IMAGING — CR DG CHEST 2V
2 series · 2 of 2 positions shown · non-contrast
Comparison: None.

CLINICAL DATA: Shortness of breath

EXAM:
CHEST - 2 VIEW

[chest pa]
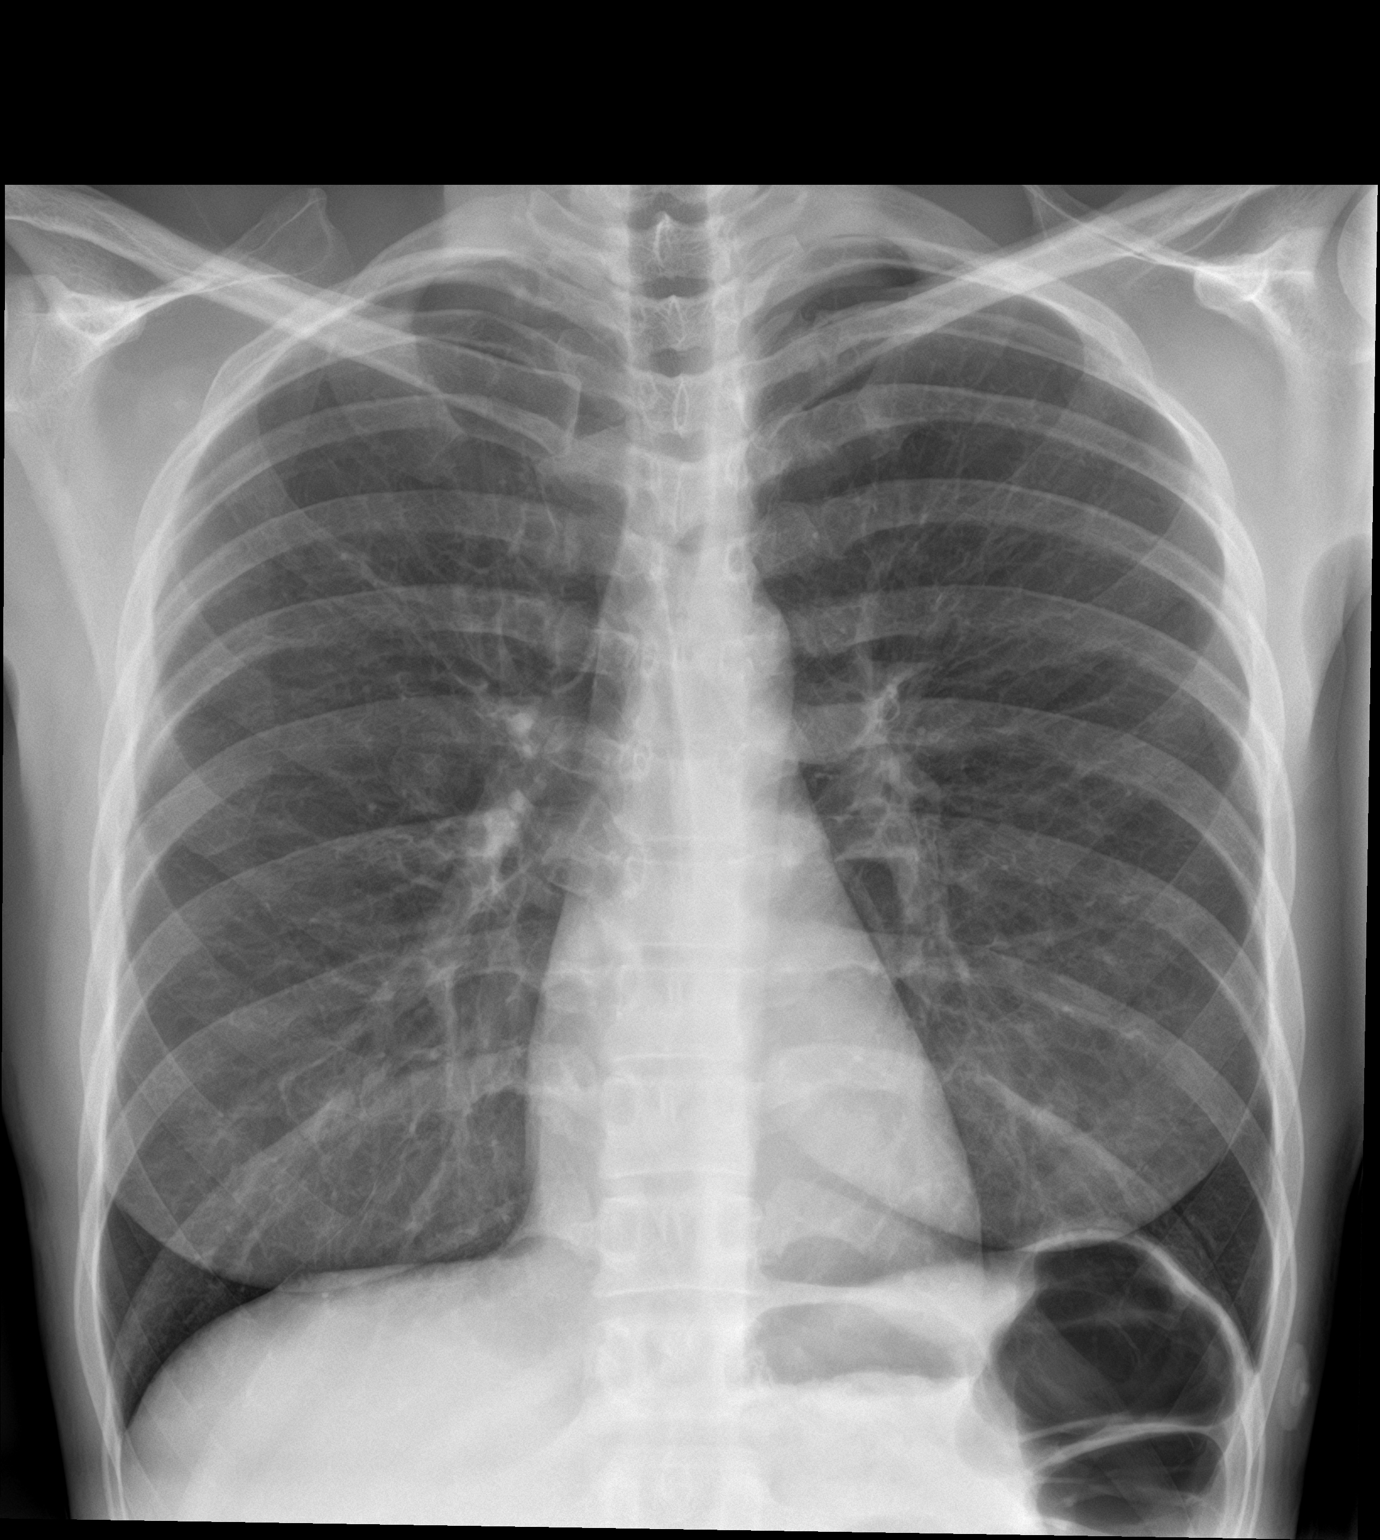

[chest lat]
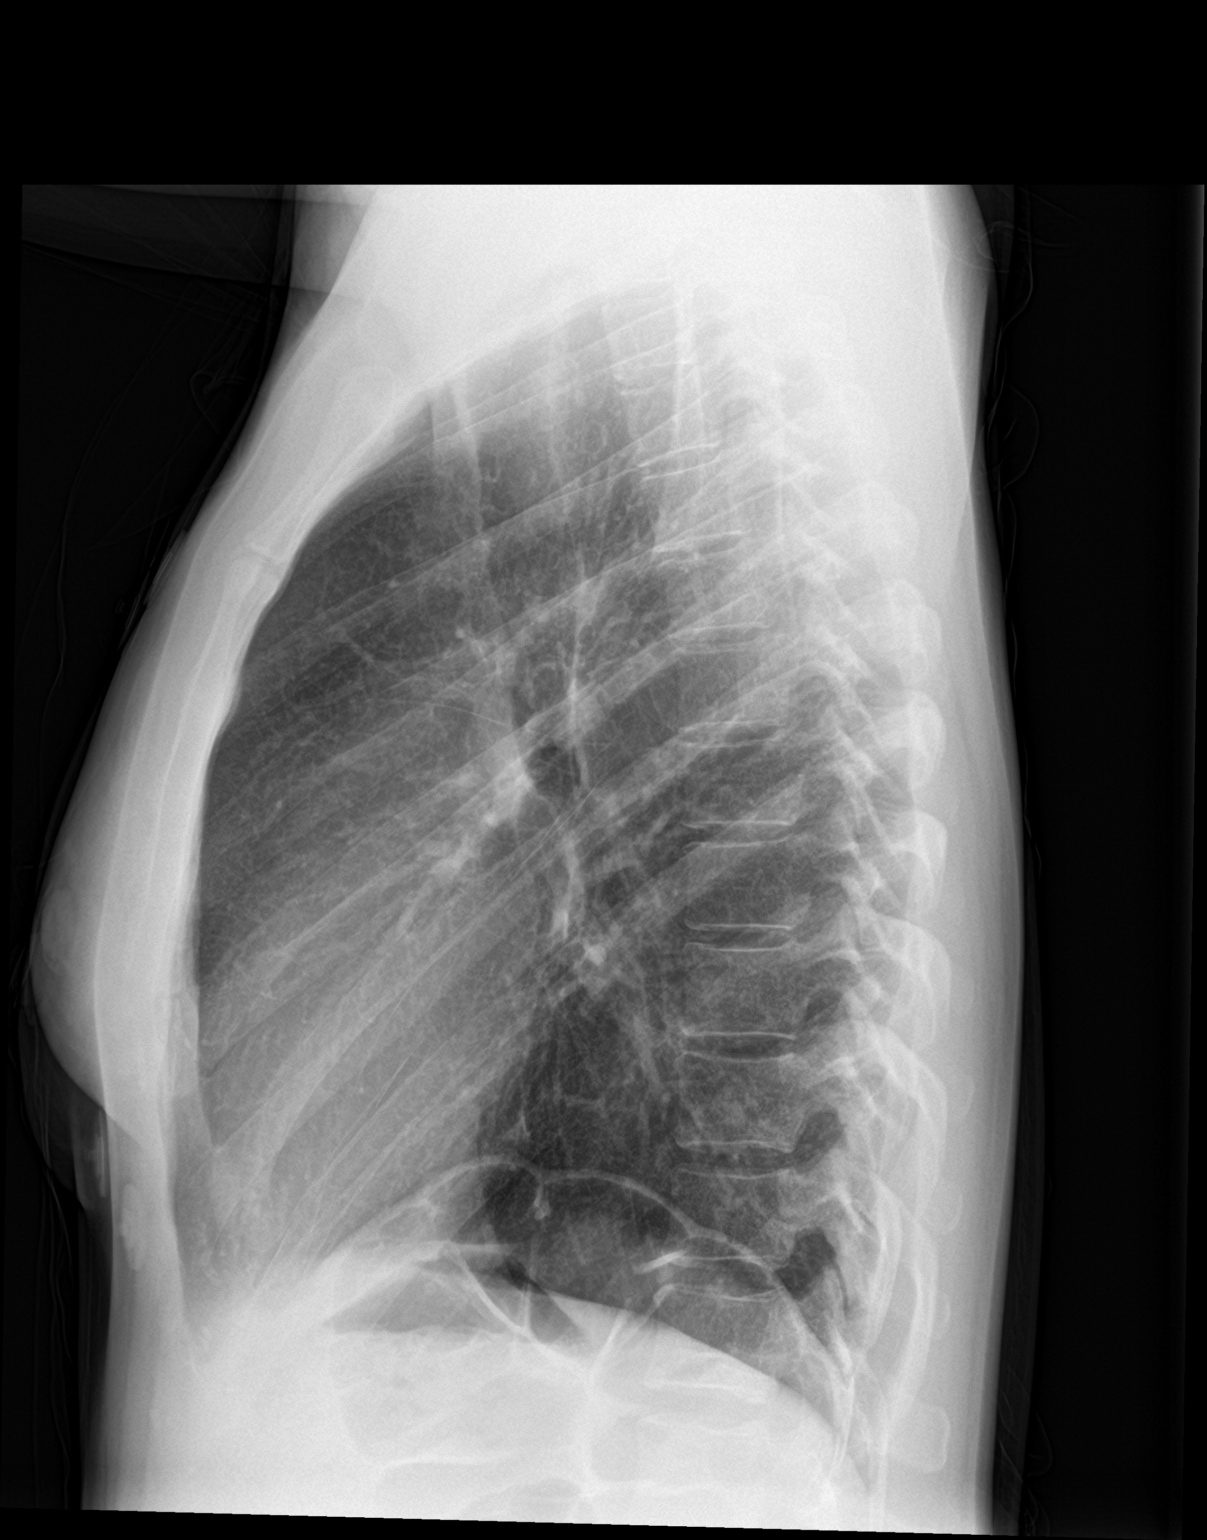

[2 of 2 positions shown; findings below may reference images not displayed]

FINDINGS: The heart size and mediastinal contours are within normal limits.
Both lungs are clear. The visualized skeletal structures are
unremarkable.
IMPRESSION: No active cardiopulmonary disease.

## 2022-04-07 ENCOUNTER — Ambulatory Visit: Payer: Self-pay

## 2022-09-13 ENCOUNTER — Ambulatory Visit (HOSPITAL_COMMUNITY)
Admission: EM | Admit: 2022-09-13 | Discharge: 2022-09-13 | Disposition: A | Discharge status: Home / Self Care | Payer: Medicaid Other | Attending: Family | Admitting: Family

## 2022-09-13 DIAGNOSIS — Z760 Encounter for issue of repeat prescription: Secondary | ICD-10-CM | POA: Insufficient documentation

## 2022-09-13 DIAGNOSIS — F4310 Post-traumatic stress disorder, unspecified: Secondary | ICD-10-CM | POA: Insufficient documentation

## 2022-09-13 DIAGNOSIS — Z789 Other specified health status: Secondary | ICD-10-CM

## 2022-09-13 MED ORDER — TRAZODONE HCL 50 MG PO TABS: 50.0000 mg | ORAL_TABLET | Freq: Every day | ORAL | 0 refills | Status: DC

## 2022-09-13 NOTE — ED Provider Notes (Signed)
Behavioral Health Urgent Care Medical Screening Exam  Patient Name: Tiffany [name] MRN: [id number] Date of Evaluation: 09/13/22 Chief Complaint:   Diagnosis:  Final diagnoses:  Difficulty refilling prescriptions    History of Present illness: Tiffany [name] is a 37 y.o. female. Patient presents voluntarily to [facility] behavioral health for walk-in assessment.  Patient is accompanied by a friend who does not remain present during assessment.  Patient is assessed, face-to-face, by nurse practitioner. She is seated in assessment area, no acute distress. Consulted with provider, Dr.  Lucianne Muss, and chart reviewed on 09/13/2022. She  is alert and oriented, pleasant and cooperative during assessment.   Tiffany [name] presents seeking medication refills.  She was released from jail 2 days ago after a 1 month stay.  While in jail she was compliant with a combination of Effexor and trazodone.  Would like to get restarted on these medications.  Trazodone dose 50 mg.  She is unable to recall Effexor dose.    She reports poor sleep ongoing x 2 days.  Sleeping less than 6 hours each night.  Patient endorses history of generalized anxiety disorder and PTSD.  She also endorses history of alcohol and substance use disorders.  She has been followed by [facility] reportedly linked with [facility] ACT team in 2015.  Plans to follow-up with [facility] later today, if unable to join ACT team she would like to follow-up with [facility] behavioral health outpatient.  She endorses history of several previous inpatient psychiatric hospitalizations.  No family mental health or addiction history reported.  Tiffany [name] reports she used a small amount of alcohol and a small amount of marijuana 2 days ago upon discharge from jail.  Prior to this she had been sober for several weeks then spent 1 month in jail.  She declines substance use treatment options currently.  Patient  presents with euthymic mood, congruent affect. She   denies suicidal and homicidal ideations. Denies history of suicide attempts, denies history of of non suicidal self-harm.  Patient easily  contracts verbally for safety with this Clinical research associate.   Patient has normal speech and behavior.  She  denies auditory and visual hallucinations.  Patient is able to converse coherently with goal-directed thoughts and no distractibility or preoccupation.  Denies symptoms of paranoia.  Objectively there is no evidence of psychosis/mania or delusional thinking.  Tiffany [name] resides in [place] with a friend.  She denies access to weapons.  She has applied for disability and is awaiting decision.  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  Patient endorses average appetite.  Patient offered support and encouragement.   Patient educate and verbalizes understanding of mental health resources and other crisis services in the community. They are instructed to call 911 and present to the nearest emergency room should patient experience any suicidal/homicidal ideation, auditory/visual/hallucinations, or detrimental worsening of mental health condition.      Flowsheet Row ED from 09/13/2022 in [facility] ED from 01/27/2021 in [facility] Emergency Department at [facility] ED from 07/22/2020 in [facility] Urgent Care at [facility] RISK CATEGORY No Risk No Risk No Risk       Psychiatric Specialty Exam  Presentation  General Appearance:Appropriate for Environment; Casual  Eye Contact:Good  Speech:Clear and Coherent; Normal Rate  Speech Volume:Normal  Handedness:Right   Mood and Affect  Mood: Euthymic  Affect: Congruent; Appropriate   Thought Process  Thought Processes: Coherent; Goal Directed; Linear  Descriptions  of Associations:Intact  Orientation:Full (Time, Place and Person)  Thought Content:Logical; WDL    Hallucinations:None  Ideas of Reference:None  Suicidal  Thoughts:No  Homicidal Thoughts:No   Sensorium  Memory: Immediate Good; Recent Good  Judgment: Fair  Insight: Fair   Executive Functions  Concentration: Good  Attention Span: Good  Recall: Good  Fund of Knowledge: Fair  Language: Fair   Psychomotor Activity  Psychomotor Activity: Normal   Assets  Assets: Communication Skills; Desire for Improvement; Housing; Physical Health; Resilience; Social Support   Sleep  Sleep: Poor  Number of hours: No data recorded  Physical Exam: Physical Exam Vitals and nursing note reviewed.  Constitutional:      Appearance: She is well-developed.  HENT:     Head: Normocephalic.     Nose: Nose normal.  Cardiovascular:     Rate and Rhythm: Normal rate.  Pulmonary:     Effort: Pulmonary effort is normal.  Musculoskeletal:        General: Normal range of motion.     Cervical back: Normal range of motion.  Skin:    General: Skin is warm and dry.  Neurological:     Mental Status: She is alert and oriented to person, place, and time.  Psychiatric:        Attention and Perception: Attention and perception normal.        Mood and Affect: Mood and affect normal.        Speech: Speech normal.        Behavior: Behavior normal. Behavior is cooperative.        Thought Content: Thought content normal.        Cognition and Memory: Cognition and memory normal.    Review of Systems  Constitutional: Negative.   HENT: Negative.    Eyes: Negative.   Respiratory: Negative.    Cardiovascular: Negative.   Gastrointestinal: Negative.   Genitourinary: Negative.   Musculoskeletal: Negative.   Skin: Negative.   Neurological: Negative.   Psychiatric/Behavioral: Negative.     Blood pressure 127/87, pulse 87, temperature 98.3 F (36.8 C), resp. rate 19, SpO2 100 %, unknown if currently breastfeeding. There is no height or weight on file to calculate BMI.  Musculoskeletal: Strength & Muscle Tone: within normal limits Gait &  Station: normal Patient leans: N/A   [facility] MSE Discharge Disposition for Follow up and Recommendations: Based on my evaluation the patient does not appear to have an emergency medical condition and can be discharged with resources and follow up care in outpatient services for Medication Management and Individual Therapy  Follow-up with outpatient psychiatry, resources provided. Medications: -Trazodone 50 mg nightly as needed/sleep  Lenard Lance, FNP 09/13/2022, 12:21 PM

## 2022-09-13 NOTE — Discharge Instructions (Addendum)
Patient is instructed prior to discharge to:  Take all medications as prescribed by his/her mental healthcare provider. Report any adverse effects and or reactions from the medicines to his/her outpatient provider promptly. Keep all scheduled appointments, to ensure that you are getting refills on time and to avoid any interruption in your medication.  If you are unable to keep an appointment call to reschedule.  Be sure to follow-up with resources and follow-up appointments provided.  Patient has been instructed & cautioned: To not engage in alcohol and or illegal drug use while on prescription medicines. In the event of worsening symptoms, patient is instructed to call the crisis hotline, 911 and or go to the nearest ED for appropriate evaluation and treatment of symptoms. To follow-up with his/her primary care provider for your other medical issues, concerns and or health care needs.  Information: -National Suicide Prevention Lifeline 1-800-SUICIDE or 1-800-273-8255.  -988 offers 24/7 access to trained crisis counselors who can help people experiencing mental health-related distress. People can call or text 988 or chat 988lifeline.org for themselves or if they are worried about a loved one who may need crisis support.     Outpatient Services for Therapy and Medication Management for Medicaid  [facility] [address] [place], [place], [address] [phone] phone  New Patient Assessment/Therapy Walk-ins Monday and Wednesday: 8am until slots are full. Every 1st and 2nd Friday: 1pm - 5pm  NO ASSESSMENT/THERAPY WALK-INS ON TUESDAYS OR THURSDAYS  New Patient Psychiatry/Medication Management Walk-ins Monday-Friday: 8am-11am  For all walk-ins, we ask that you arrive by 7:30am because patient will be seen in the order of arrival.  Availability is limited; therefore, you may not be seen on the same day that you walk-in.  Our goal is to serve and meet the needs of our  community to the best of our ability.   [facility] [address] [place], [place], [address] [phone] phone  [facility] [address] [place], [place], [address] [phone] phone (Aetna, [employer]iHealth [place], BCBS, Cigna, Evernorth, [employer] Health Plans, [facility], BCBS Healthy Blue, Humana, [employer], Medcost, Medicare, Medicaid, Optum, Tricare, UHC, UHC Community Plan, Wellcare)  [facility] [address] [place], [place], [address] [phone] phone  [facility] [address] [place], [place], [address] [phone] phone  [facility] [address] [place], [place], [address] [phone] phone  [facility] [address] [place], [place], [address] [phone] phone  [facility] [address] [place], [place], [address] [phone] phone  [facility] [address] [place], [place], [address] [phone] phone [phone] fax  [facility] [address] [place], [place], [address] [phone] phone [phone] fax  [facility] [address] [place], [place], [address] [phone] phone  [facility] [address] [place], , [address]  [phone] phone  [facility]  (Adults Only) [address] , , [address]  [phone] phone [phone] fax  

## 2022-09-13 NOTE — Progress Notes (Signed)
   09/13/22 1124  [facility] Triage Screening (Walk-ins at [facility] only)  How Did You Hear About [place]? Self  What Is the Reason for Your Visit/Call Today? Pt is a 37 yo female who presents voluntarily to [facility]. Pt reports that she was recently released for [facility] jail two days ago. Pt reports that she was not released with any medications and is in need of medications ( Effexor and Trazodone). Pt reported that she is diagnosed with PTSD. Pt reports that she has not been sleeping since being back home. Pt reports that after this visit she plan to go to [facility] and sign up again for services with the [facility]T. Pt denies SI, HI, AVH.  How Long Has This Been Causing You Problems? <Week  Have You Recently Had Any Thoughts About Hurting Yourself? No  Are You Planning to Commit Suicide/Harm Yourself At This time? No  Have you Recently Had Thoughts About Hurting Someone Karolee Ohs? No  Are You Planning To Harm Someone At This Time? No  Are you currently experiencing any auditory, visual or other hallucinations? No  Have You Used Any Alcohol or Drugs in the Past 24 Hours? No  Do you have any current medical co-morbidities that require immediate attention? No  Clinician description of patient physical appearance/behavior: Pt was casually dressed and adequately groomed. Pt was polite, talkative and cooperative. Pt's speech, movement and thought content were within normal limits. Pt's mood was a bit anxious. Pt was oriented x 4.  What Do You Feel Would Help You the Most Today? Medication(s)  If access to [facility] Urgent Care was not available, would you have sought care in the Emergency Department? Yes  Determination of Need Routine (7 days)  Options For Referral Medication Management;Outpatient Therapy    Flowsheet Row ED from 09/13/2022 in [facility] ED from 01/27/2021 in [facility] Emergency Department at [facility] ED from 07/22/2020 in [facility] Urgent Care at [facility] RISK CATEGORY  No Risk No Risk No Risk

## 2023-04-03 ENCOUNTER — Encounter (HOSPITAL_COMMUNITY): Payer: Self-pay | Admitting: Emergency Medicine

## 2023-04-03 ENCOUNTER — Emergency Department (HOSPITAL_COMMUNITY): Payer: BLUE CROSS/BLUE SHIELD

## 2023-04-03 ENCOUNTER — Other Ambulatory Visit: Payer: Self-pay

## 2023-04-03 ENCOUNTER — Emergency Department (HOSPITAL_COMMUNITY)
Admission: EM | Admit: 2023-04-03 | Discharge: 2023-04-03 | Disposition: A | Discharge status: Home / Self Care | Payer: BLUE CROSS/BLUE SHIELD | Attending: Emergency Medicine | Admitting: Emergency Medicine

## 2023-04-03 DIAGNOSIS — W19XXXA Unspecified fall, initial encounter: Secondary | ICD-10-CM | POA: Insufficient documentation

## 2023-04-03 DIAGNOSIS — R42 Dizziness and giddiness: Secondary | ICD-10-CM | POA: Diagnosis not present

## 2023-04-03 DIAGNOSIS — M545 Low back pain, unspecified: Secondary | ICD-10-CM | POA: Diagnosis not present

## 2023-04-03 DIAGNOSIS — M549 Dorsalgia, unspecified: Secondary | ICD-10-CM

## 2023-04-03 DIAGNOSIS — R55 Syncope and collapse: Secondary | ICD-10-CM | POA: Insufficient documentation

## 2023-04-03 LAB — COMPREHENSIVE METABOLIC PANEL
ALT: 11 U/L (ref 0–44)
AST: 19 U/L (ref 15–41)
Albumin: 3.9 g/dL (ref 3.5–5.0)
Alkaline Phosphatase: 63 U/L (ref 38–126)
Anion gap: 6 (ref 5–15)
BUN: 16 mg/dL (ref 6–20)
CO2: 25 mmol/L (ref 22–32)
Calcium: 8.9 mg/dL (ref 8.9–10.3)
Chloride: 106 mmol/L (ref 98–111)
Creatinine, Ser: 0.81 mg/dL (ref 0.44–1.00)
GFR, Estimated: >60 mL/min (ref >60)
Glucose, Bld: 95 mg/dL (ref 70–99)
Potassium: 3.4 mmol/L — ABNORMAL LOW (ref 3.5–5.1)
Sodium: 137 mmol/L (ref 135–145)
Total Bilirubin: 0.6 mg/dL (ref 0.3–1.2)
Total Protein: 7.4 g/dL (ref 6.5–8.1)

## 2023-04-03 LAB — ETHANOL: Alcohol, Ethyl (B): <10 mg/dL (ref <10)

## 2023-04-03 LAB — CBC WITH DIFFERENTIAL/PLATELET
Abs Immature Granulocytes: 0.03 10*3/uL (ref 0.00–0.07)
Basophils Absolute: 0 10*3/uL (ref 0.0–0.1)
Basophils Relative: 1 %
Eosinophils Absolute: 0.2 10*3/uL (ref 0.0–0.5)
Eosinophils Relative: 3 %
HCT: 36.2 % (ref 36.0–46.0)
Hemoglobin: 11.5 g/dL — ABNORMAL LOW (ref 12.0–15.0)
Immature Granulocytes: 1 %
Lymphocytes Relative: 27 %
Lymphs Abs: 1.7 10*3/uL (ref 0.7–4.0)
MCH: 27.6 pg (ref 26.0–34.0)
MCHC: 31.8 g/dL (ref 30.0–36.0)
MCV: 86.8 fL (ref 80.0–100.0)
Monocytes Absolute: 0.7 10*3/uL (ref 0.1–1.0)
Monocytes Relative: 11 %
Neutro Abs: 3.7 10*3/uL (ref 1.7–7.7)
Neutrophils Relative %: 57 %
Platelets: 452 10*3/uL — ABNORMAL HIGH (ref 150–400)
RBC: 4.17 MIL/uL (ref 3.87–5.11)
RDW: 17.4 % — ABNORMAL HIGH (ref 11.5–15.5)
WBC: 6.4 10*3/uL (ref 4.0–10.5)
nRBC: 0 % (ref 0.0–0.2)

## 2023-04-03 LAB — HCG, SERUM, QUALITATIVE: Preg, Serum: NEGATIVE

## 2023-04-03 MED ORDER — LACTATED RINGERS IV BOLUS
1000.0000 mL | Freq: Once | INTRAVENOUS | Status: AC
  Administered 2023-04-03: 1000 mL via INTRAVENOUS

## 2023-04-03 MED ORDER — LACTATED RINGERS IV BOLUS: 1000.0000 mL | Freq: Once | INTRAVENOUS | Status: DC

## 2023-04-03 MED ORDER — METHOCARBAMOL 500 MG PO TABS: 500.0000 mg | ORAL_TABLET | Freq: Two times a day (BID) | ORAL | 0 refills | Status: DC | PRN

## 2023-04-03 MED ORDER — FENTANYL CITRATE PF 50 MCG/ML IJ SOSY
50.0000 ug | PREFILLED_SYRINGE | Freq: Once | INTRAMUSCULAR | Status: AC
  Administered 2023-04-03: 50 ug via INTRAVENOUS
  Filled 2023-04-03: qty 1

## 2023-04-03 MED ORDER — IBUPROFEN 600 MG PO TABS: 600.0000 mg | ORAL_TABLET | Freq: Four times a day (QID) | ORAL | 0 refills | Status: DC | PRN

## 2023-04-03 MED ORDER — KETOROLAC TROMETHAMINE 15 MG/ML IJ SOLN
15.0000 mg | Freq: Once | INTRAMUSCULAR | Status: AC
  Administered 2023-04-03: 15 mg via INTRAVENOUS
  Filled 2023-04-03: qty 1

## 2023-04-03 MED ORDER — LACTATED RINGERS IV BOLUS: 500.0000 mL | Freq: Once | INTRAVENOUS | Status: DC

## 2023-04-03 NOTE — ED Triage Notes (Signed)
Per [facility] pt [place] cour[place] where she stood up and began having lower back pain. States the pain caused her to fall and since then having pain shooting down both of her legs.

## 2023-04-03 NOTE — ED Provider Notes (Addendum)
[place] EMERGENCY DEPARTMENT AT [facility] Provider Note   CSN: [id number] Arrival date & time: 04/03/23  9562     History  Chief Complaint  Patient presents with   Back Pain   Fall    Tiffany [name] is a 37 y.o. female.  Patient is a 37 year old female who presents after syncopal episode.  She was at the [facility] court house.  She says she was sitting on the bench and felt lightheaded.  She felt like she was going to pass out.  Her vision got blurry and she caught herself.  She sounds like she did have a brief syncopal event but she did not fall to the ground.  She woke right up and says she caught herself.  She said she was leaning a little bit twisted and her backs been hurting since that time.  She has pain across her back.  There is no numbness or weakness in her legs.  No associated abdominal pain.  She did not hit her head.  She said she was feeling fine prior to this event.  She had 1 prior syncopal event at church several years ago.  She did not have any chest pain or shortness of breath prior to the event.  No palpitations.  No vomiting or diarrhea.  No other recent illnesses.  She does have a history of alcohol and substance abuse.  She reports some recent marijuana use but no other drug use.  She does states she drinks some wine this morning.       Home Medications Prior to Admission medications   Medication Sig Start Date End Date Taking? Authorizing Provider  ibuprofen (ADVIL) 200 MG tablet Take 400 mg by mouth every 6 (six) hours as needed for headache or mild pain.    [provider]  traZODone (DESYREL) 50 MG tablet Take 1 tablet (50 mg total) by mouth at bedtime. 09/13/22   Lenard Lance, FNP      Allergies    Shellfish allergy, Pollen extract, and Aspirin    Review of Systems   Review of Systems  Constitutional:  Positive for fatigue. Negative for chills, diaphoresis and fever.  HENT:  Negative for congestion, rhinorrhea and  sneezing.   Eyes: Negative.   Respiratory:  Negative for cough, chest tightness and shortness of breath.   Cardiovascular:  Negative for chest pain and leg swelling.  Gastrointestinal:  Negative for abdominal pain, blood in stool, diarrhea, nausea and vomiting.  Genitourinary:  Negative for difficulty urinating, flank pain, frequency and hematuria.  Musculoskeletal:  Positive for back pain. Negative for arthralgias.  Skin:  Negative for rash.  Neurological:  Positive for syncope and light-headedness. Negative for speech difficulty, weakness, numbness and headaches.    Physical Exam Updated Vital Signs BP 91/63   Pulse 72   Temp 98.5 F (36.9 C) (Oral)   Resp 16   Ht 5\' 6"  (1.676 m)   Wt 59 kg   SpO2 100%   BMI 20.98 kg/m  Physical Exam Constitutional:      Appearance: She is well-developed.  HENT:     Head: Normocephalic and atraumatic.  Eyes:     Pupils: Pupils are equal, round, and reactive to light.  Cardiovascular:     Rate and Rhythm: Normal rate and regular rhythm.     Heart sounds: Normal heart sounds.  Pulmonary:     Effort: Pulmonary effort is normal. No respiratory distress.     Breath sounds: Normal  breath sounds. No wheezing or rales.  Chest:     Chest wall: No tenderness.  Abdominal:     General: Bowel sounds are normal.     Palpations: Abdomen is soft.     Tenderness: There is no abdominal tenderness. There is no guarding or rebound.  Musculoskeletal:        General: Normal range of motion.     Cervical back: Normal range of motion and neck supple.     Comments: Positive tenderness to the lower back.  It is more in the musculature across the back.  There is no spinal tenderness.  Lymphadenopathy:     Cervical: No cervical adenopathy.  Skin:    General: Skin is warm and dry.     Findings: No rash.  Neurological:     General: No focal deficit present.     Mental Status: She is alert and oriented to person, place, and time.     Comments: Motor 5 out of  5 all extremities, sensation grossly intact to light touch all extremities pedal pulses are intact     ED Results / Procedures / Treatments   Labs (all labs ordered are listed, but only abnormal results are displayed) Labs Reviewed  COMPREHENSIVE METABOLIC PANEL - Abnormal; Notable for the following components:      Result Value   Potassium 3.4 (*)    All other components within normal limits  CBC WITH DIFFERENTIAL/PLATELET - Abnormal; Notable for the following components:   Hemoglobin 11.5 (*)    RDW 17.4 (*)    Platelets 452 (*)    All other components within normal limits  ETHANOL  HCG, SERUM, QUALITATIVE  URINALYSIS, W/ REFLEX TO CULTURE (INFECTION SUSPECTED)  RAPID URINE DRUG SCREEN, HOSP PERFORMED    EKG EKG Interpretation Date/Time:  Wednesday April 03 2023 10:50:16 EDT Ventricular Rate:  67 PR Interval:  149 QRS Duration:  87 QT Interval:  411 QTC Calculation: 434 R Axis:   86  Text Interpretation: Sinus rhythm since last tracing no significant change Confirmed by Rolan Bucco [phone]) on 04/03/2023 11:19:14 AM  Radiology DG Lumbar Spine Complete  Result Date: 04/03/2023 CLINICAL DATA:  Back pain EXAM: LUMBAR SPINE - COMPLETE 4+ VIEW COMPARISON:  No prior lumbar spine radiographs available, correlation is made with 08/17/2019 CT abdomen pelvis FINDINGS: No lumbar spine fracture. Vertebral body heights are preserved. Intervertebral disc spaces are preserved. Straightening of the normal lumbar lordosis. No significant listhesis. Transitional anatomy, with 4 non-rib-bearing, lumbar type vertebral bodies. Mild lower lumbar facet arthropathy. IMPRESSION: 1. Mild lower lumbar facet arthropathy. No acute osseous abnormality. 2. Transitional anatomy, with 4 non-rib-bearing, lumbar type vertebral bodies. Electronically Signed   By: Wiliam Ke M.D.   On: 04/03/2023 14:14    Procedures Procedures    Medications Ordered in ED Medications  fentaNYL (SUBLIMAZE)  injection 50 mcg (has no administration in time range)  lactated ringers bolus 1,000 mL (0 mLs Intravenous Stopped 04/03/23 1222)  ketorolac (TORADOL) 15 MG/ML injection 15 mg (15 mg Intravenous Given 04/03/23 1121)  lactated ringers bolus 1,000 mL (1,000 mLs Intravenous New Bag/Given 04/03/23 1223)    ED Course/ Medical Decision Making/ A&P                                 Medical Decision Making Amount and/or Complexity of Data Reviewed Labs: ordered. Radiology: ordered.  Risk Prescription drug management.   Patient is a 37 year old  female who presents after syncopal episode this morning.  She did have preceding symptoms of lightheadedness.  She had no associated chest pain or shortness of breath.  No witnessed seizure activity.  She was hypotensive on arrival to the ED.  Her blood pressures were in the 60s.  She was given IV fluids and it responded this.  Her pressures are still little bit soft but they are in the low 100s.  Her labs are overall nonconcerning.  Her white count is normal.  We are still awaiting a urinalysis.  Her EtOH level is negative.  UDS is pending.  She does complain of low back pain.  She said this started after the event.  She did not have a fall but sounds like she may have twisted her back.  X-rays were obtained which were interpreted by me and confirmed by the radiologist to show no bony injuries.  No fracture or subluxation.  She did not have any back pain prior to the event.  She does not have any midline tenderness.  She mostly has tenderness across the musculature bilaterally in the lower lumbar region.    This would give me a lower concern for other etiologies such as epidural abscess/discitis.  She was initially given some Toradol.  She went to sleep for couple hours and seemed to be comfortable.  On waking, we attempted to get her up to walk her to the bathroom and she was able to stand up.  However she complains of significant back pain and wants a shot.  I am  reluctant to give her muscle relaxers given her soft blood pressure.  Will give her a dose of fentanyl.  Will need reassessment after this.  Care turned over to Dr. Particia Nearing pending reassessment/u/a results.  Final Clinical Impression(s) / ED Diagnoses Final diagnoses:  Syncope, unspecified syncope type  Acute bilateral back pain, unspecified back location    Rx / DC Orders ED Discharge Orders     None         Rolan Bucco, MD 04/03/23 1506    Rolan Bucco, MD 04/03/23 1514

## 2023-04-03 NOTE — ED Provider Notes (Signed)
Pt signed out from Dr. Fredderick Phenix pending urine.  Pt has still not given a urine.  BP is still in the 90s.  Pt wants to go home.  Pt is able to stand up and take a few steps.  She said it feels like a back spasm.  She will not stay any more because her ride is here.  She does know to return if worse.    Jacalyn Lefevre, MD 04/03/23 [phone]

## 2023-04-03 NOTE — ED Notes (Signed)
MD aware of BP.  No new orders at this time. Bolus complete

## 2023-04-03 NOTE — ED Notes (Signed)
EDP asked staff to attempt to ambulate Pt and obtain a UA.  Per staff, Pt was able to stand and then proceeded to lay back down.  This writer attempted to get Pt to ambulate and stressed the importance of getting a UA and warming her muscles.  Pt began screaming at this writer about getting more pain medication.  This Clinical research associate informed her that nothing is currently ordered.  Pt began to escalate and cuss at this Clinical research associate.  Pt stated she wanted her "IV out and to walk the hell out of here."  Pt continued to escalate as several staff members tried to verbally de-escalate her.  Security called to bedside d/t Pt's behavior.  Department Director and EDP came to bedside.

## 2023-04-03 NOTE — ED Notes (Signed)
Md Haviland at bedside talking to pt

## 2023-04-03 NOTE — ED Notes (Signed)
Refusing vitals at this time 

## 2023-04-03 NOTE — ED Notes (Signed)
Writer attempted to get lab sample for I-stat chem 8, however after butterfly needle was in pt's arm she told writer to remove it immediately and stop "I don't want anything else stuck in me". Needle removed.

## 2023-04-03 NOTE — ED Notes (Signed)
Patient fluids not going in, patient reminded to keep arm straight.

## 2023-04-03 NOTE — ED Notes (Signed)
Patient transported to X-ray 

## 2023-06-03 ENCOUNTER — Emergency Department (HOSPITAL_COMMUNITY): Payer: BLUE CROSS/BLUE SHIELD

## 2023-06-03 ENCOUNTER — Inpatient Hospital Stay (HOSPITAL_COMMUNITY): Payer: BLUE CROSS/BLUE SHIELD

## 2023-06-03 ENCOUNTER — Inpatient Hospital Stay (HOSPITAL_COMMUNITY): Payer: BLUE CROSS/BLUE SHIELD | Admitting: Certified Registered"

## 2023-06-03 ENCOUNTER — Other Ambulatory Visit: Payer: Self-pay

## 2023-06-03 ENCOUNTER — Encounter (HOSPITAL_COMMUNITY)
Admission: EM | Disposition: A | Discharge status: Other Acute Inpatient Hospital | Payer: Self-pay | Source: Home / Self Care | Attending: Internal Medicine

## 2023-06-03 ENCOUNTER — Inpatient Hospital Stay (HOSPITAL_COMMUNITY)
Admission: EM | Admit: 2023-06-03 | Discharge: 2023-06-13 | DRG: 021 | Disposition: A | Discharge status: Other Acute Inpatient Hospital | Payer: BLUE CROSS/BLUE SHIELD | Attending: Internal Medicine | Admitting: Internal Medicine

## 2023-06-03 DIAGNOSIS — I609 Nontraumatic subarachnoid hemorrhage, unspecified: Secondary | ICD-10-CM | POA: Diagnosis present

## 2023-06-03 DIAGNOSIS — F250 Schizoaffective disorder, bipolar type: Secondary | ICD-10-CM | POA: Diagnosis present

## 2023-06-03 DIAGNOSIS — R001 Bradycardia, unspecified: Secondary | ICD-10-CM | POA: Diagnosis not present

## 2023-06-03 DIAGNOSIS — Z91013 Allergy to seafood: Secondary | ICD-10-CM

## 2023-06-03 DIAGNOSIS — Z87820 Personal history of traumatic brain injury: Secondary | ICD-10-CM

## 2023-06-03 DIAGNOSIS — F1910 Other psychoactive substance abuse, uncomplicated: Secondary | ICD-10-CM | POA: Diagnosis present

## 2023-06-03 DIAGNOSIS — Z781 Physical restraint status: Secondary | ICD-10-CM | POA: Diagnosis not present

## 2023-06-03 DIAGNOSIS — I959 Hypotension, unspecified: Secondary | ICD-10-CM | POA: Diagnosis present

## 2023-06-03 DIAGNOSIS — R45850 Homicidal ideations: Secondary | ICD-10-CM | POA: Diagnosis present

## 2023-06-03 DIAGNOSIS — Z5329 Procedure and treatment not carried out because of patient's decision for other reasons: Secondary | ICD-10-CM | POA: Diagnosis present

## 2023-06-03 DIAGNOSIS — F10221 Alcohol dependence with intoxication delirium: Secondary | ICD-10-CM | POA: Diagnosis present

## 2023-06-03 DIAGNOSIS — I10 Essential (primary) hypertension: Secondary | ICD-10-CM | POA: Diagnosis present

## 2023-06-03 DIAGNOSIS — Z833 Family history of diabetes mellitus: Secondary | ICD-10-CM | POA: Diagnosis not present

## 2023-06-03 DIAGNOSIS — R45851 Suicidal ideations: Secondary | ICD-10-CM | POA: Diagnosis present

## 2023-06-03 DIAGNOSIS — F14259 Cocaine dependence with cocaine-induced psychotic disorder, unspecified: Secondary | ICD-10-CM | POA: Diagnosis present

## 2023-06-03 DIAGNOSIS — R739 Hyperglycemia, unspecified: Secondary | ICD-10-CM | POA: Diagnosis not present

## 2023-06-03 DIAGNOSIS — D649 Anemia, unspecified: Secondary | ICD-10-CM | POA: Diagnosis not present

## 2023-06-03 DIAGNOSIS — G4700 Insomnia, unspecified: Secondary | ICD-10-CM | POA: Diagnosis present

## 2023-06-03 DIAGNOSIS — Z886 Allergy status to analgesic agent status: Secondary | ICD-10-CM | POA: Diagnosis not present

## 2023-06-03 DIAGNOSIS — E876 Hypokalemia: Secondary | ICD-10-CM | POA: Diagnosis not present

## 2023-06-03 DIAGNOSIS — Z8249 Family history of ischemic heart disease and other diseases of the circulatory system: Secondary | ICD-10-CM | POA: Diagnosis not present

## 2023-06-03 DIAGNOSIS — F32A Depression, unspecified: Secondary | ICD-10-CM | POA: Diagnosis present

## 2023-06-03 DIAGNOSIS — Z8049 Family history of malignant neoplasm of other genital organs: Secondary | ICD-10-CM | POA: Diagnosis not present

## 2023-06-03 DIAGNOSIS — Z79899 Other long term (current) drug therapy: Secondary | ICD-10-CM | POA: Diagnosis not present

## 2023-06-03 DIAGNOSIS — M79605 Pain in left leg: Secondary | ICD-10-CM | POA: Diagnosis not present

## 2023-06-03 DIAGNOSIS — F1423 Cocaine dependence with withdrawal: Secondary | ICD-10-CM | POA: Diagnosis present

## 2023-06-03 DIAGNOSIS — F1210 Cannabis abuse, uncomplicated: Secondary | ICD-10-CM | POA: Diagnosis present

## 2023-06-03 DIAGNOSIS — F10239 Alcohol dependence with withdrawal, unspecified: Secondary | ICD-10-CM | POA: Diagnosis present

## 2023-06-03 DIAGNOSIS — I608 Other nontraumatic subarachnoid hemorrhage: Secondary | ICD-10-CM | POA: Diagnosis not present

## 2023-06-03 DIAGNOSIS — G9340 Encephalopathy, unspecified: Secondary | ICD-10-CM | POA: Diagnosis present

## 2023-06-03 DIAGNOSIS — F17210 Nicotine dependence, cigarettes, uncomplicated: Secondary | ICD-10-CM | POA: Diagnosis present

## 2023-06-03 DIAGNOSIS — E871 Hypo-osmolality and hyponatremia: Secondary | ICD-10-CM | POA: Diagnosis present

## 2023-06-03 DIAGNOSIS — Z555 Less than a high school diploma: Secondary | ICD-10-CM

## 2023-06-03 DIAGNOSIS — I602 Nontraumatic subarachnoid hemorrhage from anterior communicating artery: Secondary | ICD-10-CM | POA: Diagnosis present

## 2023-06-03 DIAGNOSIS — F4310 Post-traumatic stress disorder, unspecified: Secondary | ICD-10-CM | POA: Diagnosis present

## 2023-06-03 HISTORY — PX: IR NEURO EACH ADD'L AFTER BASIC UNI LEFT (MS): IMG5373

## 2023-06-03 HISTORY — PX: RADIOLOGY WITH ANESTHESIA: SHX6223

## 2023-06-03 HISTORY — PX: IR 3D INDEPENDENT WKST: IMG2385

## 2023-06-03 HISTORY — PX: IR TRANSCATH/EMBOLIZ: IMG695

## 2023-06-03 HISTORY — PX: IR ANGIO INTRA EXTRACRAN SEL INTERNAL CAROTID BILAT MOD SED: IMG5363

## 2023-06-03 HISTORY — PX: IR ANGIOGRAM FOLLOW UP STUDY: IMG697

## 2023-06-03 HISTORY — PX: IR US GUIDE VASC ACCESS RIGHT: IMG2390

## 2023-06-03 HISTORY — PX: IR ANGIO VERTEBRAL SEL VERTEBRAL BILAT MOD SED: IMG5369

## 2023-06-03 LAB — CBC WITH DIFFERENTIAL/PLATELET
Abs Immature Granulocytes: 0.01 10*3/uL (ref 0.00–0.07)
Basophils Absolute: 0.1 10*3/uL (ref 0.0–0.1)
Basophils Relative: 1 %
Eosinophils Absolute: 0.1 10*3/uL (ref 0.0–0.5)
Eosinophils Relative: 1 %
HCT: 36.4 % (ref 36.0–46.0)
Hemoglobin: 11.7 g/dL — ABNORMAL LOW (ref 12.0–15.0)
Immature Granulocytes: 0 %
Lymphocytes Relative: 26 %
Lymphs Abs: 1.8 10*3/uL (ref 0.7–4.0)
MCH: 28.1 pg (ref 26.0–34.0)
MCHC: 32.1 g/dL (ref 30.0–36.0)
MCV: 87.3 fL (ref 80.0–100.0)
Monocytes Absolute: 0.7 10*3/uL (ref 0.1–1.0)
Monocytes Relative: 10 %
Neutro Abs: 4.4 10*3/uL (ref 1.7–7.7)
Neutrophils Relative %: 62 %
Platelets: 387 10*3/uL (ref 150–400)
RBC: 4.17 MIL/uL (ref 3.87–5.11)
RDW: 19.8 % — ABNORMAL HIGH (ref 11.5–15.5)
WBC: 7.1 10*3/uL (ref 4.0–10.5)
nRBC: 0 % (ref 0.0–0.2)

## 2023-06-03 LAB — COMPREHENSIVE METABOLIC PANEL
ALT: 10 U/L (ref 0–44)
AST: 20 U/L (ref 15–41)
Albumin: 3.8 g/dL (ref 3.5–5.0)
Alkaline Phosphatase: 60 U/L (ref 38–126)
Anion gap: 9 (ref 5–15)
BUN: 11 mg/dL (ref 6–20)
CO2: 21 mmol/L — ABNORMAL LOW (ref 22–32)
Calcium: 8.8 mg/dL — ABNORMAL LOW (ref 8.9–10.3)
Chloride: 108 mmol/L (ref 98–111)
Creatinine, Ser: 0.69 mg/dL (ref 0.44–1.00)
GFR, Estimated: >60 mL/min (ref >60)
Glucose, Bld: 95 mg/dL (ref 70–99)
Potassium: 3.4 mmol/L — ABNORMAL LOW (ref 3.5–5.1)
Sodium: 138 mmol/L (ref 135–145)
Total Bilirubin: 0.4 mg/dL (ref <1.2)
Total Protein: 7 g/dL (ref 6.5–8.1)

## 2023-06-03 LAB — RAPID URINE DRUG SCREEN, HOSP PERFORMED
Amphetamines: NOT DETECTED
Barbiturates: NOT DETECTED
Benzodiazepines: NOT DETECTED
Cocaine: POSITIVE — AB
Opiates: NOT DETECTED
Tetrahydrocannabinol: NOT DETECTED

## 2023-06-03 LAB — CBG MONITORING, ED: Glucose-Capillary: 112 mg/dL — ABNORMAL HIGH (ref 70–99)

## 2023-06-03 LAB — MAGNESIUM: Magnesium: 1.8 mg/dL (ref 1.7–2.4)

## 2023-06-03 LAB — HCG, SERUM, QUALITATIVE: Preg, Serum: NEGATIVE

## 2023-06-03 LAB — SALICYLATE LEVEL: Salicylate Lvl: <7 mg/dL — ABNORMAL LOW (ref 7.0–30.0)

## 2023-06-03 LAB — ACETAMINOPHEN LEVEL: Acetaminophen (Tylenol), Serum: <10 ug/mL — ABNORMAL LOW (ref 10–30)

## 2023-06-03 LAB — HIV ANTIBODY (ROUTINE TESTING W REFLEX): HIV Screen 4th Generation wRfx: NONREACTIVE

## 2023-06-03 LAB — ETHANOL: Alcohol, Ethyl (B): <10 mg/dL (ref <10)

## 2023-06-03 LAB — PROTIME-INR
INR: 1.1 (ref 0.8–1.2)
Prothrombin Time: 14.1 s (ref 11.4–15.2)

## 2023-06-03 SURGERY — IR WITH ANESTHESIA
Anesthesia: General

## 2023-06-03 MED ORDER — SODIUM CHLORIDE 0.9 % IV BOLUS: 250.0000 mL | INTRAVENOUS | Status: AC | PRN

## 2023-06-03 MED ORDER — LACTATED RINGERS IV SOLN: INTRAVENOUS | Status: DC | PRN

## 2023-06-03 MED ORDER — PHENYLEPHRINE HCL-NACL 20-0.9 MG/250ML-% IV SOLN
0.0000 ug/min | INTRAVENOUS | Status: DC
  Administered 2023-06-03: 25 ug/min via INTRAVENOUS
  Administered 2023-06-04: 60 ug/min via INTRAVENOUS
  Administered 2023-06-04: 85 ug/min via INTRAVENOUS
  Filled 2023-06-03 (×3): qty 250

## 2023-06-03 MED ORDER — DOCUSATE SODIUM 100 MG PO CAPS: 100.0000 mg | ORAL_CAPSULE | Freq: Two times a day (BID) | ORAL | Status: DC

## 2023-06-03 MED ORDER — POLYETHYLENE GLYCOL 3350 17 G PO PACK
17.0000 g | PACK | Freq: Every day | ORAL | Status: DC
  Administered 2023-06-04: 17 g
  Filled 2023-06-03: qty 1

## 2023-06-03 MED ORDER — FENTANYL CITRATE (PF) 100 MCG/2ML IJ SOLN
INTRAMUSCULAR | Status: DC | PRN
  Administered 2023-06-03 (×2): 100 ug via INTRAVENOUS

## 2023-06-03 MED ORDER — ONDANSETRON HCL 4 MG/2ML IJ SOLN
INTRAMUSCULAR | Status: DC | PRN
  Administered 2023-06-03: 4 mg via INTRAVENOUS

## 2023-06-03 MED ORDER — HEPARIN SODIUM (PORCINE) 1000 UNIT/ML IJ SOLN
INTRAMUSCULAR | Status: DC | PRN
  Administered 2023-06-03: 2000 [IU] via INTRAVENOUS
  Administered 2023-06-03: 1000 [IU] via INTRAVENOUS

## 2023-06-03 MED ORDER — FENTANYL CITRATE PF 50 MCG/ML IJ SOSY
50.0000 ug | PREFILLED_SYRINGE | INTRAMUSCULAR | Status: AC | PRN
  Administered 2023-06-03 (×3): 50 ug via INTRAVENOUS
  Filled 2023-06-03 (×3): qty 1

## 2023-06-03 MED ORDER — POTASSIUM CHLORIDE 10 MEQ/100ML IV SOLN
10.0000 meq | INTRAVENOUS | Status: AC
  Administered 2023-06-03 (×3): 10 meq via INTRAVENOUS
  Filled 2023-06-03 (×3): qty 100

## 2023-06-03 MED ORDER — THIAMINE HCL 100 MG/ML IJ SOLN
100.0000 mg | Freq: Every day | INTRAMUSCULAR | Status: DC
  Administered 2023-06-04: 100 mg via INTRAVENOUS
  Filled 2023-06-03: qty 2

## 2023-06-03 MED ORDER — PHENYLEPHRINE 80 MCG/ML (10ML) SYRINGE FOR IV PUSH (FOR BLOOD PRESSURE SUPPORT)
PREFILLED_SYRINGE | INTRAVENOUS | Status: DC | PRN
  Administered 2023-06-03 (×3): 80 ug via INTRAVENOUS

## 2023-06-03 MED ORDER — NIMODIPINE 6 MG/ML PO SOLN
60.0000 mg | ORAL | Status: DC
  Administered 2023-06-03 – 2023-06-04 (×5): 60 mg
  Filled 2023-06-03 (×5): qty 10

## 2023-06-03 MED ORDER — SODIUM CHLORIDE 0.9 % IV SOLN: INTRAVENOUS | Status: AC

## 2023-06-03 MED ORDER — POLYETHYLENE GLYCOL 3350 17 G PO PACK: 17.0000 g | PACK | Freq: Every day | ORAL | Status: DC | PRN

## 2023-06-03 MED ORDER — FENTANYL CITRATE PF 50 MCG/ML IJ SOSY
50.0000 ug | PREFILLED_SYRINGE | INTRAMUSCULAR | Status: DC | PRN
  Administered 2023-06-03 – 2023-06-04 (×2): 100 ug via INTRAVENOUS

## 2023-06-03 MED ORDER — SODIUM CHLORIDE 0.9 % IV SOLN
250.0000 mL | INTRAVENOUS | Status: AC
  Administered 2023-06-03 – 2023-06-04 (×2): 250 mL via INTRAVENOUS

## 2023-06-03 MED ORDER — PHENYLEPHRINE HCL-NACL 20-0.9 MG/250ML-% IV SOLN
INTRAVENOUS | Status: DC | PRN
  Administered 2023-06-03: 20 ug/min via INTRAVENOUS

## 2023-06-03 MED ORDER — DEXAMETHASONE SODIUM PHOSPHATE 10 MG/ML IJ SOLN
INTRAMUSCULAR | Status: DC | PRN
  Administered 2023-06-03: 8 mg via INTRAVENOUS

## 2023-06-03 MED ORDER — FENTANYL 2500MCG IN NS 250ML (10MCG/ML) PREMIX INFUSION
0.0000 ug/h | INTRAVENOUS | Status: DC
  Administered 2023-06-03 – 2023-06-04 (×2): 50 ug/h via INTRAVENOUS
  Filled 2023-06-03 (×2): qty 250

## 2023-06-03 MED ORDER — LEVETIRACETAM IN NACL 1000 MG/100ML IV SOLN
1000.0000 mg | Freq: Once | INTRAVENOUS | Status: AC
  Administered 2023-06-03: 1000 mg via INTRAVENOUS
  Filled 2023-06-03: qty 100

## 2023-06-03 MED ORDER — FENTANYL CITRATE (PF) 100 MCG/2ML IJ SOLN
INTRAMUSCULAR | Status: AC
  Filled 2023-06-03: qty 2

## 2023-06-03 MED ORDER — MAGNESIUM SULFATE 2 GM/50ML IV SOLN
2.0000 g | Freq: Once | INTRAVENOUS | Status: AC
  Administered 2023-06-03: 2 g via INTRAVENOUS
  Filled 2023-06-03: qty 50

## 2023-06-03 MED ORDER — IOHEXOL 300 MG/ML  SOLN
150.0000 mL | Freq: Once | INTRAMUSCULAR | Status: AC | PRN
  Administered 2023-06-03: 25 mL via INTRA_ARTERIAL

## 2023-06-03 MED ORDER — ROCURONIUM BROMIDE 10 MG/ML (PF) SYRINGE
PREFILLED_SYRINGE | INTRAVENOUS | Status: DC | PRN
  Administered 2023-06-03: 40 mg via INTRAVENOUS
  Administered 2023-06-03 (×2): 20 mg via INTRAVENOUS
  Administered 2023-06-03: 40 mg via INTRAVENOUS

## 2023-06-03 MED ORDER — NIMODIPINE 30 MG PO CAPS
60.0000 mg | ORAL_CAPSULE | ORAL | Status: DC
Start: 2023-06-03 — End: 2023-06-03
  Filled 2023-06-03 (×4): qty 2

## 2023-06-03 MED ORDER — PHENYLEPHRINE HCL-NACL 20-0.9 MG/250ML-% IV SOLN: 0.0000 ug/min | INTRAVENOUS | Status: DC

## 2023-06-03 MED ORDER — IOHEXOL 350 MG/ML SOLN
60.0000 mL | Freq: Once | INTRAVENOUS | Status: AC | PRN
  Administered 2023-06-03: 60 mL via INTRAVENOUS

## 2023-06-03 MED ORDER — DEXMEDETOMIDINE HCL IN NACL 80 MCG/20ML IV SOLN
INTRAVENOUS | Status: DC | PRN
  Administered 2023-06-03: 4 ug via INTRAVENOUS

## 2023-06-03 MED ORDER — HALOPERIDOL LACTATE 5 MG/ML IJ SOLN
5.0000 mg | Freq: Once | INTRAMUSCULAR | Status: AC
  Administered 2023-06-03: 5 mg via INTRAMUSCULAR
  Filled 2023-06-03: qty 1

## 2023-06-03 MED ORDER — PROPOFOL 1000 MG/100ML IV EMUL
0.0000 ug/kg/min | INTRAVENOUS | Status: DC
  Administered 2023-06-03 – 2023-06-04 (×2): 30 ug/kg/min via INTRAVENOUS
  Filled 2023-06-03 (×2): qty 100

## 2023-06-03 MED ORDER — ONDANSETRON HCL 4 MG/2ML IJ SOLN: 4.0000 mg | Freq: Four times a day (QID) | INTRAMUSCULAR | Status: DC | PRN

## 2023-06-03 MED ORDER — FOLIC ACID 5 MG/ML IJ SOLN
1.0000 mg | Freq: Every day | INTRAMUSCULAR | Status: DC
  Administered 2023-06-04: 1 mg via INTRAVENOUS
  Filled 2023-06-03 (×2): qty 0.2

## 2023-06-03 MED ORDER — ORAL CARE MOUTH RINSE: 15.0000 mL | OROMUCOSAL | Status: DC | PRN

## 2023-06-03 MED ORDER — CEFAZOLIN SODIUM-DEXTROSE 2-4 GM/100ML-% IV SOLN
INTRAVENOUS | Status: AC
  Filled 2023-06-03: qty 100

## 2023-06-03 MED ORDER — ADULT MULTIVITAMIN W/MINERALS CH
1.0000 | ORAL_TABLET | Freq: Every day | ORAL | Status: DC
  Administered 2023-06-04: 1
  Filled 2023-06-03 (×2): qty 1

## 2023-06-03 MED ORDER — CLEVIDIPINE BUTYRATE 0.5 MG/ML IV EMUL
0.0000 mg/h | INTRAVENOUS | Status: DC
  Administered 2023-06-03: 2 mg/h via INTRAVENOUS
  Filled 2023-06-03: qty 100
  Filled 2023-06-03: qty 50

## 2023-06-03 MED ORDER — ORAL CARE MOUTH RINSE
15.0000 mL | OROMUCOSAL | Status: DC
  Administered 2023-06-04 (×7): 15 mL via OROMUCOSAL

## 2023-06-03 MED ORDER — LORAZEPAM 2 MG/ML IJ SOLN
2.0000 mg | Freq: Once | INTRAMUSCULAR | Status: AC
  Administered 2023-06-03: 2 mg via INTRAMUSCULAR
  Filled 2023-06-03: qty 1

## 2023-06-03 MED ORDER — DOCUSATE SODIUM 100 MG PO CAPS
100.0000 mg | ORAL_CAPSULE | Freq: Two times a day (BID) | ORAL | Status: DC | PRN
Start: 2023-06-03 — End: 2023-06-03

## 2023-06-03 MED ORDER — PROPOFOL 10 MG/ML IV BOLUS
INTRAVENOUS | Status: DC | PRN
  Administered 2023-06-03 (×2): 50 mg via INTRAVENOUS
  Administered 2023-06-03: 100 mg via INTRAVENOUS

## 2023-06-03 MED ORDER — FENTANYL CITRATE PF 50 MCG/ML IJ SOSY: 50.0000 ug | PREFILLED_SYRINGE | INTRAMUSCULAR | Status: DC | PRN

## 2023-06-03 MED ORDER — CEFAZOLIN SODIUM-DEXTROSE 2-3 GM-%(50ML) IV SOLR
INTRAVENOUS | Status: DC | PRN
  Administered 2023-06-03: 2 g via INTRAVENOUS

## 2023-06-03 MED ORDER — IOHEXOL 300 MG/ML  SOLN
100.0000 mL | Freq: Once | INTRAMUSCULAR | Status: AC | PRN
  Administered 2023-06-03: 75 mL via INTRA_ARTERIAL

## 2023-06-03 MED ORDER — LIDOCAINE 2% (20 MG/ML) 5 ML SYRINGE
INTRAMUSCULAR | Status: DC | PRN
  Administered 2023-06-03: 40 mg via INTRAVENOUS

## 2023-06-03 MED ORDER — DOCUSATE SODIUM 50 MG/5ML PO LIQD
100.0000 mg | Freq: Two times a day (BID) | ORAL | Status: DC
  Administered 2023-06-03 – 2023-06-04 (×2): 100 mg
  Filled 2023-06-03 (×2): qty 10

## 2023-06-03 MED ORDER — ACETAMINOPHEN 325 MG PO TABS
650.0000 mg | ORAL_TABLET | ORAL | Status: DC | PRN
Start: 2023-06-03 — End: 2023-06-04

## 2023-06-03 MED ORDER — ACETAMINOPHEN 325 MG PO TABS: 650.0000 mg | ORAL_TABLET | ORAL | Status: DC | PRN

## 2023-06-03 MED ORDER — PROPOFOL 500 MG/50ML IV EMUL
INTRAVENOUS | Status: DC | PRN
  Administered 2023-06-03: 75 ug/kg/min via INTRAVENOUS
  Administered 2023-06-03: 20 mg via INTRAVENOUS

## 2023-06-03 MED ORDER — CHLORHEXIDINE GLUCONATE CLOTH 2 % EX PADS
6.0000 | MEDICATED_PAD | Freq: Every day | CUTANEOUS | Status: DC
  Administered 2023-06-03 – 2023-06-12 (×10): 6 via TOPICAL

## 2023-06-03 MED ORDER — DIPHENHYDRAMINE HCL 50 MG/ML IJ SOLN
50.0000 mg | Freq: Once | INTRAMUSCULAR | Status: AC
  Administered 2023-06-03: 50 mg via INTRAMUSCULAR
  Filled 2023-06-03: qty 1

## 2023-06-03 MED ORDER — SUCCINYLCHOLINE CHLORIDE 200 MG/10ML IV SOSY
PREFILLED_SYRINGE | INTRAVENOUS | Status: DC | PRN
  Administered 2023-06-03: 100 mg via INTRAVENOUS

## 2023-06-03 NOTE — ED Notes (Signed)
Soft wrist/ankle restraints removed.

## 2023-06-03 NOTE — Progress Notes (Signed)
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  On Propofol and Cleviprex, both being weaned. Goal SBP 120 - 160 per IR. Bed rest for 6 hours.   Will let her rest and remain intubated today and can perform SBT/WUA 12/31.  Of note, she is IVC'd 2/2 telling ED staff that she was going to go home to kill her kids and herself.   Vent and sedation orders placed. RN and pharmacy updated on plan of care. Also discussed with Dr. Delton Coombes.   Additional CC time: .   Rutherford Guys, PA - [facility] For pager details, please see AMION or use Epic chat  After 1900, please call [facility] for cross coverage needs 06/03/2023, 1:41 PM
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
## 2023-06-03 NOTE — Procedures (Signed)
INTERVENTIONAL NEURORADIOLOGY BRIEF POSTPROCEDURE NOTE  DIAGNOSTIC CEREBRAL ANGIOGRAM, ENDOVASCULAR ANEURYSM EMBOLIZATION, FLAT PANEL HEAD CT  Attending physician: Baldemar Lenis, MD  Pre-op diagnosis: SAH; Ruptured Acom Aneurysm  Post-op diagnosis: Same  Access site: Right common femoral artery.  Access closure: Perclose Prostyle.  Anesthesia: IR sedation: General endotracheal anesthesia.  Medication used: 3000 units of heparin IV.  Complications: None.  Estimated blood loss: Minimal.  Specimen: None.  Findings: A 4.1 x 2.8 x 2.4 mm irregularly shaped AcomA aneurysm with small pseudolobule at the left A1-A2 junction identified. No other aneurysm seen. Primary coiling embolization performed with complete aneurysm occlusion. No thromboembolic or hemorrhagic complication.   The patient tolerated the procedure well without incident or complication. She remained intubated and was transferred to ICU in stable condition.   PLAN: - Bed rest post femoral access x 6 hours. - Patient combative prior to induction, therefore, anesthesia team elected to keep patient intubated. No procedure related restrictions for extubation.
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
## 2023-06-03 NOTE — ED Notes (Signed)
Pt stated "I'm going to go home and kill my kids then myself."
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
## 2023-06-03 NOTE — ED Notes (Addendum)
Unable to perform CT scan , patient restless / moving . EDP notified .

## 2023-06-03 NOTE — Consult Note (Signed)
Chief Complaint: Patient was seen in consultation today for ruptured aneurysm  Referring Physician(s): Dr. Tressie Stalker  Supervising Physician: Joana Reamer Kizzie Fantasia  Patient Status: [name] - ED  History of Present Illness: Tiffany [name] is a 37 y.o. female with past medical history of polysubstance abuse admitted with severe headache, confusion, agitation, combativeness.  Initially she was thought to have agitation related to substance abuse and was given haldol and ativan in the ED. Subsequent CT Head showed a bleeding A-comm aneurysm with small bilateral frontal subarachnoid hemorrhage.  Patient assessed by Neurosurgery who felt she was appropriate for aneurysm coiling. IR consulted for angiogram with aneurysm embolization.   Patient assessed in the ED alongside Dr. Tommie Sams.  She is somnolent after receiving ativan and haldol.  She does move all extremities and makes grunting verbalizations, however does not interact or answer any questions.    Past Medical History:  Diagnosis Date   Addiction, marijuana (HCC)    Alcohol abuse    Chlamydia    Cocaine addiction (HCC)    Depression    History of concussion    Hypertension    does not take any meds    Past Surgical History:  Procedure Laterality Date   WISDOM TOOTH EXTRACTION  2007    Allergies: Shellfish allergy, Pollen extract, and Aspirin  Medications: Prior to Admission medications   Medication Sig Start Date End Date Taking? Authorizing Provider  ibuprofen (ADVIL) 200 MG tablet Take 400 mg by mouth every 6 (six) hours as needed for headache or mild pain.    [provider]  ibuprofen (ADVIL) 600 MG tablet Take 1 tablet (600 mg total) by mouth every 6 (six) hours as needed. 04/03/23   Jacalyn Lefevre, MD  methocarbamol (ROBAXIN) 500 MG tablet Take 1 tablet (500 mg total) by mouth 2 (two) times daily as needed for muscle spasms. 04/03/23   Jacalyn Lefevre, MD  traZODone (DESYREL) 50  MG tablet Take 1 tablet (50 mg total) by mouth at bedtime. 09/13/22   Lenard Lance, FNP     Family History  Problem Relation Age of Onset   Hypertension Other    Diabetes Other    Heart failure Mother    Hypertension Mother    Cervical cancer Paternal Aunt     Social History   Socioeconomic History   Marital status: Single    Spouse name: Not on file   Number of children: Not on file   Years of education: Not on file   Highest education level: Not on file  Occupational History   Not on file  Tobacco Use   Smoking status: Every Day    Current packs/day: 1.00    Average packs/day: 1 pack/day for 9.0 years (9.0 ttl pk-yrs)    Types: Cigarettes   Smokeless tobacco: Never  Substance and Sexual Activity   Alcohol use: Yes    Alcohol/week: 13.0 standard drinks of alcohol    Types: 12 Cans of beer, 1 Standard drinks or equivalent per week    Comment: daily   Drug use: Yes    Frequency: 1.0 times per week    Types: Marijuana, Cocaine    Comment: cocaine use currently   Sexual activity: Yes    Birth control/protection: Condom  Other Topics Concern   Not on file  Social History Narrative   Not on file   Social Drivers of Health   Financial Resource Strain: Not on file  Food Insecurity: Not on file  Transportation Needs: Not on file  Physical Activity: Not on file  Stress: Not on file  Social Connections: Unknown (10/17/2021)   [facility]   Social Network    Social Network: Not on file     Review of Systems: A 12 point ROS discussed and pertinent positives are indicated in the HPI above.  All other systems are negative.  Review of Systems  Unable to perform ROS: Mental status change    Vital Signs: BP (!) 143/90   Pulse 65   Temp 98.4 F (36.9 C) (Temporal)   Resp 19   SpO2 100%   Physical Exam Vitals and nursing note reviewed.  Constitutional:      General: She is not in acute distress.    Appearance: Normal appearance.  She is not ill-appearing.  HENT:     Mouth/Throat:     Mouth: Mucous membranes are moist.     Pharynx: Oropharynx is clear.  Cardiovascular:     Rate and Rhythm: Normal rate and regular rhythm.  Pulmonary:     Effort: Pulmonary effort is normal.     Breath sounds: Normal breath sounds.  Abdominal:     General: Abdomen is flat. There is no distension.     Palpations: Abdomen is soft.  Skin:    General: Skin is warm and dry.  Neurological:     Mental Status: She is alert.     Comments: Somnolent after receiving ativan and haldol.  Moving all extremities.  Withdrawal to pain.  No interaction or attempts to communicate.       MD Evaluation Airway: WNL Heart: WNL Abdomen: WNL Chest/ Lungs: WNL ASA  Classification: 3 Mallampati/Airway Score: Two   Imaging: CT ANGIO HEAD NECK W WO CM Addendum Date: 06/03/2023 ADDENDUM REPORT: 06/03/2023 05:06 ADDENDUM: Study discussed by telephone with PA ROBERT BROWNING on 06/03/2023 at 0437 hours. Electronically Signed   By: Odessa Fleming M.D.   On: 06/03/2023 05:06   Result Date: 06/03/2023 CLINICAL DATA:  37 year old female with altered mental status. Subarachnoid hemorrhage suspicious for ruptured intracranial aneurysm. EXAM: CT ANGIOGRAPHY HEAD AND NECK WITH AND WITHOUT CONTRAST TECHNIQUE: Multidetector CT imaging of the head and neck was performed using the standard protocol during bolus administration of intravenous contrast. Multiplanar CT image reconstructions and MIPs were obtained to evaluate the vascular anatomy. Carotid stenosis measurements (when applicable) are obtained utilizing NASCET criteria, using the distal internal carotid diameter as the denominator. RADIATION DOSE REDUCTION: This exam was performed according to the departmental dose-optimization program which includes automated exposure control, adjustment of the mA and/or kV according to patient size and/or use of iterative reconstruction technique. CONTRAST:  60mL OMNIPAQUE IOHEXOL  350 MG/ML SOLN COMPARISON:  Head CT 0333 hours today. FINDINGS: CT HEAD Brain: Stable 16 mm area of what appears to be primarily extra-axial hemorrhage in the interhemispheric fissure anterior to the 3rd ventricle (series 5, image 21) and a small amount of additional anterior inter hemispheric subarachnoid blood. Underlying normal cerebral volume. No ventriculomegaly. No IVH is visible. Small volume of additional bilateral basilar cistern blood is suspected, such as elsewhere in the suprasellar and sylvian fissures, stable. No discrete cerebral edema or cortically based infarct identified. Pre medullary cistern and cisterna magna remain normal. Calvarium and skull base: Intact, negative. Paranasal sinuses: Visualized paranasal sinuses and mastoids are stable and well aerated. Orbits: Visualized orbits and scalp soft tissues are within normal limits. CTA NECK Skeleton: No acute osseous abnormality identified. Age advanced cervical  disc bulging and endplate spurring. Upper chest: Negative.  Visible upper lungs are clear. Other neck: Negative. Aortic arch: 3 vessel arch.  No arch atherosclerosis. Right carotid system: Negative. Left carotid system: Negative. Vertebral arteries: Mild calcified plaque at the right subclavian artery origin. No stenosis. Normal right vertebral artery origin. Right vertebral artery is patent and within normal limits to the skull base. Proximal left subclavian artery and left vertebral artery origin are normal. Left vertebral artery is codominant, patent within normal limits to the skull base. CTA HEAD Posterior circulation: Codominant distal vertebral arteries, PICA origins, vertebrobasilar junction appear normal. Patent basilar artery without stenosis. Basilar tip, SCA and PCA origins are within normal limits. Diminutive posterior communicating arteries are present. Bilateral PCA branches are within normal limits. Anterior circulation: Both ICA siphons are patent no discrete siphon plaque,  but diminutive appearance of the supraclinoid ICAs probably reflects a degree of vaso spasm. MCA and ACA origins are patent but also diminutive. Anteriorly and superiorly directed saccular aneurysm arises from the anterior communicating artery at the junction with the left ACA (series 9, image 103, series 13, image 88). It is up to 4 mm in length. The tip is bulbous, about 3 mm and mildly irregular. This is directed slightly to the left of midline. Both ACA A2 segments remain patent. Other ACA branches are within normal limits. Left MCA M1 and bifurcation are patent. Right MCA M1 and bifurcation are patent. No MCA aneurysm. Bilateral MCA branches are within normal limits. Venous sinuses: Patent. Anatomic variants: None. Review of the MIP images confirms the above findings IMPRESSION: 1. 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 2. Mild intracranial artery Vasospasm. No large vessel occlusion. No 2nd aneurysm identified. No significant atherosclerosis in the head or neck. 3. Stable small volume acute intracranial hemorrhage from #1. No IVH, ventriculomegaly, midline shift. Electronically Signed: By: Odessa Fleming M.D. On: 06/03/2023 04:37   CT HEAD WO CONTRAST ( ) Result Date: 06/03/2023 CLINICAL DATA:  Mental status change, unknown cause EXAM: CT HEAD WITHOUT CONTRAST TECHNIQUE: Contiguous axial images were obtained from the base of the skull through the vertex without intravenous contrast. RADIATION DOSE REDUCTION: This exam was performed according to the departmental dose-optimization program which includes automated exposure control, adjustment of the mA and/or kV according to patient size and/or use of iterative reconstruction technique. COMPARISON:  CT head 11/04/2011 FINDINGS: Brain: No evidence of large-territorial acute infarction. No parenchymal hemorrhage. No mass  lesion. Parafalcine curvilinear and likely fall or hyperdensity measuring up to 17 mm with associated adjacent bilateral small volume inferior frontal subarachnoid hemorrhage (5:44, 3:22). No hydrocephalus. Basilar cisterns are patent. Vascular: No hyperdense vessel. Skull: No acute fracture or focal lesion. Sinuses/Orbits: Paranasal sinuses and mastoid air cells are clear. The orbits are unremarkable. Other: None. IMPRESSION: Findings suggestive of a bleeding A-comm aneurysm with small bilateral frontal subarachnoid hemorrhage. These results were called by telephone at the time of interpretation on 06/03/2023 at 3:47 am to provider Roxy Horseman , who verbally acknowledged these results. Electronically Signed   By: Tish Frederickson M.D.   On: 06/03/2023 03:47    Labs:  CBC: Recent Labs    04/03/23 1025 06/03/23 0148  WBC 6.4 7.1  HGB 11.5* 11.7*  HCT 36.2 36.4  PLT 452* 387    COAGS: No results for input(s): "INR", "APTT" in the last 8760 hours.  BMP: Recent Labs  04/03/23 1025 06/03/23 0148  NA 137 138  K 3.4* 3.4*  CL 106 108  CO2 25 21*  GLUCOSE 95 95  BUN 16 11  CALCIUM 8.9 8.8*  CREATININE 0.81 0.69  GFRNONAA >60 >60    LIVER FUNCTION TESTS: Recent Labs    04/03/23 1025 06/03/23 0148  BILITOT 0.6 0.4  AST 19 20  ALT 11 10  ALKPHOS 63 60  PROT 7.4 7.0  ALBUMIN 3.9 3.8    TUMOR MARKERS: No results for input(s): "AFPTM", "CEA", "CA199", "CHROMGRNA" in the last 8760 hours.  Assessment and Plan: Ruptured ACOM aneurysm Patient brought to [facility] ED by EMS for severe headache.  Also agitated, combative on scene with suicidal tendencies.  She was given haldol and ativan.  CT Head showed ruptured ACOM aneurysm.  Patient was assessed by NS who recommends coil embolization.  Case reviewed by Dr. Tommie Sams who intends to treat emergently.  Attempted to speak with patient, however she is not consentable.  Family not reachable for consent (only mother listed  in chart and does not answer).  Spoke with Dr. Lovell Sheehan who agrees emergent procedure indicated as well as CCM.   NPO.  INR pending.   Risks and benefits of cerebral angiogram with intervention were discussed with the patient including, but not limited to bleeding, infection, vascular injury, contrast induced renal failure, stroke or even death.  This interventional procedure involves the use of X-rays and because of the nature of the planned procedure, it is possible that we will have prolonged use of X-ray fluoroscopy.  Potential radiation risks to you include (but are not limited to) the following: - A slightly elevated risk for cancer  several years later in life. This risk is typically less than 0.5% percent. This risk is low in comparison to the normal incidence of human cancer, which is 33% for women and 50% for men according to the [employer] Society. - Radiation induced injury can include skin redness, resembling a rash, tissue breakdown / ulcers and hair loss (which can be temporary or permanent).   The likelihood of either of these occurring depends on the difficulty of the procedure and whether you are sensitive to radiation due to previous procedures, disease, or genetic conditions.   IF your procedure requires a prolonged use of radiation, you will be notified and given written instructions for further action.  It is your responsibility to monitor the irradiated area for the 2 weeks following the procedure and to notify your physician if you are concerned that you have suffered a radiation induced injury.    All of the patient's questions were answered, patient is agreeable to proceed.  Consent signed and in chart.   Thank you for this interesting consult.  I greatly enjoyed meeting JOURDIN OCASIO and look forward to participating in their care.  A copy of this report was sent to the requesting provider on this date.  Electronically Signed: Hoyt Koch,  PA 06/03/2023, 9:11 AM   I spent a total of 40 Minutes    in face to face in clinical consultation, greater than 50% of which was counseling/coordinating care for ruptured ACOM aneurysm.

## 2023-06-03 NOTE — ED Provider Notes (Signed)
[facility] Emergency Department Provider Note MRN:  [id number]  Arrival date & time: 06/03/23     Chief Complaint   Headache   History of Present Illness   Tiffany [name] is a 37 y.o. year-old female presents to the ED with chief complaint of headache.  She came in with EMS with complaint of headache, but was also combative and verbally abusive.  She stated multiple times that she wanted to go home and kill herself.  Hx of polysubstance abuse.  Multiple opened alcohol bottles on scene.    She complains only of headache.  She states that she thinks she has an aneurysm and needs to see the doctor.  History provided by patient.   Review of Systems  Pertinent positive and negative review of systems noted in HPI.    Physical Exam   Vitals:   06/03/23 0500 06/03/23 0515  BP: (!) 139/96 (!) 136/99  Pulse: 73 77  Resp: (!) 21 (!) 21  Temp:    SpO2: 100% 100%    CONSTITUTIONAL:  agitated-appearing, NAD NEURO:  Alert and oriented x 3, CN 3-12 grossly intact EYES:  eyes equal and reactive ENT/NECK:  Supple, no stridor  CARDIO:  normal rate, regular rhythm, appears well-perfused  PULM:  No respiratory distress, CTAB GI/GU:  non-distended, non tender MSK/SPINE:  No gross deformities, no edema, moves all extremities SKIN:  no rash, atraumatic   *Additional and/or pertinent findings included in MDM below  Diagnostic and Interventional Summary    EKG Interpretation Date/Time:    Ventricular Rate:    PR Interval:    QRS Duration:    QT Interval:    QTC Calculation:   R Axis:      Text Interpretation:         Labs Reviewed  COMPREHENSIVE METABOLIC PANEL - Abnormal; Notable for the following components:      Result Value   Potassium 3.4 (*)    CO2 21 (*)    Calcium 8.8 (*)    All other components within normal limits  SALICYLATE LEVEL - Abnormal; Notable for the following components:   Salicylate Lvl <7.0 (*)    All other components  within normal limits  ACETAMINOPHEN LEVEL - Abnormal; Notable for the following components:   Acetaminophen (Tylenol), Serum <10 (*)    All other components within normal limits  CBC WITH DIFFERENTIAL/PLATELET - Abnormal; Notable for the following components:   Hemoglobin 11.7 (*)    RDW 19.8 (*)    All other components within normal limits  ETHANOL  HCG, SERUM, QUALITATIVE  RAPID URINE DRUG SCREEN, HOSP PERFORMED    CT ANGIO HEAD NECK W WO CM  Final Result  Addendum (preliminary) 1 of 1  ADDENDUM REPORT: 06/03/2023 05:06    ADDENDUM:  Study discussed by telephone with PA Jalien Weakland on 06/03/2023  at 0437 hours.      Electronically Signed    By: Odessa Fleming M.D.    On: 06/03/2023 05:06      Final    CT HEAD WO CONTRAST ( )  Final Result      Medications  clevidipine (CLEVIPREX) infusion 0.5 mg/mL (2 mg/hr Intravenous Rate/Dose Change 06/03/23 0421)  diphenhydrAMINE (BENADRYL) injection 50 mg (50 mg Intramuscular Given 06/03/23 0145)  LORazepam (ATIVAN) injection 2 mg (2 mg Intramuscular Given 06/03/23 0145)  haloperidol lactate (HALDOL) injection 5 mg (5 mg Intramuscular Given 06/03/23 0145)  iohexol (OMNIPAQUE) 350 MG/ML injection 60 mL (60 mLs Intravenous Contrast Given  06/03/23 0414)     Procedures  /  Critical Care .Critical Care  Performed by: Roxy Horseman, PA-C Authorized by: Roxy Horseman, PA-C   Critical care provider statement:    Critical care time (minutes):  77   Critical care was necessary to treat or prevent imminent or life-threatening deterioration of the following conditions:  CNS failure or compromise   Critical care was time spent personally by me on the following activities:  Development of treatment plan with patient or surrogate, discussions with consultants, evaluation of patient's response to treatment, examination of patient, ordering and review of laboratory studies, ordering and review of radiographic studies, ordering and  performing treatments and interventions, pulse oximetry, re-evaluation of patient's condition and review of old charts   ED Course and Medical Decision Making  I have reviewed the triage vital signs, the nursing notes, and pertinent available records from the EMR.  Social Determinants Affecting Complexity of Care: Patient suffers from drug abuse/addiction.   ED Course: Clinical Course as of 06/03/23 0535  Mon Jun 03, 2023  0145 Patient agitated, fighting, kicking, moving all extremities.  Clearly stating how she wants to go home and kill herself.  Dr. Madilyn Hook suggests Haldol for chemical restraint for staff and patient safety.  Will check labs and imaging.   [RB]  0206 Patient will require chemical restraints due to combativeness and threatening to get something sharp and kill herself.  She is also placed in soft restraints in the short term.   [RB]  [id number] Radiology called and notified me of head CT findings concerning for A-comm aneurysm and SAH.   [RB]  [id number] I consulted with Dr. Wilford Corner, neurology, who says poor quality CT and needs to be repeated along with CTA.  If truly an aneurysm, will need neurosurgery. [RB]  [id number] [employer] activated.  Patient seen by and discussed again with Dr. Madilyn Hook.  Patient protecting airway.  Will try for repeat CT, but may need to be paralyzed and intubated to facilitate a high quality CT. [RB]  0515 I consulted with Dr. Lovell Sheehan, from neurosurgery, who recommends consulting with Dr. Quay Burow who is on for aneurysmal [facility] call. [RB]  [id number] I consulted with Dr. Quay Burow, who is going to look at the images and get back to me. [RB]  0524 Dr. Quay Burow says that the aneurysm looks amenable to coiling and will plan to take patient as first case today. [RB]  [id number] I consulted with Dr. Lonzo Candy, intensivist, who is appreciated for admitting. [RB]    Clinical Course User Index [RB] Roxy Horseman, PA-C    Medical Decision Making Patient BIB EMS with complaint of  headache.  She is combative, agitated, and abusive.  Initially thought 2/2 ETOH due to multiple open alcohol bottles on scene vs substances due to hx of substance abuse.  She said multiple times that she wanted to go home and kill herself.  She only complains of headache and wanting to die.  Hx is difficult to obtain due to the agitation.  She is fighting with security and ultimately had to be physically and chemically restrained for her own and staff protection.    I saw the patient initially with Dr. Madilyn Hook, who suggested giving Haldol.  Patient responded well to this.  Labs and imaging are pending.  CT head worrisome for aneurysm and SAH.  Interestingly, patient said she thought she had an aneursym, but I don't see any history in my chart review.  Amount and/or Complexity of Data Reviewed Labs:  ordered. Radiology: ordered.  Risk Prescription drug management. Decision regarding hospitalization.         Consultants: Consults listed in ED course.   Treatment and Plan: Patient's exam and diagnostic results are concerning for aneurysm and SAH needing OR management for coiling.  Feel that patient will need admission to the hospital for further treatment and evaluation.    Final Clinical Impressions(s) / ED Diagnoses     ICD-10-CM   1. SAH (subarachnoid hemorrhage) (HCC)  I60.9     2. 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

## 2023-06-03 NOTE — Anesthesia Procedure Notes (Signed)
Arterial Line Insertion Performed by: Samara Deist, CRNA, CRNA  Patient location: OR. Preanesthetic checklist: patient identified, IV checked, site marked, risks and benefits discussed, surgical consent, monitors and equipment checked, pre-op evaluation, timeout performed and anesthesia consent Left, radial was placed Catheter size: 20 G Hand hygiene performed , maximum sterile barriers used  and Seldinger technique used Allen's test indicative of satisfactory collateral circulation Attempts: 1 Procedure performed without using ultrasound guided technique. Following insertion, Biopatch and dressing applied. Post procedure assessment: normal  Patient tolerated the procedure well with no immediate complications.

## 2023-06-03 NOTE — Transfer of Care (Signed)
Immediate Anesthesia Transfer of Care Note  Patient: Tiffany [name]  Procedure(s) Performed: IR WITH ANESTHESIA  Patient Location: ICU  Anesthesia Type:General  Level of Consciousness: sedated, unresponsive, and Patient remains intubated per anesthesia plan  Airway & Oxygen Therapy: Patient remains intubated per anesthesia plan and Patient placed on Ventilator (see vital sign flow sheet for setting)  Post-op Assessment: Report given to RN and Post -op Vital signs reviewed and stable  Post vital signs: Reviewed and stable  Last Vitals:  Vitals Value Taken Time  BP 220/131 06/03/23 1225  Temp    Pulse 86 06/03/23 1233  Resp 16 06/03/23 1234  SpO2 100 % 06/03/23 1233  Vitals shown include unfiled device data.  Last Pain:  Vitals:   06/03/23 0646  TempSrc: Temporal  PainSc:          Complications: No notable events documented.

## 2023-06-03 NOTE — Anesthesia Procedure Notes (Signed)
Procedure Name: Intubation Date/Time: 06/03/2023 9:23 AM  Performed by: Marena Chancy, CRNAPre-anesthesia Checklist: Patient identified, Emergency Drugs available, Suction available and Patient being monitored Patient Re-evaluated:Patient Re-evaluated prior to induction Oxygen Delivery Method: Circle System Utilized Preoxygenation: Pre-oxygenation with 100% oxygen Induction Type: IV induction, Cricoid Pressure applied and Rapid sequence Laryngoscope Size: Miller and 2 Grade View: Grade II Tube type: Oral Tube size: 7.0 mm Number of attempts: 1 Airway Equipment and Method: Stylet and Oral airway Placement Confirmation: ETT inserted through vocal cords under direct vision, positive ETCO2 and breath sounds checked- equal and bilateral Tube secured with: Tape Dental Injury: Teeth and Oropharynx as per pre-operative assessment

## 2023-06-03 NOTE — Sedation Documentation (Signed)
Patient transported to [facility] [place] ICU with CRNA Ian Malkin and Pharmacist, hospital. Fleet Contras RN and [facility] [employer] staff at the bedside to receive patient handoff. Right groin site intact. Clean, dry and intact. No drainage noted. Soft to palpation, no hematoma noted. +2 palpable distal pulses intact.

## 2023-06-03 NOTE — ED Notes (Signed)
Bilateral soft wrist/ankle restraints discontinued by Dr. Madilyn Hook.

## 2023-06-03 NOTE — Anesthesia Preprocedure Evaluation (Signed)
Anesthesia Evaluation  Patient identified by MRN, date of birth, ID band Patient confused    Reviewed: Unable to perform ROS - Chart review onlyPreop documentation limited or incomplete due to emergent nature of procedure.  Airway Mallampati: II  TM Distance: >3 FB Neck ROM: Full    Dental no notable dental hx. (+) Teeth Intact   Pulmonary neg pulmonary ROS, Current Smoker   Pulmonary exam normal breath sounds clear to auscultation       Cardiovascular hypertension, Normal cardiovascular exam Rhythm:Irregular Rate:Abnormal     Neuro/Psych CVA    GI/Hepatic ,,,(+)     substance abuse  cocaine use and marijuana use  Endo/Other    Renal/GU      Musculoskeletal   Abdominal   Peds  Hematology   Anesthesia Other Findings  IR consulted for angiogram with aneurysm embolization.     Reproductive/Obstetrics                              Anesthesia Physical Anesthesia Plan  ASA: 2 and emergent  Anesthesia Plan: General   Post-op Pain Management:    Induction: Rapid sequence, Intravenous and Cricoid pressure planned  PONV Risk Score and Plan: 3 and Treatment may vary due to age or medical condition and Ondansetron  Airway Management Planned: Oral ETT  Additional Equipment: Arterial line  Intra-op Plan:   Post-operative Plan: Post-operative intubation/ventilation  Informed Consent:      Dental advisory given and Only emergency history available  Plan Discussed with: CRNA, Anesthesiologist and Surgeon  Anesthesia Plan Comments: (37 y.o. female with past medical history of polysubstance abuse admitted with severe headache, confusion, agitation, combativeness.  Initially she was thought to have agitation related to substance abuse and was given haldol and ativan in the ED. Subsequent CT Head showed a bleeding A-comm aneurysm with small bilateral frontal subarachnoid hemorrhage.  . IR  consulted for angiogram with aneurysm embolization.  )         Anesthesia Quick Evaluation

## 2023-06-03 NOTE — Sedation Documentation (Signed)
Final ACT 187
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
## 2023-06-03 NOTE — ED Notes (Signed)
Patient moved to 4N rn devin is taking paperwork up to the floor

## 2023-06-03 NOTE — Sedation Documentation (Signed)
Baseline ACT 147

## 2023-06-03 NOTE — Discharge Instructions (Signed)

## 2023-06-03 NOTE — Consult Note (Addendum)
Reason for Consult: Aneurysmal subarachnoid hemorrhage Referring Physician: Dr. Quentin Ore is an 37 y.o. female.  HPI: The patient is a hypertensive 37 year old black female with a history of cocaine marijuana and alcohol abuse who by report began having a severe headache this morning.  She called EMS and was brought to [facility].  By report upon arrival to the hospital she was quite combative and abusive she was given Haldol and started on Cleviprex.  A head CT was obtained which demonstrated a subarachnoid hemorrhage with a small hemorrhage in the gyrus rectus consistent with a comm aneurysm.  This was confirmed with the CTA.  I was contacted and recommended the patient be admitted to the ICU by Dr.  Lonzo Candy.  Dr. Marijo Conception Rodriques was contacted and felt the aneurysm was amenable to coiling.  Presently the patient is sedated and in no apparent distress.   Past Medical History:  Diagnosis Date   Addiction, marijuana (HCC)    Alcohol abuse    Chlamydia    Cocaine addiction (HCC)    Depression    History of concussion    Hypertension    does not take any meds    Past Surgical History:  Procedure Laterality Date   WISDOM TOOTH EXTRACTION  2007    Family History  Problem Relation Age of Onset   Hypertension Other    Diabetes Other    Heart failure Mother    Hypertension Mother    Cervical cancer Paternal Aunt     Social History:  reports that she has been smoking cigarettes. She has a 9 pack-year smoking history. She has never used smokeless tobacco. She reports current alcohol use of about 13.0 standard drinks of alcohol per week. She reports current drug use. Frequency: 1.00 time per week. Drugs: Marijuana and Cocaine.  Allergies:  Allergies  Allergen Reactions   Shellfish Allergy Anaphylaxis   Pollen Extract    Aspirin Palpitations    Medications: I have reviewed the patient's current medications. Prior to Admission: (Not in a hospital  admission)  Scheduled:  docusate sodium  100 mg Oral BID   folic acid  1 mg Intravenous Daily   multivitamin with minerals  1 tablet Per Tube Daily   thiamine (VITAMIN B1) injection  100 mg Intravenous Daily   Continuous:  sodium chloride 75 mL/hr at 06/03/23 0618   clevidipine Stopped (06/03/23 0544)   potassium chloride 10 mEq (06/03/23 8295)   AOZ:HYQMVHQIONGEX, polyethylene glycol Anti-infectives (From admission, onward)    None        Results for orders placed or performed during the hospital encounter of 06/03/23 (from the past 48 hours)  Comprehensive metabolic panel     Status: Abnormal   Collection Time: 06/03/23  1:48 AM  Result Value Ref Range   Sodium 138 135 - 145 mmol/L   Potassium 3.4 (L) 3.5 - 5.1 mmol/L   Chloride 108 98 - 111 mmol/L   CO2 21 (L) 22 - 32 mmol/L   Glucose, Bld 95 70 - 99 mg/dL    Comment: Glucose reference range applies only to samples taken after fasting for at least 8 hours.   BUN 11 6 - 20 mg/dL   Creatinine, Ser 5.28 0.44 - 1.00 mg/dL   Calcium 8.8 (L) 8.9 - 10.3 mg/dL   Total Protein 7.0 6.5 - 8.1 g/dL   Albumin 3.8 3.5 - 5.0 g/dL   AST 20 15 - 41 U/L   ALT 10 0 -  44 U/L   Alkaline Phosphatase 60 38 - 126 U/L   Total Bilirubin 0.4 <1.2 mg/dL   GFR, Estimated >98 >11 mL/min    Comment: (NOTE) Calculated using the CKD-EPI Creatinine Equation (2021)    Anion gap 9 5 - 15    Comment: Performed at [facility], [address], [place], [place] [address]  Salicylate level     Status: Abnormal   Collection Time: 06/03/23  1:48 AM  Result Value Ref Range   Salicylate Lvl <7.0 (L) 7.0 - 30.0 mg/dL    Comment: Performed at [facility], [address] [place], [place] [address]  Acetaminophen level     Status: Abnormal   Collection Time: 06/03/23  1:48 AM  Result Value Ref Range   Acetaminophen (Tylenol), Serum <10 (L) 10 - 30 ug/mL    Comment: (NOTE) Therapeutic concentrations vary significantly. A range of 10-30 ug/mL   may be an effective concentration for many patients. However, some  are best treated at concentrations outside of this range. Acetaminophen concentrations >150 ug/mL at 4 hours after ingestion  and >50 ug/mL at 12 hours after ingestion are often associated with  toxic reactions.  Performed at [facility], [address] [place], [place] [address]   Ethanol     Status: None   Collection Time: 06/03/23  1:48 AM  Result Value Ref Range   Alcohol, Ethyl (B) <10 <10 mg/dL    Comment: (NOTE) Lowest detectable limit for serum alcohol is 10 mg/dL.  For medical purposes only. Performed at [facility], [address] [place], [place] [address]   CBC WITH DIFFERENTIAL     Status: Abnormal   Collection Time: 06/03/23  1:48 AM  Result Value Ref Range   WBC 7.1 4.0 - 10.5 K/uL   RBC 4.17 3.87 - 5.11 MIL/uL   Hemoglobin 11.7 (L) 12.0 - 15.0 g/dL   HCT 96.2 95.2 - 84.1 %   MCV 87.3 80.0 - 100.0 fL   MCH 28.1 26.0 - 34.0 pg   MCHC 32.1 30.0 - 36.0 g/dL   RDW 32.4 (H) 40.1 - 02.7 %   Platelets 387 150 - 400 K/uL   nRBC 0.0 0.0 - 0.2 %   Neutrophils Relative % 62 %   Neutro Abs 4.4 1.7 - 7.7 K/uL   Lymphocytes Relative 26 %   Lymphs Abs 1.8 0.7 - 4.0 K/uL   Monocytes Relative 10 %   Monocytes Absolute 0.7 0.1 - 1.0 K/uL   Eosinophils Relative 1 %   Eosinophils Absolute 0.1 0.0 - 0.5 K/uL   Basophils Relative 1 %   Basophils Absolute 0.1 0.0 - 0.1 K/uL   Immature Granulocytes 0 %   Abs Immature Granulocytes 0.01 0.00 - 0.07 K/uL    Comment: Performed at [facility], [address] [place], [place] [address]  hCG, serum, qualitative     Status: None   Collection Time: 06/03/23  1:48 AM  Result Value Ref Range   Preg, Serum NEGATIVE NEGATIVE    Comment:        THE SENSITIVITY OF THIS METHODOLOGY IS >10 mIU/mL. Performed at [facility], [address], [place], [place] [address]   Urine rapid drug screen (hosp performed)     Status: Abnormal    Collection Time: 06/03/23  5:40 AM  Result Value Ref Range   Opiates NONE DETECTED NONE DETECTED   Cocaine POSITIVE (A) NONE DETECTED  Benzodiazepines NONE DETECTED NONE DETECTED   Amphetamines NONE DETECTED NONE DETECTED   Tetrahydrocannabinol NONE DETECTED NONE DETECTED   Barbiturates NONE DETECTED NONE DETECTED    Comment: (NOTE) DRUG SCREEN FOR MEDICAL PURPOSES ONLY.  IF CONFIRMATION IS NEEDED FOR ANY PURPOSE, NOTIFY LAB WITHIN 5 DAYS.  LOWEST DETECTABLE LIMITS FOR URINE DRUG SCREEN Drug Class                     Cutoff (ng/mL) Amphetamine and metabolites    1000 Barbiturate and metabolites    200 Benzodiazepine                 200 Opiates and metabolites        300 Cocaine and metabolites        300 THC                            50 Performed at [facility], [address] [place], [place] [address]     CT [facility] HEAD NECK W WO CM Addendum Date: 06/03/2023 ADDENDUM REPORT: 06/03/2023 05:06 ADDENDUM: Study discussed by telephone with PA ROBERT BROWNING on 06/03/2023 at 0437 hours. Electronically Signed   By: Odessa Fleming M.D.   On: 06/03/2023 05:06   Result Date: 06/03/2023 CLINICAL DATA:  37 year old female with altered mental status. Subarachnoid hemorrhage suspicious for ruptured intracranial aneurysm. EXAM: CT ANGIOGRAPHY HEAD AND NECK WITH AND WITHOUT CONTRAST TECHNIQUE: Multidetector CT imaging of the head and neck was performed using the standard protocol during bolus administration of intravenous contrast. Multiplanar CT image reconstructions and MIPs were obtained to evaluate the vascular anatomy. Carotid stenosis measurements (when applicable) are obtained utilizing NASCET criteria, using the distal internal carotid diameter as the denominator. RADIATION DOSE REDUCTION: This exam was performed according to the departmental dose-optimization program which includes automated exposure control, adjustment of the mA and/or kV according to patient size and/or use of  iterative reconstruction technique. CONTRAST:  60mL OMNIPAQUE IOHEXOL 350 MG/ML SOLN COMPARISON:  Head CT 0333 hours today. FINDINGS: CT HEAD Brain: Stable 16 mm area of what appears to be primarily extra-axial hemorrhage in the interhemispheric fissure anterior to the 3rd ventricle (series 5, image 21) and a small amount of additional anterior inter hemispheric subarachnoid blood. Underlying normal cerebral volume. No ventriculomegaly. No IVH is visible. Small volume of additional bilateral basilar cistern blood is suspected, such as elsewhere in the suprasellar and sylvian fissures, stable. No discrete cerebral edema or cortically based infarct identified. Pre medullary cistern and cisterna magna remain normal. Calvarium and skull base: Intact, negative. Paranasal sinuses: Visualized paranasal sinuses and mastoids are stable and well aerated. Orbits: Visualized orbits and scalp soft tissues are within normal limits. CTA NECK Skeleton: No acute osseous abnormality identified. Age advanced cervical disc bulging and endplate spurring. Upper chest: Negative.  Visible upper lungs are clear. Other neck: Negative. Aortic arch: 3 vessel arch.  No arch atherosclerosis. Right carotid system: Negative. Left carotid system: Negative. Vertebral arteries: Mild calcified plaque at the right subclavian artery origin. No stenosis. Normal right vertebral artery origin. Right vertebral artery is patent and within normal limits to the skull base. Proximal left subclavian artery and left vertebral artery origin are normal. Left vertebral artery is codominant, patent within normal limits to the skull base. CTA HEAD Posterior circulation: Codominant distal vertebral arteries, PICA origins, vertebrobasilar junction appear normal. Patent basilar artery without stenosis. Basilar tip, SCA and PCA origins are  within normal limits. Diminutive posterior communicating arteries are present. Bilateral PCA branches are within normal limits.  Anterior circulation: Both ICA siphons are patent no discrete siphon plaque, but diminutive appearance of the supraclinoid ICAs probably reflects a degree of vaso spasm. MCA and ACA origins are patent but also diminutive. Anteriorly and superiorly directed saccular aneurysm arises from the anterior communicating artery at the junction with the left ACA (series 9, image 103, series 13, image 88). It is up to 4 mm in length. The tip is bulbous, about 3 mm and mildly irregular. This is directed slightly to the left of midline. Both ACA A2 segments remain patent. Other ACA branches are within normal limits. Left MCA M1 and bifurcation are patent. Right MCA M1 and bifurcation are patent. No MCA aneurysm. Bilateral MCA branches are within normal limits. Venous sinuses: Patent. Anatomic variants: None. Review of the MIP images confirms the above findings IMPRESSION: 1. 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 2. Mild intracranial artery Vasospasm. No large vessel occlusion. No 2nd aneurysm identified. No significant atherosclerosis in the head or neck. 3. Stable small volume acute intracranial hemorrhage from #1. No IVH, ventriculomegaly, midline shift. Electronically Signed: By: Odessa Fleming M.D. On: 06/03/2023 04:37   CT HEAD WO CONTRAST ( ) Result Date: 06/03/2023 CLINICAL DATA:  Mental status change, unknown cause EXAM: CT HEAD WITHOUT CONTRAST TECHNIQUE: Contiguous axial images were obtained from the base of the skull through the vertex without intravenous contrast. RADIATION DOSE REDUCTION: This exam was performed according to the departmental dose-optimization program which includes automated exposure control, adjustment of the mA and/or kV according to patient size and/or use of iterative reconstruction technique. COMPARISON:  CT head 11/04/2011 FINDINGS: Brain: No evidence of  large-territorial acute infarction. No parenchymal hemorrhage. No mass lesion. Parafalcine curvilinear and likely fall or hyperdensity measuring up to 17 mm with associated adjacent bilateral small volume inferior frontal subarachnoid hemorrhage (5:44, 3:22). No hydrocephalus. Basilar cisterns are patent. Vascular: No hyperdense vessel. Skull: No acute fracture or focal lesion. Sinuses/Orbits: Paranasal sinuses and mastoid air cells are clear. The orbits are unremarkable. Other: None. IMPRESSION: Findings suggestive of a bleeding A-comm aneurysm with small bilateral frontal subarachnoid hemorrhage. These results were called by telephone at the time of interpretation on 06/03/2023 at 3:47 am to provider Roxy Horseman , who verbally acknowledged these results. Electronically Signed   By: Tish Frederickson M.D.   On: 06/03/2023 03:47    ROS: Unobtainable Blood pressure 124/85, pulse 76, temperature 98.4 F (36.9 C), temperature source Temporal, resp. rate (!) 22, SpO2 100%, unknown if currently breastfeeding. Estimated body mass index is 20.98 kg/m as calculated from the following:   Height as of 04/03/23: 5\' 6"  (1.676 m).   Weight as of 04/03/23: 59 kg.  Physical Exam  General: A thin sedated 37 year old black female in no apparent distress  HEENT: Her pupils are equal and reactive.  She has conjugate gaze.  Neck: Unremarkable  Thorax: Symmetric  Abdomen: Soft  Extremities: Unremarkable  Neurologic exam: Glascow coma scale 9, sedated, E2M5V2.  Her pupils are equal as above.  She moves all 4 extremities.  Assessment/Plan: Aneurysmal subarachnoid hemorrhage, ACOM aneurysm, polysubstance abuse: The plan is for coiling of her aneurysm.  We will continue to control her blood pressure with Cleviprex.  Cristi Loron 06/03/2023, 6:51 AM

## 2023-06-03 NOTE — ED Notes (Signed)
Patient attempted to leave , assisted back to bed by staff , EDP notified .

## 2023-06-03 NOTE — ED Notes (Signed)
Transported back to CT scan .

## 2023-06-03 NOTE — ED Notes (Signed)
IVC in process  

## 2023-06-03 NOTE — Progress Notes (Signed)
CSW added substance abuse resources to patient's AVS.  Burnham Trost, MSW, LCSW Transitions of Care  Clinical Social Worker II [phone]  
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
## 2023-06-03 NOTE — Progress Notes (Signed)
PCCM Interval Progress Note  Pt with A-comm SAH. Seen by NSGY and IR with recommendations for emergent cerebral angiogram and coil embolization.  Unfortunately pt is altered and unable to provide consent. IR has attempted to call family without success.  Contacted by IR and agree that pt needs emergent intervention. OK from PCCM standpoint to proceed with IR angiogram and possible coil embolization under emergent indication.   Discussed with IR team.   Rutherford Guys, PA - [facility] For pager details, please see AMION or use Epic chat  After 1900, please call [facility] for cross coverage needs 06/03/2023, 9:19 AM

## 2023-06-03 NOTE — Anesthesia Postprocedure Evaluation (Signed)
Anesthesia Post Note  Patient: Tiffany [name]  Procedure(s) Performed: IR WITH ANESTHESIA     Patient location during evaluation: ICU Anesthesia Type: General Level of consciousness: sedated Pain management: pain level controlled Vital Signs Assessment: post-procedure vital signs reviewed and stable Respiratory status: patient on ventilator - see flowsheet for VS Cardiovascular status: stable Anesthetic complications: no   No notable events documented.  Last Vitals:  Vitals:   06/03/23 1226 06/03/23 1230  BP:  (!) 173/87  Pulse: 65 76  Resp: (!) 25 16  Temp:    SpO2: 100% 100%    Last Pain:  Vitals:   06/03/23 1225  TempSrc: Axillary  PainSc:                  Linton Rump

## 2023-06-03 NOTE — ED Notes (Signed)
Patient IVC completed upon my arrival and was attached to clipboard in orange zone case #[id number] EXP 06/09/22

## 2023-06-03 NOTE — ED Notes (Addendum)
Pt stated "I'm going to go home and kill myself." After repeating "I wish I never had kids. This world is so cruel.". RN made aware.

## 2023-06-03 NOTE — Sedation Documentation (Signed)
Perclose deployed right groin 

## 2023-06-03 NOTE — ED Notes (Signed)
Returned from CT scan , Cleviprex infusing at 2 mg/hr.

## 2023-06-04 ENCOUNTER — Encounter (HOSPITAL_COMMUNITY): Payer: Self-pay | Admitting: Neuroradiology

## 2023-06-04 ENCOUNTER — Other Ambulatory Visit (HOSPITAL_COMMUNITY): Payer: Self-pay | Admitting: Radiology

## 2023-06-04 ENCOUNTER — Inpatient Hospital Stay (HOSPITAL_COMMUNITY): Payer: BLUE CROSS/BLUE SHIELD

## 2023-06-04 DIAGNOSIS — I609 Nontraumatic subarachnoid hemorrhage, unspecified: Secondary | ICD-10-CM | POA: Diagnosis not present

## 2023-06-04 LAB — BASIC METABOLIC PANEL
Anion gap: 9 (ref 5–15)
BUN: 8 mg/dL (ref 6–20)
CO2: 22 mmol/L (ref 22–32)
Calcium: 8.4 mg/dL — ABNORMAL LOW (ref 8.9–10.3)
Chloride: 107 mmol/L (ref 98–111)
Creatinine, Ser: 0.75 mg/dL (ref 0.44–1.00)
GFR, Estimated: >60 mL/min (ref >60)
Glucose, Bld: 99 mg/dL (ref 70–99)
Potassium: 3.6 mmol/L (ref 3.5–5.1)
Sodium: 138 mmol/L (ref 135–145)

## 2023-06-04 LAB — CBC
HCT: 33.4 % — ABNORMAL LOW (ref 36.0–46.0)
Hemoglobin: 10.7 g/dL — ABNORMAL LOW (ref 12.0–15.0)
MCH: 27.8 pg (ref 26.0–34.0)
MCHC: 32 g/dL (ref 30.0–36.0)
MCV: 86.8 fL (ref 80.0–100.0)
Platelets: 379 10*3/uL (ref 150–400)
RBC: 3.85 MIL/uL — ABNORMAL LOW (ref 3.87–5.11)
RDW: 20.2 % — ABNORMAL HIGH (ref 11.5–15.5)
WBC: 7.9 10*3/uL (ref 4.0–10.5)
nRBC: 0 % (ref 0.0–0.2)

## 2023-06-04 LAB — MAGNESIUM: Magnesium: 2.4 mg/dL (ref 1.7–2.4)

## 2023-06-04 LAB — GLUCOSE, CAPILLARY
Glucose-Capillary: 102 mg/dL — ABNORMAL HIGH (ref 70–99)
Glucose-Capillary: 92 mg/dL (ref 70–99)
Glucose-Capillary: 114 mg/dL — ABNORMAL HIGH (ref 70–99)
Glucose-Capillary: 123 mg/dL — ABNORMAL HIGH (ref 70–99)

## 2023-06-04 LAB — MRSA NEXT GEN BY PCR, NASAL: MRSA by PCR Next Gen: NOT DETECTED

## 2023-06-04 LAB — PHOSPHORUS: Phosphorus: 4.1 mg/dL (ref 2.5–4.6)

## 2023-06-04 LAB — TRIGLYCERIDES: Triglycerides: 94 mg/dL (ref <150)

## 2023-06-04 MED ORDER — ACETAMINOPHEN 325 MG PO TABS
650.0000 mg | ORAL_TABLET | ORAL | Status: DC | PRN
  Administered 2023-06-04 – 2023-06-13 (×25): 650 mg via ORAL
  Filled 2023-06-04 (×26): qty 2

## 2023-06-04 MED ORDER — THIAMINE MONONITRATE 100 MG PO TABS: 100.0000 mg | ORAL_TABLET | Freq: Every day | ORAL | Status: DC

## 2023-06-04 MED ORDER — NIMODIPINE 6 MG/ML PO SOLN
60.0000 mg | ORAL | Status: DC
  Administered 2023-06-04 – 2023-06-05 (×4): 60 mg via ORAL
  Filled 2023-06-04 (×4): qty 10

## 2023-06-04 MED ORDER — LORAZEPAM 1 MG PO TABS
1.0000 mg | ORAL_TABLET | ORAL | Status: AC | PRN
Start: 2023-06-04 — End: 2023-06-07

## 2023-06-04 MED ORDER — FOLIC ACID 1 MG PO TABS
1.0000 mg | ORAL_TABLET | Freq: Every day | ORAL | Status: AC
  Administered 2023-06-05 – 2023-06-07 (×3): 1 mg via ORAL
  Filled 2023-06-04 (×3): qty 1

## 2023-06-04 MED ORDER — HALOPERIDOL LACTATE 5 MG/ML IJ SOLN
5.0000 mg | Freq: Three times a day (TID) | INTRAMUSCULAR | Status: DC | PRN
  Administered 2023-06-08 – 2023-06-13 (×2): 5 mg via INTRAMUSCULAR
  Filled 2023-06-04 (×3): qty 1

## 2023-06-04 MED ORDER — LORAZEPAM 2 MG/ML IJ SOLN: 1.0000 mg | INTRAMUSCULAR | Status: AC | PRN

## 2023-06-04 MED ORDER — HALOPERIDOL 0.5 MG PO TABS
5.0000 mg | ORAL_TABLET | Freq: Three times a day (TID) | ORAL | Status: DC | PRN
  Administered 2023-06-09 – 2023-06-11 (×2): 5 mg via ORAL
  Filled 2023-06-04: qty 1
  Filled 2023-06-04: qty 10
  Filled 2023-06-04: qty 1

## 2023-06-04 MED ORDER — ORAL CARE MOUTH RINSE
15.0000 mL | OROMUCOSAL | Status: DC
  Administered 2023-06-04 – 2023-06-05 (×4): 15 mL via OROMUCOSAL

## 2023-06-04 MED ORDER — THIAMINE HCL 100 MG/ML IJ SOLN
200.0000 mg | Freq: Every day | INTRAVENOUS | Status: DC
  Administered 2023-06-04 – 2023-06-05 (×2): 200 mg via INTRAVENOUS
  Filled 2023-06-04 (×3): qty 2
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
## 2023-06-04 NOTE — Procedures (Signed)
 Extubation Procedure Note  Patient Details:   Name: Tiffany [name] DOB: 1985-09-30 MRN: [id number]   Airway Documentation:    Vent end date: 06/04/23 Vent end time: 1250   Evaluation  O2 sats: stable throughout Complications: No apparent complications Patient did tolerate procedure well. Bilateral Breath Sounds: Clear, Diminished   Yes  POsitive cuff leak noted. Patient placed on Energy 4L with humidity, no stridor noted. Patient able to reach 1375 mL using the incentive spirometer.  Cy GORMAN Pan 06/04/2023, 12:59 PM

## 2023-06-04 NOTE — Progress Notes (Signed)
 No new issues or problems.  Patient remains on ventilator with some sedation ongoing.  Afebrile.  Blood pressure well-controlled.  Mild bradycardia.  Patient is awake and aware.  She follows commands with all 4 extremities.  Strength equal bilaterally.  Pupils equal and reactive bilaterally.  Chest and abdomen benign.  Status post ruptured ACOM aneurysm with subsequent coiling.  Patient okay to wean towards extubation from our standpoint.  Continue IV fluids and ICU observation.

## 2023-06-04 NOTE — Progress Notes (Signed)
Pt noted to have HR in 50s, Dr. Inis Sizer aware at bedside with visual. Prop decreased.

## 2023-06-04 NOTE — Plan of Care (Signed)
  Problem: Nutrition: Goal: Adequate nutrition will be maintained Outcome: Progressing   Problem: Activity: Goal: Ability to tolerate increased activity will improve Outcome: Not Met (add Reason)   Problem: Respiratory: Goal: Ability to maintain a clear airway and adequate ventilation will improve Outcome: Not Met (add Reason)   Problem: Role Relationship: Goal: Method of communication will improve Outcome: Not Met (add Reason)   Problem: Education: Goal: Understanding of CV disease, CV risk reduction, and recovery process will improve Outcome: Progressing   Problem: Activity: Goal: Ability to return to baseline activity level will improve Outcome: Progressing   Problem: Safety: Goal: Non-violent Restraint(s) Outcome: Completed/Met

## 2023-06-04 NOTE — Progress Notes (Signed)
SBP parameters confirmed at 1810 (120-140).

## 2023-06-04 NOTE — Progress Notes (Signed)
Pt CBG checked by this RN by error d/t pt being NPO. This explained to family, they verbalized an understanding.

## 2023-06-04 NOTE — Progress Notes (Signed)
 Referring Physician(s): Dr. 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 S/p  Primary coiling embolization by Dr. MARLA. de Nile Erichsen  Subjective:  Unable to assess. Patient remains on the ventilator sedated  Allergies: Shellfish allergy, Pollen extract, and Aspirin  Medications: Prior to Admission medications   Medication Sig Start Date End Date Taking? Authorizing Provider  ibuprofen  (ADVIL ) 200 MG tablet Take 400 mg by mouth every 6 (six) hours as needed for headache or mild pain.    [provider]  ibuprofen  (ADVIL ) 600 MG tablet Take 1 tablet (600 mg total) by mouth every 6 (six) hours as needed. 04/03/23   Dean Clarity, MD  methocarbamol  (ROBAXIN ) 500 MG tablet Take 1 tablet (500 mg total) by mouth 2 (two) times daily as needed for muscle spasms. 04/03/23   Dean Clarity, MD  traZODone  (DESYREL ) 50 MG tablet Take 1 tablet (50 mg total) by mouth at bedtime. 09/13/22   Dasie Ellouise CROME, FNP     Vital Signs: BP 138/84   Pulse 69   Temp 99.5 F (37.5 C) (Axillary)   Resp 11   Ht 5' 5.98 (1.676 m)   Wt 123 lb 14.4 oz (56.2 kg)   SpO2 99%   BMI 20.01 kg/m   Physical Exam Vitals and nursing note reviewed.  Constitutional:      Appearance: She is well-developed.  HENT:     Head: Normocephalic and atraumatic.  Cardiovascular:     Rate and Rhythm: Regular rhythm.     Comments: Right groin access site is soft with no active bleeding and no appreciable pseudoaneurysm. Dressing is C/D/I  Pulmonary:     Comments:  On the ventilator. Sedated Musculoskeletal:        General: Normal range of motion.  Skin:    General: Skin is warm and dry.     Imaging: Portable Chest xray Result Date: 06/04/2023 CLINICAL DATA:  Respiratory failure. EXAM: PORTABLE CHEST 1 VIEW  COMPARISON:  January 27, 2021. FINDINGS: The heart size and mediastinal contours are within normal limits. Endotracheal and nasogastric tubes are in grossly good position. Both lungs are clear. The visualized skeletal structures are unremarkable. IMPRESSION: No active disease. Electronically Signed   By: Lynwood Landy Raddle M.D.   On: 06/04/2023 10:08   DG Abd Portable 1V Result Date: 06/03/2023 CLINICAL DATA:  Orogastric tube placement. EXAM: PORTABLE ABDOMEN - 1 VIEW COMPARISON:  None Available. FINDINGS: Tip and side port of the enteric tube below the diaphragm in the stomach. There is mild gaseous distention of stomach and small bowel centrally. No evidence of free air on this portable supine view. IMPRESSION: Tip and side port of the enteric tube below the diaphragm in the stomach. Electronically Signed   By: Andrea Gasman M.D.   On: 06/03/2023 18:29   CT ANGIO HEAD NECK W WO CM Addendum Date: 06/03/2023 ADDENDUM REPORT: 06/03/2023 05:06 ADDENDUM: Study discussed by telephone with PA ROBERT BROWNING on 06/03/2023 at 0437 hours. Electronically Signed   By: VEAR Hurst M.D.   On: 06/03/2023 05:06   Result Date: 06/03/2023 CLINICAL DATA:  37 year old female with altered mental status. Subarachnoid hemorrhage suspicious for ruptured intracranial aneurysm. EXAM: CT ANGIOGRAPHY HEAD AND NECK WITH AND WITHOUT CONTRAST TECHNIQUE: Multidetector CT imaging of the head and neck was performed using the standard  protocol during bolus administration of intravenous contrast. Multiplanar CT image reconstructions and MIPs were obtained to evaluate the vascular anatomy. Carotid stenosis measurements (when applicable) are obtained utilizing NASCET criteria, using the distal internal carotid diameter as the denominator. RADIATION DOSE REDUCTION: This exam was performed according to the departmental dose-optimization program which includes automated exposure control, adjustment of the mA and/or kV according to patient size  and/or use of iterative reconstruction technique. CONTRAST:  60mL OMNIPAQUE  IOHEXOL  350 MG/ML SOLN COMPARISON:  Head CT 0333 hours today. FINDINGS: CT HEAD Brain: Stable 16 mm area of what appears to be primarily extra-axial hemorrhage in the interhemispheric fissure anterior to the 3rd ventricle (series 5, image 21) and a small amount of additional anterior inter hemispheric subarachnoid blood. Underlying normal cerebral volume. No ventriculomegaly. No IVH is visible. Small volume of additional bilateral basilar cistern blood is suspected, such as elsewhere in the suprasellar and sylvian fissures, stable. No discrete cerebral edema or cortically based infarct identified. Pre medullary cistern and cisterna magna remain normal. Calvarium and skull base: Intact, negative. Paranasal sinuses: Visualized paranasal sinuses and mastoids are stable and well aerated. Orbits: Visualized orbits and scalp soft tissues are within normal limits. CTA NECK Skeleton: No acute osseous abnormality identified. Age advanced cervical disc bulging and endplate spurring. Upper chest: Negative.  Visible upper lungs are clear. Other neck: Negative. Aortic arch: 3 vessel arch.  No arch atherosclerosis. Right carotid system: Negative. Left carotid system: Negative. Vertebral arteries: Mild calcified plaque at the right subclavian artery origin. No stenosis. Normal right vertebral artery origin. Right vertebral artery is patent and within normal limits to the skull base. Proximal left subclavian artery and left vertebral artery origin are normal. Left vertebral artery is codominant, patent within normal limits to the skull base. CTA HEAD Posterior circulation: Codominant distal vertebral arteries, PICA origins, vertebrobasilar junction appear normal. Patent basilar artery without stenosis. Basilar tip, SCA and PCA origins are within normal limits. Diminutive posterior communicating arteries are present. Bilateral PCA branches are within normal  limits. Anterior circulation: Both ICA siphons are patent no discrete siphon plaque, but diminutive appearance of the supraclinoid ICAs probably reflects a degree of vaso spasm. MCA and ACA origins are patent but also diminutive. Anteriorly and superiorly directed saccular aneurysm arises from the anterior communicating artery at the junction with the left ACA (series 9, image 103, series 13, image 88). It is up to 4 mm in length. The tip is bulbous, about 3 mm and mildly irregular. This is directed slightly to the left of midline. Both ACA A2 segments remain patent. Other ACA branches are within normal limits. Left MCA M1 and bifurcation are patent. Right MCA M1 and bifurcation are patent. No MCA aneurysm. Bilateral MCA branches are within normal limits. Venous sinuses: Patent. Anatomic variants: None. Review of the MIP images confirms the above findings IMPRESSION: 1. 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 2. Mild intracranial artery Vasospasm. No large vessel occlusion. No 2nd aneurysm identified. No significant atherosclerosis in the head or neck. 3. Stable small volume acute intracranial hemorrhage from #1. No IVH, ventriculomegaly, midline shift. Electronically Signed: By: VEAR Hurst M.D. On: 06/03/2023 04:37   CT HEAD WO CONTRAST ( ) Result Date: 06/03/2023 CLINICAL DATA:  Mental status change, unknown cause EXAM: CT HEAD WITHOUT CONTRAST TECHNIQUE: Contiguous axial images were obtained from the base of the skull through the vertex without  intravenous contrast. RADIATION DOSE REDUCTION: This exam was performed according to the departmental dose-optimization program which includes automated exposure control, adjustment of the mA and/or kV according to patient size and/or use of iterative reconstruction technique. COMPARISON:  CT head 11/04/2011 FINDINGS: Brain: No  evidence of large-territorial acute infarction. No parenchymal hemorrhage. No mass lesion. Parafalcine curvilinear and likely fall or hyperdensity measuring up to 17 mm with associated adjacent bilateral small volume inferior frontal subarachnoid hemorrhage (5:44, 3:22). No hydrocephalus. Basilar cisterns are patent. Vascular: No hyperdense vessel. Skull: No acute fracture or focal lesion. Sinuses/Orbits: Paranasal sinuses and mastoid air cells are clear. The orbits are unremarkable. Other: None. IMPRESSION: Findings suggestive of a bleeding A-comm aneurysm with small bilateral frontal subarachnoid hemorrhage. These results were called by telephone at the time of interpretation on 06/03/2023 at 3:47 am to provider LAMAR SCHLOSSMAN , who verbally acknowledged these results. Electronically Signed   By: Morgane  Naveau M.D.   On: 06/03/2023 03:47    Labs:  CBC: Recent Labs    04/03/23 1025 06/03/23 0148 06/04/23 0642  WBC 6.4 7.1 7.9  HGB 11.5* 11.7* 10.7*  HCT 36.2 36.4 33.4*  PLT 452* 387 379    COAGS: Recent Labs    06/03/23 1353  INR 1.1    BMP: Recent Labs    04/03/23 1025 06/03/23 0148 06/04/23 0642  NA 137 138 138  K 3.4* 3.4* 3.6  CL 106 108 107  CO2 25 21* 22  GLUCOSE 95 95 99  BUN 16 11 8   CALCIUM 8.9 8.8* 8.4*  CREATININE 0.81 0.69 0.75  GFRNONAA >60 >60 >60    LIVER FUNCTION TESTS: Recent Labs    04/03/23 1025 06/03/23 0148  BILITOT 0.6 0.4  AST 19 20  ALT 11 10  ALKPHOS 63 60  PROT 7.4 7.0  ALBUMIN 3.9 3.8    Assessment and Plan:  37 y.o. female inpatient. History of polysubstance abuse, schizophrenia, bipolar. Presented to the ED at [name] on 12.30.24 with severe headache and AMS. Found to have a small bilateral frontal SAH. 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 No other aneurysm seen. S/p Primary coiling embolization performed  with complete aneurysm occlusion. No thromboembolic or hemorrhagic complication with Dr. MARLA. de Nile Erichsen.  Patient seen at bedside with Dr. MARLA. everitt Nile Erichsen. Due to encephalopathy Patient remains on the ventilator and sedated. Sisters at bedside. Right groin access site is soft with no active bleeding and no appreciable pseudoaneurysm. Dressing is C/D/I.  PLAN: Consult to psychology placed.  Outpatient orders for [facility] placed for 6 months time.   NIR will continue to follow along - plans per Critical Care/ Neurology/Neurosurgery  Electronically Signed: Delon JAYSON Beagle, NP 06/04/2023, 1:19 PM   I spent a total of 15 Minutes at the patient's bedside AND on the patient's hospital floor or unit, greater than 50% of which was counseling/coordinating care for [facility] s/p coiling

## 2023-06-04 NOTE — Progress Notes (Signed)
 Pt w/ parameters of SBP <160, then a note statring SBP 120-140 (prior to 24hr mark). Dr. De Hollingshead informed to clarify order. This RN spoke w/ the charge nurse, who agree to go w/ Clevi parameter of SBP <160. Waiting response form Dr. Isaiah Serge.

## 2023-06-04 NOTE — Progress Notes (Signed)
 NAME:  Tiffany [name], MRN:  [id number], DOB:  04/27/1986, LOS: 1 ADMISSION DATE:  06/03/2023, CONSULTATION DATE:  06/03/2023 REFERRING MD:  Dr. Griselda, CHIEF COMPLAINT:  SAH w/ aneurysm   History of Present Illness:  Patient is a 37 year old female with pertinent PMH polysubstance abuse (marijuana, cocaine), alcohol abuse, depression, and HTN but not on any medications presents to [facility] ED on 12/30 with SAH.  On 12/30 patient complaining of severe headache and called EMS.  On their arrival patient combative and verbally abusive.  Multiple open alcohol bottles on scene.  She stated multiple times that she wanted to go home and kill herself.  Patient brought to [facility] ED.  On arrival patient remained agitated and required Haldol  and soft restraints.  Initial BP 167/102.  Patient protecting airway on room air.  Afebrile.  CT head showing common aneurysm with small bilateral frontal SAH.  Neuro consulted and recommended CTA head.  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 NSG consulted and will plan on coil embolization early this morning. Started on Cleviprex . PCCM to admit.  Pertinent  Medical History   Past Medical History:  Diagnosis Date   Addiction, marijuana (HCC)    Alcohol abuse    Chlamydia    Cocaine addiction (HCC)    Depression    History of concussion    Hypertension    does not take any meds     Significant Hospital Events: Including procedures, antibiotic start and stop dates in addition to other pertinent events   12/30 > cerebral angiogram and endovascular aneurysm embolization with aneurysmal occlusion and IR.  Returns to ICU intubated and sedated   Interim History / Subjective:  Propofol  30 Phenylephrine  60 Fentanyl  50 0.40, PEEP 5, 470 x 16 I/O- 916 cc total Hypokalemia overnight  Objective   Blood pressure 134/81, pulse (!) 51, temperature 99.2 F (37.3  C), temperature source Oral, resp. rate 17, height 5' 5.98 (1.676 m), weight 56.2 kg, SpO2 100%, unknown if currently breastfeeding.    Vent Mode: PRVC FiO2 (%):  [40 %] 40 % Set Rate:  [16 bmp] 16 bmp Vt Set:  [470 mL] 470 mL PEEP:  [5 cmH20] 5 cmH20 Plateau Pressure:  [11 cmH20] 11 cmH20   Intake/Output Summary (Last 24 hours) at 06/04/2023 0717 Last data filed at 06/04/2023 0600 Gross per 24 hour  Intake 2783.65 ml  Output 3800 ml  Net -1016.35 ml   Filed Weights   06/03/23 1230  Weight: 56.2 kg    Examination: General: Intubated and sedated HENT: ET tube in good position, no secretions Lungs: Clear bilaterally Cardiovascular: Regular, no murmur, borderline tachycardic Abdomen: Nondistended, positive bowel sounds Extremities: No edema Neuro: Sedated, grimace with stimulation.  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, impaired airway protection -PRVC 8 cc/kg -Assess for lightening sedation and possible SBT 12/31 -VAP prevention orders in place -Pulmonary hygiene  HypoK Repleted -Follow BMP, urine output  Polysubstance abuse Alcoholism -Folate, thiamine  ordered -Watch for any evidence of withdrawal -Careful with beta-blockers given cocaine positive  Depression -Once she is stable  to participate she will need psychiatry evaluation given suicidal and homicidal statements made in the ED at presentation -Suicide sitter once she is no longer sedated  Best Practice (right click and Reselect all SmartList Selections daily)   Diet/type: NPO w/ oral meds DVT prophylaxis  SCD Pressure ulcer(s): N/A GI prophylaxis: H2B Lines: N/A Foley:  N/A Code Status:  full code Last date of multidisciplinary goals of care discussion [discussed status with the patient's sisters at bedside 12/31]  Labs   CBC: Recent Labs  Lab 06/03/23 0148 06/04/23 0642  WBC 7.1 7.9  NEUTROABS 4.4  --   HGB 11.7* 10.7*  HCT 36.4 33.4*  MCV 87.3 86.8  PLT 387 379    Basic Metabolic Panel: Recent Labs  Lab 06/03/23 0148  NA 138  K 3.4*  CL 108  CO2 21*  GLUCOSE 95  BUN 11  CREATININE 0.69  CALCIUM 8.8*  MG 1.8   GFR: Estimated Creatinine Clearance: 84.6 mL/min (by C-G formula based on SCr of 0.69 mg/dL). Recent Labs  Lab 06/03/23 0148 06/04/23 0642  WBC 7.1 7.9    Liver Function Tests: Recent Labs  Lab 06/03/23 0148  AST 20  ALT 10  ALKPHOS 60  BILITOT 0.4  PROT 7.0  ALBUMIN 3.8   No results for input(s): LIPASE, AMYLASE in the last 168 hours. No results for input(s): AMMONIA in the last 168 hours.  ABG    Component Value Date/Time   TCO2 27 06/08/2011 1842     Coagulation Profile: Recent Labs  Lab 06/03/23 1353  INR 1.1    Cardiac Enzymes: No results for input(s): CKTOTAL, CKMB, CKMBINDEX, TROPONINI in the last 168 hours.  HbA1C: No results found for: HGBA1C  CBG: Recent Labs  Lab 06/03/23 0748  GLUCAP 112*    Review of Systems:   NA: patient refuses   Past Medical History:  She,  has a past medical history of Addiction, marijuana (HCC), Alcohol abuse, Chlamydia, Cocaine addiction (HCC), Depression, History of concussion, and Hypertension.   Surgical History:   Past Surgical History:  Procedure Laterality Date   WISDOM TOOTH EXTRACTION  2007     Social History:   reports that she has been smoking cigarettes. She has a 9 pack-year smoking history. She has never used smokeless tobacco. She reports current alcohol use of about 13.0 standard drinks of alcohol per week. She reports current drug use.  Frequency: 1.00 time per week. Drugs: Marijuana and Cocaine.   Family History:  Her family history includes Cervical cancer in her paternal aunt; Diabetes in an other family member; Heart failure in her mother; Hypertension in her mother and another family member.   Allergies Allergies  Allergen Reactions   Shellfish Allergy Anaphylaxis   Pollen Extract    Aspirin Palpitations     Home Medications  Prior to Admission medications   Medication Sig Start Date End Date Taking? Authorizing Provider  ibuprofen  (ADVIL ) 200 MG tablet Take 400 mg by mouth every 6 (six) hours as needed for headache or mild pain.    [provider]  ibuprofen  (ADVIL ) 600 MG tablet Take 1 tablet (600 mg total) by mouth every 6 (six) hours as needed. 04/03/23   Dean Clarity, MD  methocarbamol  (ROBAXIN ) 500 MG tablet Take 1 tablet (500 mg total) by mouth 2 (two) times daily as needed for muscle spasms. 04/03/23   Dean Clarity, MD  traZODone  (DESYREL ) 50 MG tablet Take 1 tablet (50 mg total) by mouth at bedtime. 09/13/22  Dasie Ellouise CROME, FNP     Critical care time: 75 minutes    Lamar Chris, MD, PhD 06/04/2023, 7:17 AM [facility] [phone] or if no answer before 7:00PM call [phone] For any issues after 7:00PM please call eLink [phone] [facility]

## 2023-06-05 DIAGNOSIS — I609 Nontraumatic subarachnoid hemorrhage, unspecified: Secondary | ICD-10-CM | POA: Diagnosis not present

## 2023-06-05 LAB — BASIC METABOLIC PANEL
Anion gap: 9 (ref 5–15)
BUN: 6 mg/dL (ref 6–20)
CO2: 23 mmol/L (ref 22–32)
Calcium: 8.7 mg/dL — ABNORMAL LOW (ref 8.9–10.3)
Chloride: 103 mmol/L (ref 98–111)
Creatinine, Ser: 0.61 mg/dL (ref 0.44–1.00)
GFR, Estimated: >60 mL/min (ref >60)
Glucose, Bld: 94 mg/dL (ref 70–99)
Potassium: 3.5 mmol/L (ref 3.5–5.1)
Sodium: 135 mmol/L (ref 135–145)

## 2023-06-05 LAB — CBC
HCT: 33.9 % — ABNORMAL LOW (ref 36.0–46.0)
Hemoglobin: 11.1 g/dL — ABNORMAL LOW (ref 12.0–15.0)
MCH: 28.2 pg (ref 26.0–34.0)
MCHC: 32.7 g/dL (ref 30.0–36.0)
MCV: 86 fL (ref 80.0–100.0)
Platelets: 318 10*3/uL (ref 150–400)
RBC: 3.94 MIL/uL (ref 3.87–5.11)
RDW: 19.9 % — ABNORMAL HIGH (ref 11.5–15.5)
WBC: 7 10*3/uL (ref 4.0–10.5)
nRBC: 0 % (ref 0.0–0.2)

## 2023-06-05 LAB — GLUCOSE, CAPILLARY
Glucose-Capillary: 108 mg/dL — ABNORMAL HIGH (ref 70–99)
Glucose-Capillary: 108 mg/dL — ABNORMAL HIGH (ref 70–99)
Glucose-Capillary: 95 mg/dL (ref 70–99)
Glucose-Capillary: 104 mg/dL — ABNORMAL HIGH (ref 70–99)
Glucose-Capillary: 87 mg/dL (ref 70–99)

## 2023-06-05 MED ORDER — BUTALBITAL-APAP-CAFFEINE 50-325-40 MG PO TABS
1.0000 | ORAL_TABLET | Freq: Four times a day (QID) | ORAL | Status: DC | PRN
  Administered 2023-06-05 – 2023-06-08 (×11): 1 via ORAL
  Filled 2023-06-05 (×11): qty 1

## 2023-06-05 MED ORDER — ENOXAPARIN SODIUM 40 MG/0.4ML IJ SOSY
40.0000 mg | PREFILLED_SYRINGE | INTRAMUSCULAR | Status: DC
  Administered 2023-06-05 – 2023-06-13 (×8): 40 mg via SUBCUTANEOUS
  Filled 2023-06-05 (×9): qty 0.4

## 2023-06-05 MED ORDER — NIMODIPINE 30 MG PO CAPS
60.0000 mg | ORAL_CAPSULE | ORAL | Status: DC
  Administered 2023-06-05 – 2023-06-12 (×45): 60 mg via ORAL
  Filled 2023-06-05 (×44): qty 2

## 2023-06-05 MED ORDER — ADULT MULTIVITAMIN W/MINERALS CH
1.0000 | ORAL_TABLET | Freq: Every day | ORAL | Status: DC
  Administered 2023-06-05 – 2023-06-13 (×9): 1 via ORAL
  Filled 2023-06-05 (×8): qty 1

## 2023-06-05 NOTE — Progress Notes (Signed)
 Subjective: Patient reports patient doing well extubated condition a headache but otherwise nonfocal  Objective: Vital signs in last 24 hours: Temp:  [98.3 F (36.8 C)-100.6 F (38.1 C)] 100.6 F (38.1 C) (01/01 0743) Pulse Rate:  [51-69] 61 (01/01 0700) Resp:  [2-23] 17 (01/01 0700) BP: (103-167)/(63-86) 142/81 (01/01 0700) SpO2:  [92 %-100 %] 97 % (01/01 0700) Arterial Line BP: (107-197)/(51-92) 127/65 (12/31 2300) FiO2 (%):  [40 %] 40 % (12/31 1153) Weight:  [55.5 kg-57.5 kg] 55.5 kg (01/01 0500)  Intake/Output from previous day: 12/31 0701 - 01/01 0700 In: 554.8 [I.V.:398.8; IV Piggyback:156] Out: 825 [Urine:825] Intake/Output this shift: No intake/output data recorded.  Awake alert oriented strength 5 out of 5  Lab Results: Recent Labs    06/04/23 0642 06/05/23 0637  WBC 7.9 7.0  HGB 10.7* 11.1*  HCT 33.4* 33.9*  PLT 379 318   BMET Recent Labs    06/04/23 0642 06/05/23 0637  NA 138 135  K 3.6 3.5  CL 107 103  CO2 22 23  GLUCOSE 99 94  BUN 8 6  CREATININE 0.75 0.61  CALCIUM 8.4* 8.7*    Studies/Results: Portable Chest xray Result Date: 06/04/2023 CLINICAL DATA:  Respiratory failure. EXAM: PORTABLE CHEST 1 VIEW COMPARISON:  January 27, 2021. FINDINGS: The heart size and mediastinal contours are within normal limits. Endotracheal and nasogastric tubes are in grossly good position. Both lungs are clear. The visualized skeletal structures are unremarkable. IMPRESSION: No active disease. Electronically Signed   By: Lynwood Landy Raddle M.D.   On: 06/04/2023 10:08   DG Abd Portable 1V Result Date: 06/03/2023 CLINICAL DATA:  Orogastric tube placement. EXAM: PORTABLE ABDOMEN - 1 VIEW COMPARISON:  None Available. FINDINGS: Tip and side port of the enteric tube below the diaphragm in the stomach. There is mild gaseous distention of stomach and small bowel centrally. No evidence of free air on this portable supine view. IMPRESSION: Tip and side port of the enteric tube  below the diaphragm in the stomach. Electronically Signed   By: Andrea Gasman M.D.   On: 06/03/2023 18:29    Assessment/Plan: Subarachnoid hemorrhage post bleed day 2 post coil day 1 doing well neurologically extubated continue observation blood pressure and vital signs are stable  LOS: 2 days     Tiffany [name] 06/05/2023, 8:41 AM
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
## 2023-06-05 NOTE — Progress Notes (Addendum)
 Referring Physician(s): Reyes Budge  Chief Complaint: Aneurysmal subarachnoid hemorrhage.  History of present illness:  Tiffany [name] is a 38 year old female was brought by EMS to the ED on 06/03/2023 complaining of headache.  Her past medical history significant for polysubstance abuse (marijuana, cocaine), alcohol abuse, hypertension, depression, bipolar disorder and schizophrenia.  At admission, she was agitated and combative requiring sedation and IVC for patient safety. No focal neurological deficit.  Head CT showed basal cisterns and frontal interhemispheric subarachnoid hemorrhage (modified Fisher 1; Hunt and Hess 2).  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  She was started on Cleviprex  for blood pressure control.  She underwent uneventful primary coil embolization of a ruptured anterior communicating artery aneurysm on 06/03/2023.  She was extubated yesterday and is neurologically intact, complaining only of headache which has responded to Fioricet .  Past Medical History:  Diagnosis Date   Addiction, marijuana (HCC)    Alcohol abuse    Chlamydia    Cocaine addiction (HCC)    Depression    History of concussion    Hypertension    does not take any meds    Past Surgical History:  Procedure Laterality Date   RADIOLOGY WITH ANESTHESIA N/A 06/03/2023   Procedure: IR WITH ANESTHESIA;  Surgeon: de Macedo Rodrigues, Georgia Baria, MD;  Location: [facility] OR;  Service: Radiology;  Laterality: N/A;   WISDOM TOOTH EXTRACTION  2007    Allergies: Shellfish allergy, Pollen extract, and Aspirin  Medications: Prior to Admission medications   Medication Sig Start Date End Date Taking? Authorizing Provider  ibuprofen  (ADVIL ) 200 MG tablet Take 400 mg by mouth every 6 (six) hours as needed for headache or mild pain.    [provider]  ibuprofen  (ADVIL ) 600 MG tablet Take 1 tablet (600 mg total) by mouth every 6 (six) hours as needed.  04/03/23   Dean Clarity, MD  methocarbamol  (ROBAXIN ) 500 MG tablet Take 1 tablet (500 mg total) by mouth 2 (two) times daily as needed for muscle spasms. 04/03/23   Dean Clarity, MD  traZODone  (DESYREL ) 50 MG tablet Take 1 tablet (50 mg total) by mouth at bedtime. 09/13/22   Dasie Ellouise CROME, FNP     Family History  Problem Relation Age of Onset   Hypertension Other    Diabetes Other    Heart failure Mother    Hypertension Mother    Cervical cancer Paternal Aunt     Social History   Socioeconomic History   Marital status: Single    Spouse name: Not on file   Number of children: Not on file   Years of education: Not on file   Highest education level: Not on file  Occupational History   Not on file  Tobacco Use   Smoking status: Every Day    Current packs/day: 1.00    Average packs/day: 1 pack/day for 9.0 years (9.0 ttl pk-yrs)    Types: Cigarettes   Smokeless tobacco: Never  Substance and Sexual Activity   Alcohol use: Yes    Alcohol/week: 13.0 standard drinks of alcohol    Types: 12 Cans of beer, 1 Standard drinks or equivalent per week    Comment: daily   Drug use: Yes    Frequency: 1.0 times per week    Types: Marijuana, Cocaine    Comment: cocaine use currently   Sexual activity: Yes    Birth control/protection: Condom  Other Topics Concern   Not on file  Social  History Narrative   Not on file   Social Drivers of Health   Financial Resource Strain: Not on file  Food Insecurity: Patient Unable To Answer (06/04/2023)   Hunger Vital Sign    Worried About Running Out of Food in the Last Year: Patient unable to answer    Ran Out of Food in the Last Year: Patient unable to answer  Transportation Needs: Patient Unable To Answer (06/04/2023)   PRAPARE - Transportation    Lack of Transportation (Medical): Patient unable to answer    Lack of Transportation (Non-Medical): Patient unable to answer  Physical Activity: Not on file  Stress: Not on file  Social  Connections: Unknown (10/17/2021)   Received from [facility]   Social Network    Social Network: Not on file     Vital Signs: BP (!) 150/85   Pulse (!) 59   Temp 99.3 F (37.4 C) (Oral)   Resp 16   Ht 5' 5.98 (1.676 m)   Wt 55.5 kg   SpO2 97%   BMI 19.76 kg/m   Physical Exam HENT:     Head: Normocephalic.     Ears:     Comments: Post traumatic swelling of upper lip.    Mouth/Throat:     Mouth: Mucous membranes are moist.  Eyes:     Extraocular Movements: Extraocular movements intact.     Pupils: Pupils are equal, round, and reactive to light.  Neurological:     General: No focal deficit present.     Mental Status: She is alert and oriented to person, place, and time.     Cranial Nerves: Cranial nerves 2-12 are intact.     Sensory: Sensation is intact.     Imaging: Portable Chest xray Result Date: 06/04/2023 CLINICAL DATA:  Respiratory failure. EXAM: PORTABLE CHEST 1 VIEW COMPARISON:  January 27, 2021. FINDINGS: The heart size and mediastinal contours are within normal limits. Endotracheal and nasogastric tubes are in grossly good position. Both lungs are clear. The visualized skeletal structures are unremarkable. IMPRESSION: No active disease. Electronically Signed   By: Lynwood Landy Raddle M.D.   On: 06/04/2023 10:08   DG Abd Portable 1V Result Date: 06/03/2023 CLINICAL DATA:  Orogastric tube placement. EXAM: PORTABLE ABDOMEN - 1 VIEW COMPARISON:  None Available. FINDINGS: Tip and side port of the enteric tube below the diaphragm in the stomach. There is mild gaseous distention of stomach and small bowel centrally. No evidence of free air on this portable supine view. IMPRESSION: Tip and side port of the enteric tube below the diaphragm in the stomach. Electronically Signed   By: Andrea Gasman M.D.   On: 06/03/2023 18:29   CT ANGIO HEAD NECK W WO CM Addendum Date: 06/03/2023 ADDENDUM REPORT: 06/03/2023 05:06 ADDENDUM: Study discussed by telephone with  PA ROBERT BROWNING on 06/03/2023 at 0437 hours. Electronically Signed   By: VEAR Hurst M.D.   On: 06/03/2023 05:06   Result Date: 06/03/2023 CLINICAL DATA:  38 year old female with altered mental status. Subarachnoid hemorrhage suspicious for ruptured intracranial aneurysm. EXAM: CT ANGIOGRAPHY HEAD AND NECK WITH AND WITHOUT CONTRAST TECHNIQUE: Multidetector CT imaging of the head and neck was performed using the standard protocol during bolus administration of intravenous contrast. Multiplanar CT image reconstructions and MIPs were obtained to evaluate the vascular anatomy. Carotid stenosis measurements (when applicable) are obtained utilizing NASCET criteria, using the distal internal carotid diameter as the denominator. RADIATION DOSE REDUCTION: This exam was performed according to the departmental  dose-optimization program which includes automated exposure control, adjustment of the mA and/or kV according to patient size and/or use of iterative reconstruction technique. CONTRAST:  60mL OMNIPAQUE  IOHEXOL  350 MG/ML SOLN COMPARISON:  Head CT 0333 hours today. FINDINGS: CT HEAD Brain: Stable 16 mm area of what appears to be primarily extra-axial hemorrhage in the interhemispheric fissure anterior to the 3rd ventricle (series 5, image 21) and a small amount of additional anterior inter hemispheric subarachnoid blood. Underlying normal cerebral volume. No ventriculomegaly. No IVH is visible. Small volume of additional bilateral basilar cistern blood is suspected, such as elsewhere in the suprasellar and sylvian fissures, stable. No discrete cerebral edema or cortically based infarct identified. Pre medullary cistern and cisterna magna remain normal. Calvarium and skull base: Intact, negative. Paranasal sinuses: Visualized paranasal sinuses and mastoids are stable and well aerated. Orbits: Visualized orbits and scalp soft tissues are within normal limits. CTA NECK Skeleton: No acute osseous abnormality identified. Age  advanced cervical disc bulging and endplate spurring. Upper chest: Negative.  Visible upper lungs are clear. Other neck: Negative. Aortic arch: 3 vessel arch.  No arch atherosclerosis. Right carotid system: Negative. Left carotid system: Negative. Vertebral arteries: Mild calcified plaque at the right subclavian artery origin. No stenosis. Normal right vertebral artery origin. Right vertebral artery is patent and within normal limits to the skull base. Proximal left subclavian artery and left vertebral artery origin are normal. Left vertebral artery is codominant, patent within normal limits to the skull base. CTA HEAD Posterior circulation: Codominant distal vertebral arteries, PICA origins, vertebrobasilar junction appear normal. Patent basilar artery without stenosis. Basilar tip, SCA and PCA origins are within normal limits. Diminutive posterior communicating arteries are present. Bilateral PCA branches are within normal limits. Anterior circulation: Both ICA siphons are patent no discrete siphon plaque, but diminutive appearance of the supraclinoid ICAs probably reflects a degree of vaso spasm. MCA and ACA origins are patent but also diminutive. Anteriorly and superiorly directed saccular aneurysm arises from the anterior communicating artery at the junction with the left ACA (series 9, image 103, series 13, image 88). It is up to 4 mm in length. The tip is bulbous, about 3 mm and mildly irregular. This is directed slightly to the left of midline. Both ACA A2 segments remain patent. Other ACA branches are within normal limits. Left MCA M1 and bifurcation are patent. Right MCA M1 and bifurcation are patent. No MCA aneurysm. Bilateral MCA branches are within normal limits. Venous sinuses: Patent. Anatomic variants: None. Review of the MIP images confirms the above findings IMPRESSION: 1. Positive for Ruptured Anterior Communicating Artery Aneurysm. Elongated 4 mm saccular aneurysm arising at the junction of the  Acomm and Left ACA, directed anteriorly and cephalad. 2. Mild intracranial artery Vasospasm. No large vessel occlusion. No 2nd aneurysm identified. No significant atherosclerosis in the head or neck. 3. Stable small volume acute intracranial hemorrhage from #1. No IVH, ventriculomegaly, midline shift. Electronically Signed: By: VEAR Hurst M.D. On: 06/03/2023 04:37   CT HEAD WO CONTRAST ( ) Result Date: 06/03/2023 CLINICAL DATA:  Mental status change, unknown cause EXAM: CT HEAD WITHOUT CONTRAST TECHNIQUE: Contiguous axial images were obtained from the base of the skull through the vertex without intravenous contrast. RADIATION DOSE REDUCTION: This exam was performed according to the departmental dose-optimization program which includes automated exposure control, adjustment of the mA and/or kV according to patient size and/or use of iterative reconstruction technique. COMPARISON:  CT head 11/04/2011 FINDINGS: Brain: No evidence of large-territorial acute infarction. No  parenchymal hemorrhage. No mass lesion. Parafalcine curvilinear and likely fall or hyperdensity measuring up to 17 mm with associated adjacent bilateral small volume inferior frontal subarachnoid hemorrhage (5:44, 3:22). No hydrocephalus. Basilar cisterns are patent. Vascular: No hyperdense vessel. Skull: No acute fracture or focal lesion. Sinuses/Orbits: Paranasal sinuses and mastoid air cells are clear. The orbits are unremarkable. Other: None. IMPRESSION: Findings suggestive of a bleeding A-comm aneurysm with small bilateral frontal subarachnoid hemorrhage. These results were called by telephone at the time of interpretation on 06/03/2023 at 3:47 am to provider LAMAR SCHLOSSMAN , who verbally acknowledged these results. Electronically Signed   By: Morgane  Naveau M.D.   On: 06/03/2023 03:47    Labs:  CBC: Recent Labs    04/03/23 1025 06/03/23 0148 06/04/23 0642 06/05/23 0637  WBC 6.4 7.1 7.9 7.0  HGB 11.5* 11.7* 10.7* 11.1*  HCT  36.2 36.4 33.4* 33.9*  PLT 452* 387 379 318    COAGS: Recent Labs    06/03/23 1353  INR 1.1    BMP: Recent Labs    04/03/23 1025 06/03/23 0148 06/04/23 0642 06/05/23 0637  NA 137 138 138 135  K 3.4* 3.4* 3.6 3.5  CL 106 108 107 103  CO2 25 21* 22 23  GLUCOSE 95 95 99 94  BUN 16 11 8 6   CALCIUM 8.9 8.8* 8.4* 8.7*  CREATININE 0.81 0.69 0.75 0.61  GFRNONAA >60 >60 >60 >60    LIVER FUNCTION TESTS: Recent Labs    04/03/23 1025 06/03/23 0148  BILITOT 0.6 0.4  AST 19 20  ALT 11 10  ALKPHOS 63 60  PROT 7.4 7.0  ALBUMIN 3.9 3.8    Assessment:  Status post coiling of a ruptured anterior communicating artery aneurysm.  Patient is doing well, neurologically intact.  No access site complication.  Consultation obtained for management of her psychiatric comorbidities.  Orders placed for TCDs.   Signed: Almer Bushey De Macedo Rodrigues, MD 06/05/2023, 2:41 PM    I spent a total of 20 Minutes at the patient's bedside AND on the patient's hospital floor or unit, greater than 50% of which was counseling/coordinating care for [facility] s/p coiling.

## 2023-06-05 NOTE — Progress Notes (Signed)
 eLink Physician-Brief Progress Note Patient Name: Tiffany [name] DOB: 06/04/1986 MRN: [id number]   Date of Service  06/05/2023  HPI/Events of Note  38 year old female with aneurysmal subarachnoid hemorrhage status post coiling 12/30.  Extubated.  Reporting headache and refusing Tylenol .  Requesting ibuprofen .  eICU Interventions  Ibuprofen /NSAIDs could exacerbate her underlying subarachnoid hemorrhage and would be relatively contraindicated.  In place of Tylenol , can attempt Fioricet .     Intervention Category Intermediate Interventions: Pain - evaluation and management  Keilyn Haggard 06/05/2023, 4:53 AM

## 2023-06-05 NOTE — Evaluation (Signed)
 Physical Therapy Evaluation Patient Details Name: Tiffany [name] MRN: [id number] DOB: February 21, 1986 Today's Date: 06/05/2023  History of Present Illness  Pt is a 38 y/o female presenting to [facility] ED on 12/30 with SAH.  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  PMHx:  marijuana addiction, alcohol abuse, cocaine addiction, [place], concussion, depression  Clinical Impression  Pt admitted with/for SAH due to aneurysm s/p coiling.  Pt is not at baseline, needing min/mod assist for basic mobility with gait and basic mobility.  Pt currently limited functionally due to the problems listed. ( See problems list.)   Pt will benefit from PT to maximize function and safety in order to get ready for next venue listed below.         If plan is discharge home, recommend the following: A little help with walking and/or transfers;A little help with bathing/dressing/bathroom;Assistance with cooking/housework;Assistance with feeding;Assist for transportation   Can travel by private vehicle        Equipment Recommendations  (TBD)  Recommendations for Other Services  Rehab consult    Functional Status Assessment Patient has had a recent decline in their functional status and demonstrates the ability to make significant improvements in function in a reasonable and predictable amount of time.     Precautions / Restrictions Precautions Precautions: Fall      Mobility  Bed Mobility Overal bed mobility: Needs Assistance Bed Mobility: Supine to Sit     Supine to sit: Min assist     General bed mobility comments: minimal assist up via L elbow, cues for direction.    Transfers Overall transfer level: Needs assistance Equipment used: None, 1 person hand held assist Transfers: Sit to/from Stand, Bed to chair/wheelchair/BSC Sit to Stand: Min assist   Step pivot transfers: Min assist       General transfer comment: cues for hand  placement, direction,  steadying assist for significant list L associated with HA.    Ambulation/Gait Ambulation/Gait assistance: Mod assist Gait Distance (Feet): 15 Feet (and an additional 18 to the recliner) Assistive device: 1 person hand held assist Gait Pattern/deviations: Step-through pattern   Gait velocity interpretation: <1.8 ft/sec, indicate of risk for recurrent falls   General Gait Details: generally unsteady with drift and list to the left with scissoring and then need for increased stability when pt needs to turn and back up.  Stairs            Wheelchair Mobility     Tilt Bed    Modified Rankin (Stroke Patients Only) Modified Rankin (Stroke Patients Only) Modified Rankin: Moderately severe disability     Balance Overall balance assessment: Needs assistance Sitting-balance support: Single extremity supported, Bilateral upper extremity supported, Feet supported Sitting balance-Leahy Scale: Poor Sitting balance - Comments: able to maintain and outside midline orientation without UE's, but prefers to use UE's and lists R.   Standing balance support: Single extremity supported, During functional activity Standing balance-Leahy Scale: Poor Standing balance comment: reliant on external support                             Pertinent Vitals/Pain Pain Assessment Pain Assessment: 0-10 Pain Score: 6  Pain Location: head Pain Descriptors / Indicators: Aching Pain Intervention(s): Monitored during session    Home Living Family/patient expects to be discharged to:: Private residence Living Arrangements: Non-relatives/Friends (room mate) Available Help at Discharge: Available PRN/intermittently (no family available per  pt) Type of Home: Apartment Home Access: Stairs to enter Entrance Stairs-Rails: Doctor, General Practice of Steps: 3   Home Layout: One level Home Equipment: None      Prior Function Prior Level of Function :  Independent/Modified Independent                     Extremity/Trunk Assessment   Upper Extremity Assessment Upper Extremity Assessment: Generalized weakness;Overall [facility] for tasks assessed    Lower Extremity Assessment Lower Extremity Assessment: Generalized weakness;Overall WFL for tasks assessed       Communication   Communication Communication: No apparent difficulties Cueing Techniques: Verbal cues  Cognition Arousal: Alert Behavior During Therapy: Flat affect Overall Cognitive Status: No family/caregiver present to determine baseline cognitive functioning                                          General Comments General comments (skin integrity, edema, etc.): pt appears disconterted about going directly back home and will likely best be served by SNF or an inpt rehab stay.  Pt asked if she qualified for SNF.   Pt was assisted in completing a full body wash up from bench level in the bathroom.    Exercises     Assessment/Plan    PT Assessment    PT Problem List         PT Treatment Interventions      PT Goals (Current goals can be found in the Care Plan section)  Acute Rehab PT Goals Patient Stated Goal: I need to be ready for going home before I get there. PT Goal Formulation: With patient Time For Goal Achievement: 06/19/23 Potential to Achieve Goals: Good    Frequency       Co-evaluation               AM-PAC PT 6 Clicks Mobility  Outcome Measure Help needed turning from your back to your side while in a flat bed without using bedrails?: A Little Help needed moving from lying on your back to sitting on the side of a flat bed without using bedrails?: A Little Help needed moving to and from a bed to a chair (including a wheelchair)?: None Help needed standing up from a chair using your arms (e.g., wheelchair or bedside chair)?: None Help needed to walk in hospital room?: None Help needed climbing 3-5 steps with a railing?  : A Lot 6 Click Score: 20    End of Session   Activity Tolerance: Patient tolerated treatment well;Patient limited by pain Patient left: in chair;with call bell/phone within reach;with nursing/sitter in room Nurse Communication: Mobility status      Time: 8487-8463 PT Time Calculation (min) (ACUTE ONLY): 24 min   Charges:   PT Evaluation $PT Eval Moderate Complexity: 1 Mod PT Treatments $Gait Training: 8-22 mins PT General Charges $$ ACUTE PT VISIT: 1 Visit         06/05/2023  [name], PT Acute Rehabilitation Services [phone]  (office)  Tiffany [name] 06/05/2023, 4:16 PM

## 2023-06-05 NOTE — Progress Notes (Signed)
 NAME:  Tiffany [name], MRN:  [id number], DOB:  [birth year], LOS: 2 ADMISSION DATE:  06/03/2023, CONSULTATION DATE:  06/03/2023 REFERRING MD:  Dr. Griselda, CHIEF COMPLAINT:  SAH w/ aneurysm   History of Present Illness:  Patient is a 38 year old female with pertinent PMH polysubstance abuse (marijuana, cocaine), alcohol abuse, depression, and HTN but not on any medications presents to [facility] ED on 12/30 with SAH.  On 12/30 patient complaining of severe headache and called EMS.  On their arrival patient combative and verbally abusive.  Multiple open alcohol bottles on scene.  She stated multiple times that she wanted to go home and kill herself.  Patient brought to [facility] ED.  On arrival patient remained agitated and required Haldol  and soft restraints.  Initial BP 167/102.  Patient protecting airway on room air.  Afebrile.  CT head showing common aneurysm with small bilateral frontal SAH.  Neuro consulted and recommended CTA head.  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 NSG consulted and will plan on coil embolization early this morning. Started on Cleviprex . PCCM to admit.  Pertinent  Medical History   Past Medical History:  Diagnosis Date   Addiction, marijuana (HCC)    Alcohol abuse    Chlamydia    Cocaine addiction (HCC)    Depression    History of concussion    Hypertension    does not take any meds     Significant Hospital Events: Including procedures, antibiotic start and stop dates in addition to other pertinent events   12/30 > cerebral angiogram and endovascular aneurysm embolization with aneurysmal occlusion and IR.  Returns to ICU intubated and sedated 12/31: Extubated  Interim History / Subjective:  Extubated 12/31 SBP goal changed to 120-140 Off phenylephrine , off Cleviprex  I/O- 1.1 L total Morning labs pending This morning she tells me that she recalls wanting  to give up, no longer live when she was in the emergency department but that she does not feel that way right now.  She was quite concerned about perceived lack of compassion at that time She has headache as well as some left eye blurriness  Objective   Blood pressure (!) 142/81, pulse 61, temperature 100.2 F (37.9 C), temperature source Oral, resp. rate 17, height 5' 5.98 (1.676 m), weight 55.5 kg, SpO2 97%, unknown if currently breastfeeding.    Vent Mode: PSV;CPAP FiO2 (%):  [40 %] 40 % Set Rate:  [16 bmp] 16 bmp Vt Set:  [470 mL-570 mL] 570 mL PEEP:  [5 cmH20] 5 cmH20 Pressure Support:  [5 cmH20-12 cmH20] 5 cmH20 Plateau Pressure:  [13 cmH20] 13 cmH20   Intake/Output Summary (Last 24 hours) at 06/05/2023 0716 Last data filed at 06/05/2023 0600 Gross per 24 hour  Intake 554.8 ml  Output 825 ml  Net -270.2 ml   Filed Weights   06/03/23 1230 06/04/23 1800 06/05/23 0500  Weight: 56.2 kg 57.5 kg 55.5 kg    Examination: General: Awake, alert, interacting appropriately HENT: Oropharynx clear, strong voice, no secretions Lungs: Clear bilaterally Cardiovascular: Regular, no murmur Abdomen: Nondistended, positive bowel sounds Extremities: No edema Neuro: Awake and alert, answers questions appropriately, nonfocal  Resolved Hospital Problem list   Ventilator dependence  Assessment & Plan:  SAH, status post coiling in IR on 12/30 -Blood pressure parameters SBP goal 120-140, can likely liberalize to 160 on 1/1, will confirm with neurology -Codeprex available if needed -Nimodipine  scheduled -  Follow-up imaging as per neurology plans -Push PT/OT  HypoK -Following BMP, replete as indicated  Polysubstance abuse Alcoholism -Following for any evidence of withdrawal -Thiamine  and folate ordered -Careful with beta-blockers given cocaine positive  Depression -Appreciate psychiatry recommendations -Thiamine /folate, CIWA if evidence for agitation -Haldol  as needed -IVC given  suicidal ideation at presentation.  She denies the desire to hurt herself at this time.   Best Practice (right click and Reselect all SmartList Selections daily)   Diet/type: Regular consistency (see orders) DVT prophylaxis SCD Pressure ulcer(s): N/A GI prophylaxis: N/A Lines: N/A Foley:  N/A Code Status:  full code Last date of multidisciplinary goals of care discussion [discussed status with the patient's sisters at bedside 12/31]  Labs   CBC: Recent Labs  Lab 06/03/23 0148 06/04/23 0642  WBC 7.1 7.9  NEUTROABS 4.4  --   HGB 11.7* 10.7*  HCT 36.4 33.4*  MCV 87.3 86.8  PLT 387 379    Basic Metabolic Panel: Recent Labs  Lab 06/03/23 0148 06/04/23 0642  NA 138 138  K 3.4* 3.6  CL 108 107  CO2 21* 22  GLUCOSE 95 99  BUN 11 8  CREATININE 0.69 0.75  CALCIUM 8.8* 8.4*  MG 1.8 2.4  PHOS  --  4.1   GFR: Estimated Creatinine Clearance: 83.5 mL/min (by C-G formula based on SCr of 0.75 mg/dL). Recent Labs  Lab 06/03/23 0148 06/04/23 0642  WBC 7.1 7.9    Liver Function Tests: Recent Labs  Lab 06/03/23 0148  AST 20  ALT 10  ALKPHOS 60  BILITOT 0.4  PROT 7.0  ALBUMIN 3.8   No results for input(s): LIPASE, AMYLASE in the last 168 hours. No results for input(s): AMMONIA in the last 168 hours.  ABG    Component Value Date/Time   TCO2 27 06/08/2011 1842     Coagulation Profile: Recent Labs  Lab 06/03/23 1353  INR 1.1    Cardiac Enzymes: No results for input(s): CKTOTAL, CKMB, CKMBINDEX, TROPONINI in the last 168 hours.  HbA1C: No results found for: HGBA1C  CBG: Recent Labs  Lab 06/04/23 1242 06/04/23 1625 06/04/23 1925 06/04/23 2321 06/05/23 0419  GLUCAP 102* 92 114* 123* 108*    Review of Systems:   NA: patient refuses   Past Medical History:  She,  has a past medical history of Addiction, marijuana (HCC), Alcohol abuse, Chlamydia, Cocaine addiction (HCC), Depression, History of concussion, and Hypertension.    Surgical History:   Past Surgical History:  Procedure Laterality Date   RADIOLOGY WITH ANESTHESIA N/A 06/03/2023   Procedure: IR WITH ANESTHESIA;  Surgeon: de Macedo Rodrigues, Katyucia, MD;  Location: [facility] OR;  Service: Radiology;  Laterality: N/A;   WISDOM TOOTH EXTRACTION  2007     Social History:   reports that she has been smoking cigarettes. She has a 9 pack-year smoking history. She has never used smokeless tobacco. She reports current alcohol use of about 13.0 standard drinks of alcohol per week. She reports current drug use. Frequency: 1.00 time per week. Drugs: Marijuana and Cocaine.   Family History:  Her family history includes Cervical cancer in her paternal aunt; Diabetes in an other family member; Heart failure in her mother; Hypertension in her mother and another family member.   Allergies Allergies  Allergen Reactions   Shellfish Allergy Anaphylaxis   Pollen Extract    Aspirin Palpitations     Home Medications  Prior to Admission medications   Medication Sig Start Date End Date Taking? Authorizing Provider  ibuprofen  (ADVIL ) 200 MG tablet Take 400 mg by mouth every 6 (six) hours as needed for headache or mild pain.    [provider]  ibuprofen  (ADVIL ) 600 MG tablet Take 1 tablet (600 mg total) by mouth every 6 (six) hours as needed. 04/03/23   Dean Clarity, MD  methocarbamol  (ROBAXIN ) 500 MG tablet Take 1 tablet (500 mg total) by mouth 2 (two) times daily as needed for muscle spasms. 04/03/23   Dean Clarity, MD  traZODone  (DESYREL ) 50 MG tablet Take 1 tablet (50 mg total) by mouth at bedtime. 09/13/22   Dasie Ellouise CROME, FNP     Critical care time:  NA    Lamar Chris, MD, PhD 06/05/2023, 7:16 AM [facility] [phone] or if no answer before 7:00PM call [phone] For any issues after 7:00PM please call eLink [phone] [facility]

## 2023-06-06 ENCOUNTER — Inpatient Hospital Stay (HOSPITAL_COMMUNITY): Payer: BLUE CROSS/BLUE SHIELD

## 2023-06-06 DIAGNOSIS — I609 Nontraumatic subarachnoid hemorrhage, unspecified: Secondary | ICD-10-CM

## 2023-06-06 LAB — BASIC METABOLIC PANEL
Anion gap: 9 (ref 5–15)
BUN: 5 mg/dL — ABNORMAL LOW (ref 6–20)
CO2: 23 mmol/L (ref 22–32)
Calcium: 9 mg/dL (ref 8.9–10.3)
Chloride: 102 mmol/L (ref 98–111)
Creatinine, Ser: 0.67 mg/dL (ref 0.44–1.00)
GFR, Estimated: >60 mL/min (ref >60)
Glucose, Bld: 118 mg/dL — ABNORMAL HIGH (ref 70–99)
Potassium: 3.5 mmol/L (ref 3.5–5.1)
Sodium: 134 mmol/L — ABNORMAL LOW (ref 135–145)

## 2023-06-06 LAB — CBC
HCT: 38.9 % (ref 36.0–46.0)
Hemoglobin: 12.9 g/dL (ref 12.0–15.0)
MCH: 27.9 pg (ref 26.0–34.0)
MCHC: 33.2 g/dL (ref 30.0–36.0)
MCV: 84.2 fL (ref 80.0–100.0)
Platelets: 333 10*3/uL (ref 150–400)
RBC: 4.62 MIL/uL (ref 3.87–5.11)
RDW: 19.1 % — ABNORMAL HIGH (ref 11.5–15.5)
WBC: 6.1 10*3/uL (ref 4.0–10.5)
nRBC: 0 % (ref 0.0–0.2)

## 2023-06-06 LAB — GLUCOSE, CAPILLARY
Glucose-Capillary: 114 mg/dL — ABNORMAL HIGH (ref 70–99)
Glucose-Capillary: 122 mg/dL — ABNORMAL HIGH (ref 70–99)
Glucose-Capillary: 123 mg/dL — ABNORMAL HIGH (ref 70–99)
Glucose-Capillary: 129 mg/dL — ABNORMAL HIGH (ref 70–99)
Glucose-Capillary: 144 mg/dL — ABNORMAL HIGH (ref 70–99)
Glucose-Capillary: 92 mg/dL (ref 70–99)

## 2023-06-06 MED ORDER — THIAMINE MONONITRATE 100 MG PO TABS
200.0000 mg | ORAL_TABLET | Freq: Every day | ORAL | Status: AC
  Administered 2023-06-06: 200 mg via ORAL
  Filled 2023-06-06: qty 2

## 2023-06-06 MED ORDER — THIAMINE MONONITRATE 100 MG PO TABS
100.0000 mg | ORAL_TABLET | Freq: Every day | ORAL | Status: DC
  Administered 2023-06-07 – 2023-06-13 (×7): 100 mg via ORAL
  Filled 2023-06-06 (×7): qty 1

## 2023-06-06 MED ORDER — TRAZODONE HCL 50 MG PO TABS
50.0000 mg | ORAL_TABLET | Freq: Every day | ORAL | Status: DC
  Administered 2023-06-06 – 2023-06-07 (×2): 50 mg via ORAL
  Filled 2023-06-06 (×2): qty 1
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
## 2023-06-06 NOTE — Progress Notes (Signed)
 NAME:  Tiffany [name], MRN:  [id number], DOB:  [birth year], LOS: 3 ADMISSION DATE:  06/03/2023, CONSULTATION DATE:  06/03/2023 REFERRING MD:  Dr. Griselda, CHIEF COMPLAINT:  SAH w/ aneurysm   History of Present Illness:  Patient is a 38 year old female with pertinent PMH polysubstance abuse (marijuana, cocaine), alcohol abuse, depression, and HTN but not on any medications presents to [facility] ED on 12/30 with SAH.  On 12/30 patient complaining of severe headache and called EMS.  On their arrival patient combative and verbally abusive.  Multiple open alcohol bottles on scene.  She stated multiple times that she wanted to go home and kill herself.  Patient brought to [facility] ED.  On arrival patient remained agitated and required Haldol  and soft restraints.  Initial BP 167/102.  Patient protecting airway on room air.  Afebrile.  CT head showing common aneurysm with small bilateral frontal SAH.  Neuro consulted and recommended CTA head.  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 NSG consulted and will plan on coil embolization early this morning. Started on Cleviprex . PCCM to admit.  Pertinent  Medical History   Past Medical History:  Diagnosis Date   Addiction, marijuana (HCC)    Alcohol abuse    Chlamydia    Cocaine addiction (HCC)    Depression    History of concussion    Hypertension    does not take any meds     Significant Hospital Events: Including procedures, antibiotic start and stop dates in addition to other pertinent events   12/30 > cerebral angiogram and endovascular aneurysm embolization with aneurysmal occlusion and IR.  Returns to ICU intubated and sedated 12/31: Extubated  Interim History / Subjective:   Still with a headache  Objective   Blood pressure (!) 143/91, pulse 62, temperature 99 F (37.2 C), temperature source Oral, resp. rate 17, height 5' 5.98 (1.676 m),  weight 55.5 kg, SpO2 94%, unknown if currently breastfeeding.        Intake/Output Summary (Last 24 hours) at 06/06/2023 0756 Last data filed at 06/05/2023 2000 Gross per 24 hour  Intake 306 ml  Output 726 ml  Net -420 ml   Filed Weights   06/03/23 1230 06/04/23 1800 06/05/23 0500  Weight: 56.2 kg 57.5 kg 55.5 kg    Examination: General: Awake, alert, interacting appropriately HENT: Oropharynx clear, strong voice, no secretions Lungs: Clear bilaterally Cardiovascular: Regular without a murmur Abdomen: Positive bowel sounds nondistended Extremities: No edema Neuro: Alert, awake, interacting appropriately, follows commands.  Oriented  Resolved Hospital Problem list   Ventilator dependence  Assessment & Plan:  SAH, status post coiling in IR on 12/30 -SBP goal <160, Cleviprex  if needed -Continue scheduled nimodipine  -Plan for repeat head CT today 1/2 to rule out hydrocephalus given persistent headaches -Push PT/OT  HypoK -BMP pending this morning, continue to follow intermittently -Replete potassium as necessary  Polysubstance abuse Alcoholism -Following for any evidence of withdrawal -Thiamine  and folate as ordered  Depression -Appreciate psychiatry recommendations -Thiamine /folate, CIWA if evidence for agitation -Haldol  if needed -IVC and suicide 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 (right click and Reselect all SmartList Selections daily)   Diet/type: Regular consistency (see orders) DVT prophylaxis SCD Pressure ulcer(s): N/A GI prophylaxis: N/A Lines: N/A Foley:  N/A Code Status:  full code Last date of multidisciplinary goals of care discussion [discussed status with the patient's sisters at bedside 12/31]  Labs   CBC: Recent Labs  Lab 06/03/23 0148 06/04/23 9357 06/05/23 0637 06/06/23 0627  WBC 7.1 7.9 7.0 6.1  NEUTROABS 4.4  --   --   --   HGB 11.7* 10.7* 11.1* 12.9  HCT 36.4 33.4* 33.9* 38.9  MCV 87.3 86.8 86.0 84.2  PLT  387 379 318 333    Basic Metabolic Panel: Recent Labs  Lab 06/03/23 0148 06/04/23 0642 06/05/23 0637  NA 138 138 135  K 3.4* 3.6 3.5  CL 108 107 103  CO2 21* 22 23  GLUCOSE 95 99 94  BUN 11 8 6   CREATININE 0.69 0.75 0.61  CALCIUM 8.8* 8.4* 8.7*  MG 1.8 2.4  --   PHOS  --  4.1  --    GFR: Estimated Creatinine Clearance: 83.5 mL/min (by C-G formula based on SCr of 0.61 mg/dL). Recent Labs  Lab 06/03/23 0148 06/04/23 0642 06/05/23 0637 06/06/23 0627  WBC 7.1 7.9 7.0 6.1    Liver Function Tests: Recent Labs  Lab 06/03/23 0148  AST 20  ALT 10  ALKPHOS 60  BILITOT 0.4  PROT 7.0  ALBUMIN 3.8   No results for input(s): LIPASE, AMYLASE in the last 168 hours. No results for input(s): AMMONIA in the last 168 hours.  ABG    Component Value Date/Time   TCO2 27 06/08/2011 1842     Coagulation Profile: Recent Labs  Lab 06/03/23 1353  INR 1.1    Cardiac Enzymes: No results for input(s): CKTOTAL, CKMB, CKMBINDEX, TROPONINI in the last 168 hours.  HbA1C: No results found for: HGBA1C  CBG: Recent Labs  Lab 06/05/23 1716 06/05/23 1934 06/05/23 2358 06/06/23 0354 06/06/23 0722  GLUCAP 104* 87 144* 114* 92     Critical care time:  NA    Lamar Chris, MD, PhD 06/06/2023, 7:56 AM [facility] [phone] or if no answer before 7:00PM call [phone] For any issues after 7:00PM please call eLink [phone] [facility]

## 2023-06-06 NOTE — Progress Notes (Signed)
 PT Cancellation Note  Patient Details Name: Tiffany [name] MRN: [id number] DOB: 1985/11/20   Cancelled Treatment:    Reason Eval/Treat Not Completed: Patient declined, no reason specified.  Tried multiple methods of encouragement, but pt stated she was too hot and head hurt too much to get OOB today.  Will try back tomorrow. 06/06/2023  [name], PT Acute Rehabilitation Services [phone]  (office)   [name] [name] 06/06/2023, 4:38 PM

## 2023-06-06 NOTE — Progress Notes (Signed)
 Transcranial Doppler  Date POD PCO2 HCT BP  MCA ACA PCA OPHT SIPH VERT Basilar  1/2,rs     Right  Left   87  107   -101  -59   39  22   20  31    *  *   -58  -45   -73           Right  Left                                            Right  Left                                             Right  Left                                             Right  Left                                            Right  Left                                            Right  Left                                        MCA = Middle Cerebral Artery      OPHT = Opthalmic Artery     BASILAR = Basilar Artery   ACA = Anterior Cerebral Artery     SIPH = Carotid Siphon PCA = Posterior Cerebral Artery   VERT = Verterbral Artery                   Normal MCA = 62+\-12 ACA = 50+\-12 PCA = 42+\-23  (*) unable to insonate Right Lindegaard Ratio  2.2, Left Lindegaard Ratio 2.6  06/06/2023  3:15 PM Tiffany [name], Ricka BIRCH

## 2023-06-06 NOTE — TOC CAGE-AID Note (Signed)
 Transition of Care [facility]) - CAGE-AID Screening   Patient Details  Name: Tiffany [name] MRN: [id number] Date of Birth: December 08, 1985  Transition of Care [facility]) CM/SW Contact:    Inocente GORMAN Kindle, LCSW Phone Number: 06/06/2023, 11:13 AM   Clinical Narrative: Patient is disoriented and currently under IVC so not appropriate for assessment at this time. Drug screen was cocaine+.   CAGE-AID Screening: Substance Abuse Screening unable to be completed due to: : Patient unable to participate

## 2023-06-06 NOTE — Progress Notes (Signed)
 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 Safety sitter at the bedside.   Allergies: Shellfish allergy, Pollen extract, and Aspirin  Medications: Prior to Admission medications   Medication Sig Start Date End Date Taking? Authorizing Provider  ibuprofen  (ADVIL ) 200 MG tablet Take 400 mg by mouth every 6 (six) hours as needed for headache or mild pain.    [provider]  ibuprofen  (ADVIL ) 600 MG tablet Take 1 tablet (600 mg total) by mouth every 6 (six) hours as needed. 04/03/23   Dean Clarity, MD  methocarbamol  (ROBAXIN ) 500 MG tablet Take 1 tablet (500 mg total) by mouth 2 (two) times daily as needed for muscle spasms. 04/03/23   Dean Clarity, MD  traZODone  (DESYREL ) 50 MG tablet Take 1 tablet (50 mg total) by mouth at bedtime. 09/13/22   Dasie Ellouise CROME, FNP     Vital Signs: BP 124/82 (BP Location: Left Arm)   Pulse 69   Temp 99.7 F (37.6 C) (Oral)   Resp 20   Ht 5' 5.98 (1.676 m)   Wt 129 lb 13.6 oz (58.9 kg)   SpO2 96%   BMI 20.97 kg/m   Physical Exam Constitutional:      General: She is not in acute distress.    Appearance: She is not ill-appearing.  Cardiovascular:     Comments: Right femoral vascular site clean, soft and dry per documentation.  Pulmonary:     Effort: Pulmonary effort is normal.  Musculoskeletal:        General: Normal range of motion.  Neurological:     Mental Status: She is alert and oriented to person, place, and time.     Cranial Nerves: No dysarthria or facial asymmetry.     Motor: No weakness.     Comments: Patient is conversational and answers questions appropriately. Intermittent periods of inattention.      Imaging: Portable Chest xray Result Date:  06/04/2023 CLINICAL DATA:  Respiratory failure. EXAM: PORTABLE CHEST 1 VIEW COMPARISON:  January 27, 2021. FINDINGS: The heart size and mediastinal contours are within normal limits. Endotracheal and nasogastric tubes are in grossly good position. Both lungs are clear. The visualized skeletal structures are unremarkable. IMPRESSION: No active disease. Electronically Signed   By: Lynwood Landy Raddle M.D.   On: 06/04/2023 10:08   DG Abd Portable 1V Result Date: 06/03/2023 CLINICAL DATA:  Orogastric tube placement. EXAM: PORTABLE ABDOMEN - 1 VIEW COMPARISON:  None Available. FINDINGS: Tip and side port of the enteric tube below the diaphragm in the stomach. There is mild gaseous distention of stomach and small bowel centrally. No evidence of free air on this portable supine view. IMPRESSION: Tip and side port of the enteric tube below the diaphragm in the stomach. Electronically Signed   By: Andrea Gasman M.D.   On: 06/03/2023 18:29   CT ANGIO HEAD NECK W WO CM Addendum Date: 06/03/2023 ADDENDUM REPORT: 06/03/2023 05:06 ADDENDUM: Study discussed by telephone with PA ROBERT BROWNING on 06/03/2023 at 0437 hours. Electronically Signed   By: VEAR Hurst M.D.   On: 06/03/2023 05:06   Result Date: 06/03/2023 CLINICAL DATA:  38 year old female with altered mental status. Subarachnoid hemorrhage suspicious for ruptured intracranial aneurysm. EXAM: CT ANGIOGRAPHY HEAD AND NECK WITH AND WITHOUT CONTRAST TECHNIQUE: Multidetector CT  imaging of the head and neck was performed using the standard protocol during bolus administration of intravenous contrast. Multiplanar CT image reconstructions and MIPs were obtained to evaluate the vascular anatomy. Carotid stenosis measurements (when applicable) are obtained utilizing NASCET criteria, using the distal internal carotid diameter as the denominator. RADIATION DOSE REDUCTION: This exam was performed according to the departmental dose-optimization program which includes automated  exposure control, adjustment of the mA and/or kV according to patient size and/or use of iterative reconstruction technique. CONTRAST:  60mL OMNIPAQUE  IOHEXOL  350 MG/ML SOLN COMPARISON:  Head CT 0333 hours today. FINDINGS: CT HEAD Brain: Stable 16 mm area of what appears to be primarily extra-axial hemorrhage in the interhemispheric fissure anterior to the 3rd ventricle (series 5, image 21) and a small amount of additional anterior inter hemispheric subarachnoid blood. Underlying normal cerebral volume. No ventriculomegaly. No IVH is visible. Small volume of additional bilateral basilar cistern blood is suspected, such as elsewhere in the suprasellar and sylvian fissures, stable. No discrete cerebral edema or cortically based infarct identified. Pre medullary cistern and cisterna magna remain normal. Calvarium and skull base: Intact, negative. Paranasal sinuses: Visualized paranasal sinuses and mastoids are stable and well aerated. Orbits: Visualized orbits and scalp soft tissues are within normal limits. CTA NECK Skeleton: No acute osseous abnormality identified. Age advanced cervical disc bulging and endplate spurring. Upper chest: Negative.  Visible upper lungs are clear. Other neck: Negative. Aortic arch: 3 vessel arch.  No arch atherosclerosis. Right carotid system: Negative. Left carotid system: Negative. Vertebral arteries: Mild calcified plaque at the right subclavian artery origin. No stenosis. Normal right vertebral artery origin. Right vertebral artery is patent and within normal limits to the skull base. Proximal left subclavian artery and left vertebral artery origin are normal. Left vertebral artery is codominant, patent within normal limits to the skull base. CTA HEAD Posterior circulation: Codominant distal vertebral arteries, PICA origins, vertebrobasilar junction appear normal. Patent basilar artery without stenosis. Basilar tip, SCA and PCA origins are within normal limits. Diminutive posterior  communicating arteries are present. Bilateral PCA branches are within normal limits. Anterior circulation: Both ICA siphons are patent no discrete siphon plaque, but diminutive appearance of the supraclinoid ICAs probably reflects a degree of vaso spasm. MCA and ACA origins are patent but also diminutive. Anteriorly and superiorly directed saccular aneurysm arises from the anterior communicating artery at the junction with the left ACA (series 9, image 103, series 13, image 88). It is up to 4 mm in length. The tip is bulbous, about 3 mm and mildly irregular. This is directed slightly to the left of midline. Both ACA A2 segments remain patent. Other ACA branches are within normal limits. Left MCA M1 and bifurcation are patent. Right MCA M1 and bifurcation are patent. No MCA aneurysm. Bilateral MCA branches are within normal limits. Venous sinuses: Patent. Anatomic variants: None. Review of the MIP images confirms the above findings IMPRESSION: 1. 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 2. Mild intracranial artery Vasospasm. No large vessel occlusion. No 2nd aneurysm identified. No significant atherosclerosis in the head or neck. 3. Stable small volume acute intracranial hemorrhage from #1. No IVH, ventriculomegaly, midline shift. Electronically Signed: By: VEAR Hurst M.D. On: 06/03/2023 04:37   CT HEAD WO CONTRAST ( ) Result Date: 06/03/2023 CLINICAL DATA:  Mental status change, unknown cause EXAM: CT HEAD WITHOUT CONTRAST TECHNIQUE: Contiguous axial images were  obtained from the base of the skull through the vertex without intravenous contrast. RADIATION DOSE REDUCTION: This exam was performed according to the departmental dose-optimization program which includes automated exposure control, adjustment of the mA and/or kV according to patient size and/or use of iterative  reconstruction technique. COMPARISON:  CT head 11/04/2011 FINDINGS: Brain: No evidence of large-territorial acute infarction. No parenchymal hemorrhage. No mass lesion. Parafalcine curvilinear and likely fall or hyperdensity measuring up to 17 mm with associated adjacent bilateral small volume inferior frontal subarachnoid hemorrhage (5:44, 3:22). No hydrocephalus. Basilar cisterns are patent. Vascular: No hyperdense vessel. Skull: No acute fracture or focal lesion. Sinuses/Orbits: Paranasal sinuses and mastoid air cells are clear. The orbits are unremarkable. Other: None. IMPRESSION: Findings suggestive of a bleeding A-comm aneurysm with small bilateral frontal subarachnoid hemorrhage. These results were called by telephone at the time of interpretation on 06/03/2023 at 3:47 am to provider LAMAR SCHLOSSMAN , who verbally acknowledged these results. Electronically Signed   By: Morgane  Naveau M.D.   On: 06/03/2023 03:47    Labs:  CBC: Recent Labs    06/03/23 0148 06/04/23 0642 06/05/23 0637 06/06/23 0627  WBC 7.1 7.9 7.0 6.1  HGB 11.7* 10.7* 11.1* 12.9  HCT 36.4 33.4* 33.9* 38.9  PLT 387 379 318 333    COAGS: Recent Labs    06/03/23 1353  INR 1.1    BMP: Recent Labs    06/03/23 0148 06/04/23 0642 06/05/23 0637 06/06/23 0627  NA 138 138 135 134*  K 3.4* 3.6 3.5 3.5  CL 108 107 103 102  CO2 21* 22 23 23   GLUCOSE 95 99 94 118*  BUN 11 8 6  5*  CALCIUM 8.8* 8.4* 8.7* 9.0  CREATININE 0.69 0.75 0.61 0.67  GFRNONAA >60 >60 >60 >60    LIVER FUNCTION TESTS: Recent Labs    04/03/23 1025 06/03/23 0148  BILITOT 0.6 0.4  AST 19 20  ALT 11 10  ALKPHOS 63 60  PROT 7.4 7.0  ALBUMIN 3.9 3.8    Assessment and Plan:  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 Neurosurgery ordered a CT scan to rule out hydrocephalus. CT was negative for acute findings. The  headaches are likely secondary to the hemorrhage and should improve with time.    Labs and vitals are within expected ranges. Patient has been ambulating to the bathroom and eating a regular diet.   TCD scheduled for today. NIR will continue to follow.   Electronically Signed: Warren Dais, AGACNP-BC 06/06/2023, 11:46 AM   I spent a total of 15 Minutes at the the patient's bedside AND on the patient's hospital floor or unit, greater than 50% of which was counseling/coordinating care for [facility] s/p coiling.

## 2023-06-06 NOTE — Progress Notes (Signed)
 Subjective: The patient is alert and pleasant.  She complains of a headache.  Objective: Vital signs in last 24 hours: Temp:  [98.5 F (36.9 C)-100.7 F (38.2 C)] 99 F (37.2 C) (01/02 0700) Pulse Rate:  [54-81] 62 (01/02 0700) Resp:  [13-23] 17 (01/02 0700) BP: (102-150)/(63-96) 143/91 (01/02 0700) SpO2:  [94 %-100 %] 94 % (01/02 0700) Estimated body mass index is 19.76 kg/m as calculated from the following:   Height as of this encounter: 5' 5.98 (1.676 m).   Weight as of this encounter: 55.5 kg.   Intake/Output from previous day: 01/01 0701 - 01/02 0700 In: 306 [P.O.:150; IV Piggyback:156] Out: 726 [Urine:726] Intake/Output this shift: No intake/output data recorded.  Physical exam the patient is alert and oriented.  She is moving all 4 extremities well.  Lab Results: Recent Labs    06/05/23 0637 06/06/23 0627  WBC 7.0 6.1  HGB 11.1* 12.9  HCT 33.9* 38.9  PLT 318 333   BMET Recent Labs    06/04/23 0642 06/05/23 0637  NA 138 135  K 3.6 3.5  CL 107 103  CO2 22 23  GLUCOSE 99 94  BUN 8 6  CREATININE 0.75 0.61  CALCIUM 8.4* 8.7*    Studies/Results: No results found.  Assessment/Plan: Status post subarachnoid hemorrhage, cooling of a comm aneurysm: The patient is doing well neurologically.  I will get a head CT to make sure she has not developed hydrocephalus but I suspect her headaches are secondary to her hemorrhage and will improve with time.  LOS: 3 days     Tiffany [name] 06/06/2023, 7:44 AM     Patient ID: Tiffany [name], female   DOB: [birth year], 38 y.o.   MRN: [id number]
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
## 2023-06-06 NOTE — Progress Notes (Signed)
 Inpatient Rehab Admissions Coordinator:   Per PT recommendations pt was screened for CIR by Reche Lowers, PT, DPT.  Pt's insurance is out of network with [facility].  Recommend TOC evaluate for referrals to other AIRs.   Reche Lowers, PT, DPT Admissions Coordinator [phone] 06/06/23  3:16 PM

## 2023-06-07 ENCOUNTER — Inpatient Hospital Stay (HOSPITAL_COMMUNITY): Payer: BLUE CROSS/BLUE SHIELD

## 2023-06-07 DIAGNOSIS — I608 Other nontraumatic subarachnoid hemorrhage: Secondary | ICD-10-CM

## 2023-06-07 DIAGNOSIS — I609 Nontraumatic subarachnoid hemorrhage, unspecified: Secondary | ICD-10-CM

## 2023-06-07 LAB — GLUCOSE, CAPILLARY
Glucose-Capillary: 129 mg/dL — ABNORMAL HIGH (ref 70–99)
Glucose-Capillary: 145 mg/dL — ABNORMAL HIGH (ref 70–99)
Glucose-Capillary: 195 mg/dL — ABNORMAL HIGH (ref 70–99)
Glucose-Capillary: 97 mg/dL (ref 70–99)

## 2023-06-07 LAB — ECHOCARDIOGRAM COMPLETE
Area-P 1/2: 1.99 cm2
Height: 65.984 in
S' Lateral: 3.1 cm
Weight: 2077.62 [oz_av]

## 2023-06-07 LAB — MAGNESIUM: Magnesium: 2 mg/dL (ref 1.7–2.4)

## 2023-06-07 LAB — BASIC METABOLIC PANEL
Anion gap: 10 (ref 5–15)
BUN: 8 mg/dL (ref 6–20)
CO2: 23 mmol/L (ref 22–32)
Calcium: 9.2 mg/dL (ref 8.9–10.3)
Chloride: 100 mmol/L (ref 98–111)
Creatinine, Ser: 0.77 mg/dL (ref 0.44–1.00)
GFR, Estimated: >60 mL/min (ref >60)
Glucose, Bld: 123 mg/dL — ABNORMAL HIGH (ref 70–99)
Potassium: 3.7 mmol/L (ref 3.5–5.1)
Sodium: 133 mmol/L — ABNORMAL LOW (ref 135–145)

## 2023-06-07 LAB — POCT ACTIVATED CLOTTING TIME
Activated Clotting Time: 147 s
Activated Clotting Time: 187 s

## 2023-06-07 LAB — PHOSPHORUS: Phosphorus: 3.8 mg/dL (ref 2.5–4.6)

## 2023-06-07 MED ORDER — POTASSIUM CHLORIDE CRYS ER 20 MEQ PO TBCR
40.0000 meq | EXTENDED_RELEASE_TABLET | Freq: Once | ORAL | Status: AC
Start: 2023-06-07 — End: 2023-06-07
  Administered 2023-06-07: 40 meq via ORAL
  Filled 2023-06-07: qty 2

## 2023-06-07 MED ORDER — NICOTINE 7 MG/24HR TD PT24
7.0000 mg | MEDICATED_PATCH | Freq: Every day | TRANSDERMAL | Status: DC
  Administered 2023-06-07 – 2023-06-12 (×6): 7 mg via TRANSDERMAL
  Filled 2023-06-07 (×7): qty 1

## 2023-06-07 MED ORDER — POLYETHYLENE GLYCOL 3350 17 G PO PACK
17.0000 g | PACK | Freq: Every day | ORAL | Status: DC
  Administered 2023-06-10 – 2023-06-13 (×3): 17 g via ORAL
  Filled 2023-06-07 (×4): qty 1

## 2023-06-07 NOTE — Progress Notes (Signed)
 Transcranial Doppler  Date POD PCO2 HCT BP  MCA ACA PCA OPHT SIPH VERT Basilar  1/2,rs     Right  Left   87  107   -101  -59   39  22   20  31    *  *   -58  -45   -73      1/3,rs     Right  Left   75  106   -99  -45   35  20   15  20    *  *   -35  -24   -57           Right  Left                                             Right  Left                                             Right  Left                                            Right  Left                                            Right  Left                                        MCA = Middle Cerebral Artery      OPHT = Opthalmic Artery     BASILAR = Basilar Artery   ACA = Anterior Cerebral Artery     SIPH = Carotid Siphon PCA = Posterior Cerebral Artery   VERT = Verterbral Artery                   Normal MCA = 62+\-12 ACA = 50+\-12 PCA = 42+\-23  (*) unable to insonate Right Lindegaard Ratio  2.2, Left Lindegaard Ratio 2.6  06/07/2023  3:17 PM Winnell Bento, Ricka BIRCH

## 2023-06-07 NOTE — Progress Notes (Signed)
 Physical Therapy Treatment Patient Details Name: Tiffany [name] MRN: [id number] DOB: 10/02/1985 Today's Date: 06/07/2023   History of Present Illness Pt is a 38 y/o female presenting to [facility] ED on 12/30 with SAH.  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  PMHx:  marijuana addiction, alcohol abuse, cocaine addiction, TN, concussion, depression    PT Comments  Pt showing improvement toward goals, limited by difficulty maintaining focus on activity, dealing with dysfunctional movements of body and limbs and difficulty with problem solving.  Emphasis today on progression of gait stability/speed, working on ability to scan environment and using cues to navigate the unit   If plan is discharge home, recommend the following: A little help with walking and/or transfers;A little help with bathing/dressing/bathroom;Assistance with cooking/housework;Assistance with feeding;Assist for transportation   Can travel by private vehicle        Equipment Recommendations   (TBD if needs an AD for gait)    Recommendations for Other Services Rehab consult     Precautions / Restrictions Precautions Precautions: Fall     Mobility  Bed Mobility Overal bed mobility: Needs Assistance Bed Mobility: Supine to Sit     Supine to sit: Min assist (later pt slid in the bed prone and rolled around and back to supine in place without assist.  But initially needed min assist from long time in bed.)          Transfers Overall transfer level: Needs assistance Equipment used: 1 person hand held assist Transfers: Sit to/from Stand Sit to Stand: Min assist   Step pivot transfers: Min assist       General transfer comment: cues for hand placement, direction up front, sequence of activity.  Otherwise stability assist.    Ambulation/Gait Ambulation/Gait assistance: Min assist, Mod assist Gait Distance (Feet): 150 Feet (x3 trials.  pt gets  progressively more fidgety and dys kinetic.  Generally moving in non purposeful ways while doing another task like walking.  Resulting in need for steadying assist.) Assistive device: 1 person hand held assist Gait Pattern/deviations: Step-through pattern   Gait velocity interpretation: 1.31 - 2.62 ft/sec, indicative of limited community ambulator   General Gait Details: unsteady gait with narrowed BOS, scissoring, drifting, soft staggering to maintain ambulating straight ahead.  Unable to let pt ambulate without external assist.  May try AD for stability.   Stairs             Wheelchair Mobility     Tilt Bed    Modified Rankin (Stroke Patients Only) Modified Rankin (Stroke Patients Only) Modified Rankin: Moderately severe disability     Balance Overall balance assessment: Needs assistance     Sitting balance - Comments: pt can correct for poor posture to upright sitting for a short time and then she starts writhing again with head and upper body listing forward and to the R   Standing balance support: Single extremity supported, During functional activity Standing balance-Leahy Scale: Poor Standing balance comment: reliant on external support                            Cognition Arousal: Alert Behavior During Therapy: WFL for tasks assessed/performed, Flat affect, Impulsive Overall Cognitive Status: Impaired/Different from baseline (NT formally) Area of Impairment: Attention, Memory, Following commands, Awareness, Problem solving                   Current Attention Level:  Sustained Memory: Decreased short-term memory Following Commands: Follows one step commands with increased time   Awareness: Intellectual Problem Solving: Slow processing, Difficulty sequencing, Requires verbal cues          Exercises      General Comments        Pertinent Vitals/Pain Pain Assessment Pain Assessment: Faces Faces Pain Scale: Hurts even more Pain  Location: head Pain Descriptors / Indicators: Aching Pain Intervention(s): Monitored during session    Home Living                          Prior Function            PT Goals (current goals can now be found in the care plan section) Acute Rehab PT Goals PT Goal Formulation: With patient Time For Goal Achievement: 06/19/23 Potential to Achieve Goals: Good Progress towards PT goals: Progressing toward goals    Frequency    Min 1X/week      PT Plan      Co-evaluation              AM-PAC PT 6 Clicks Mobility   Outcome Measure  Help needed turning from your back to your side while in a flat bed without using bedrails?: A Little Help needed moving from lying on your back to sitting on the side of a flat bed without using bedrails?: A Little Help needed moving to and from a bed to a chair (including a wheelchair)?: A Lot Help needed standing up from a chair using your arms (e.g., wheelchair or bedside chair)?: A Lot Help needed to walk in hospital room?: A Lot Help needed climbing 3-5 steps with a railing? : A Lot 6 Click Score: 14    End of Session     Patient left: in bed;with call bell/phone within reach;with nursing/sitter in room Nurse Communication: Mobility status PT Visit Diagnosis: Unsteadiness on feet (R26.81);Difficulty in walking, not elsewhere classified (R26.2);Other symptoms and signs involving the nervous system (R29.898)     Time: 8550-8490 PT Time Calculation (min) (ACUTE ONLY): 20 min  Charges:    $Gait Training: 8-22 mins PT General Charges $$ ACUTE PT VISIT: 1 Visit                     06/07/2023  Tiffany HERO., PT Acute Rehabilitation Services [phone]  (office)   Tiffany [name] Faye Strohman 06/07/2023, 5:19 PM

## 2023-06-07 NOTE — TOC CM/SW Note (Signed)
 Transition of Care [facility]) - Inpatient Brief Assessment   Patient Details  Name: Tiffany [name] MRN: [id number] Date of Birth: 1985/12/02  Transition of Care [facility]) CM/SW Contact:    Inocente GORMAN Kindle, LCSW Phone Number: 06/07/2023, 10:04 AM   Clinical Narrative: CSW continuing to follow for patient needs. Patient was IVC'd on 06/03/23 and is currently recommended for inpatient psych pending medical stability. IVC would need to be renewed on 06/10/23 if appropriate.    Transition of Care Asessment: Insurance and Status: Insurance coverage has been reviewed Patient has primary care physician: No Home environment has been reviewed: From home Prior level of function:: Independent Prior/Current Home Services: No current home services Social Drivers of Health Review: SDOH reviewed interventions complete Readmission risk has been reviewed: Yes Transition of care needs: transition of care needs identified, TOC will continue to follow

## 2023-06-07 NOTE — Progress Notes (Signed)
 Referring Physician(s): Reyes Budge, MD  Supervising Physician: Everitt Sarks Rodrigues, [place]  Patient Status:  [facility] - In-pt  Chief Complaint:  Aneurysmal subarachnoid hemorrhage s/p primary coil embolization by Dr. everitt Sarks Erichsen on 06/03/23   Subjective:  Patient resting in bed. Safety sitter at the bedside.  Reports left sided HA and pinch in her left leg, around left hip and buttocks area.  Speech is disorganized today.    Allergies: Shellfish allergy, Pollen extract, and Aspirin  Medications: Prior to Admission medications   Medication Sig Start Date End Date Taking? Authorizing Provider  ibuprofen  (ADVIL ) 200 MG tablet Take 400 mg by mouth every 6 (six) hours as needed for headache or mild pain.    [provider]  ibuprofen  (ADVIL ) 600 MG tablet Take 1 tablet (600 mg total) by mouth every 6 (six) hours as needed. 04/03/23   Dean Clarity, MD  methocarbamol  (ROBAXIN ) 500 MG tablet Take 1 tablet (500 mg total) by mouth 2 (two) times daily as needed for muscle spasms. 04/03/23   Dean Clarity, MD  traZODone  (DESYREL ) 50 MG tablet Take 1 tablet (50 mg total) by mouth at bedtime. 09/13/22   Dasie Ellouise CROME, FNP     Vital Signs: BP 124/82 (BP Location: Left Arm)   Pulse 69   Temp 99.7 F (37.6 C) (Oral)   Resp 20   Ht 5' 5.98 (1.676 m)   Wt 129 lb 13.6 oz (58.9 kg)   SpO2 96%   BMI 20.97 kg/m   Physical Exam Constitutional:      General: She is not in acute distress.    Appearance: She is not ill-appearing.  Pulmonary:     Effort: Pulmonary effort is normal.  Musculoskeletal:        General: Normal range of motion.     Cervical back: Neck supple.  Neurological:     Mental Status: She is alert.     Cranial Nerves: No dysarthria or facial asymmetry.     Comments: Speech is disorganized today, patient jumps from one topic to other      Imaging: Portable Chest xray Result Date: 06/04/2023 CLINICAL DATA:  Respiratory failure. EXAM:  PORTABLE CHEST 1 VIEW COMPARISON:  January 27, 2021. FINDINGS: The heart size and mediastinal contours are within normal limits. Endotracheal and nasogastric tubes are in grossly good position. Both lungs are clear. The visualized skeletal structures are unremarkable. IMPRESSION: No active disease. Electronically Signed   By: Lynwood Landy Raddle M.D.   On: 06/04/2023 10:08   DG Abd Portable 1V Result Date: 06/03/2023 CLINICAL DATA:  Orogastric tube placement. EXAM: PORTABLE ABDOMEN - 1 VIEW COMPARISON:  None Available. FINDINGS: Tip and side port of the enteric tube below the diaphragm in the stomach. There is mild gaseous distention of stomach and small bowel centrally. No evidence of free air on this portable supine view. IMPRESSION: Tip and side port of the enteric tube below the diaphragm in the stomach. Electronically Signed   By: Andrea Gasman M.D.   On: 06/03/2023 18:29   CT ANGIO HEAD NECK W WO CM Addendum Date: 06/03/2023 ADDENDUM REPORT: 06/03/2023 05:06 ADDENDUM: Study discussed by telephone with PA ROBERT BROWNING on 06/03/2023 at 0437 hours. Electronically Signed   By: VEAR Hurst M.D.   On: 06/03/2023 05:06   Result Date: 06/03/2023 CLINICAL DATA:  38 year old female with altered mental status. Subarachnoid hemorrhage suspicious for ruptured intracranial aneurysm. EXAM: CT ANGIOGRAPHY HEAD AND NECK WITH AND WITHOUT CONTRAST TECHNIQUE: Multidetector  CT imaging of the head and neck was performed using the standard protocol during bolus administration of intravenous contrast. Multiplanar CT image reconstructions and MIPs were obtained to evaluate the vascular anatomy. Carotid stenosis measurements (when applicable) are obtained utilizing NASCET criteria, using the distal internal carotid diameter as the denominator. RADIATION DOSE REDUCTION: This exam was performed according to the departmental dose-optimization program which includes automated exposure control, adjustment of the mA and/or kV  according to patient size and/or use of iterative reconstruction technique. CONTRAST:  60mL OMNIPAQUE  IOHEXOL  350 MG/ML SOLN COMPARISON:  Head CT 0333 hours today. FINDINGS: CT HEAD Brain: Stable 16 mm area of what appears to be primarily extra-axial hemorrhage in the interhemispheric fissure anterior to the 3rd ventricle (series 5, image 21) and a small amount of additional anterior inter hemispheric subarachnoid blood. Underlying normal cerebral volume. No ventriculomegaly. No IVH is visible. Small volume of additional bilateral basilar cistern blood is suspected, such as elsewhere in the suprasellar and sylvian fissures, stable. No discrete cerebral edema or cortically based infarct identified. Pre medullary cistern and cisterna magna remain normal. Calvarium and skull base: Intact, negative. Paranasal sinuses: Visualized paranasal sinuses and mastoids are stable and well aerated. Orbits: Visualized orbits and scalp soft tissues are within normal limits. CTA NECK Skeleton: No acute osseous abnormality identified. Age advanced cervical disc bulging and endplate spurring. Upper chest: Negative.  Visible upper lungs are clear. Other neck: Negative. Aortic arch: 3 vessel arch.  No arch atherosclerosis. Right carotid system: Negative. Left carotid system: Negative. Vertebral arteries: Mild calcified plaque at the right subclavian artery origin. No stenosis. Normal right vertebral artery origin. Right vertebral artery is patent and within normal limits to the skull base. Proximal left subclavian artery and left vertebral artery origin are normal. Left vertebral artery is codominant, patent within normal limits to the skull base. CTA HEAD Posterior circulation: Codominant distal vertebral arteries, PICA origins, vertebrobasilar junction appear normal. Patent basilar artery without stenosis. Basilar tip, SCA and PCA origins are within normal limits. Diminutive posterior communicating arteries are present. Bilateral PCA  branches are within normal limits. Anterior circulation: Both ICA siphons are patent no discrete siphon plaque, but diminutive appearance of the supraclinoid ICAs probably reflects a degree of vaso spasm. MCA and ACA origins are patent but also diminutive. Anteriorly and superiorly directed saccular aneurysm arises from the anterior communicating artery at the junction with the left ACA (series 9, image 103, series 13, image 88). It is up to 4 mm in length. The tip is bulbous, about 3 mm and mildly irregular. This is directed slightly to the left of midline. Both ACA A2 segments remain patent. Other ACA branches are within normal limits. Left MCA M1 and bifurcation are patent. Right MCA M1 and bifurcation are patent. No MCA aneurysm. Bilateral MCA branches are within normal limits. Venous sinuses: Patent. Anatomic variants: None. Review of the MIP images confirms the above findings IMPRESSION: 1. 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 2. Mild intracranial artery Vasospasm. No large vessel occlusion. No 2nd aneurysm identified. No significant atherosclerosis in the head or neck. 3. Stable small volume acute intracranial hemorrhage from #1. No IVH, ventriculomegaly, midline shift. Electronically Signed: By: VEAR Hurst M.D. On: 06/03/2023 04:37   CT HEAD WO CONTRAST ( ) Result Date: 06/03/2023 CLINICAL DATA:  Mental status change, unknown cause EXAM: CT HEAD WITHOUT CONTRAST TECHNIQUE: Contiguous axial images  were obtained from the base of the skull through the vertex without intravenous contrast. RADIATION DOSE REDUCTION: This exam was performed according to the departmental dose-optimization program which includes automated exposure control, adjustment of the mA and/or kV according to patient size and/or use of iterative reconstruction technique. COMPARISON:  CT head  11/04/2011 FINDINGS: Brain: No evidence of large-territorial acute infarction. No parenchymal hemorrhage. No mass lesion. Parafalcine curvilinear and likely fall or hyperdensity measuring up to 17 mm with associated adjacent bilateral small volume inferior frontal subarachnoid hemorrhage (5:44, 3:22). No hydrocephalus. Basilar cisterns are patent. Vascular: No hyperdense vessel. Skull: No acute fracture or focal lesion. Sinuses/Orbits: Paranasal sinuses and mastoid air cells are clear. The orbits are unremarkable. Other: None. IMPRESSION: Findings suggestive of a bleeding A-comm aneurysm with small bilateral frontal subarachnoid hemorrhage. These results were called by telephone at the time of interpretation on 06/03/2023 at 3:47 am to provider LAMAR SCHLOSSMAN , who verbally acknowledged these results. Electronically Signed   By: Morgane  Naveau M.D.   On: 06/03/2023 03:47    Labs:  CBC: Recent Labs    06/03/23 0148 06/04/23 0642 06/05/23 0637 06/06/23 0627  WBC 7.1 7.9 7.0 6.1  HGB 11.7* 10.7* 11.1* 12.9  HCT 36.4 33.4* 33.9* 38.9  PLT 387 379 318 333    COAGS: Recent Labs    06/03/23 1353  INR 1.1    BMP: Recent Labs    06/03/23 0148 06/04/23 0642 06/05/23 0637 06/06/23 0627  NA 138 138 135 134*  K 3.4* 3.6 3.5 3.5  CL 108 107 103 102  CO2 21* 22 23 23   GLUCOSE 95 99 94 118*  BUN 11 8 6  5*  CALCIUM 8.8* 8.4* 8.7* 9.0  CREATININE 0.69 0.75 0.61 0.67  GFRNONAA >60 >60 >60 >60    LIVER FUNCTION TESTS: Recent Labs    04/03/23 1025 06/03/23 0148  BILITOT 0.6 0.4  AST 19 20  ALT 11 10  ALKPHOS 63 60  PROT 7.4 7.0  ALBUMIN 3.9 3.8    Assessment and Plan:  38 year old female with pertinent PMH polysubstance abuse (marijuana, cocaine), alcohol abuse, depression, PTSD, historical diagnoses of schizophrenia and bipolar (schizoaffective disorder, bipolar type) in a patient with a history of labile moods, violence towards others, and medication non-compliance. She was  brought to ED due to HA, CT head showed common aneurysm with small bilateral frontal SAH. She is s/p ACOM primary coil embolization by Dr. everitt Nile Erichsen on 06/03/23. Neurosurgery is following.   Patient is reporting left sided HA this morning, unable to tell if it worse or better than yesterday. Denies n/v.  Has not be eating much. Also reports pinch in her left hip and buttocks. No other complaints, RN reports no issues overnight.   TCD reviewed by Dr. everitt Nile Erichsen, stable.  NIR will follow.    Electronically Signed:  [employer] H Tin Engram PA-C 06/07/2023 10:31 AM     I spent a total of 15 Minutes at the the patient's bedside AND on the patient's hospital floor or unit, greater than 50% of which was counseling/coordinating care for [facility] s/p coiling.

## 2023-06-07 NOTE — Progress Notes (Addendum)
 NAME:  Tiffany [name], MRN:  [id number], DOB:  1986/05/06, LOS: 4 ADMISSION DATE:  06/03/2023, CONSULTATION DATE:  06/03/2023 REFERRING MD:  Dr. Griselda, CHIEF COMPLAINT:  SAH w/ aneurysm   History of Present Illness:  Patient is a 38 year old female with pertinent PMH polysubstance abuse (marijuana, cocaine), alcohol abuse, depression, and HTN but not on any medications presents to [facility] ED on 12/30 with SAH.  On 12/30 patient complaining of severe headache and called EMS.  On their arrival patient combative and verbally abusive.  Multiple open alcohol bottles on scene.  She stated multiple times that she wanted to go home and kill herself.  Patient brought to [facility] ED.  On arrival patient remained agitated and required Haldol  and soft restraints.  Initial BP 167/102.  Patient protecting airway on room air.  Afebrile.  CT head showing common aneurysm with small bilateral frontal SAH.  Neuro consulted and recommended CTA head.  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 NSG consulted and will plan on coil embolization early this morning. Started on Cleviprex . PCCM to admit.  Pertinent  Medical History   Past Medical History:  Diagnosis Date   Addiction, marijuana (HCC)    Alcohol abuse    Chlamydia    Cocaine addiction (HCC)    Depression    History of concussion    Hypertension    does not take any meds     Significant Hospital Events: Including procedures, antibiotic start and stop dates in addition to other pertinent events   12/30 > cerebral angiogram and endovascular aneurysm embolization with aneurysmal occlusion and IR.  Returns to ICU intubated and sedated 12/31: Extubated  Interim History / Subjective:  Still c/o L sided headache, denies nausea, vomiting Spiked fever with Tmax 100.5  Objective   Blood pressure 131/73, pulse 74, temperature 98.7 F (37.1 C),  temperature source Oral, resp. rate 19, height 5' 5.98 (1.676 m), weight 58.9 kg, SpO2 97%, unknown if currently breastfeeding.        Intake/Output Summary (Last 24 hours) at 06/07/2023 0744 Last data filed at 06/06/2023 1914 Gross per 24 hour  Intake 360 ml  Output 1001 ml  Net -641 ml   Filed Weights   06/04/23 1800 06/05/23 0500 06/06/23 9297  Weight: 57.5 kg 55.5 kg 58.9 kg    Examination: General: Young African-American female, lying on the bed HEENT: [place]/AT, eyes anicteric.  moist mucus membranes Neuro: Alert, awake following commands, antigravity in all 4 extremities.  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 Continue neuro watch every hour Maintain SBP 160-180 Off Cleviprex  Will get echocardiogram Neurosurgery is following CT head was repeated yesterday showed no acute abnormalities Continue Tylenol  and Fioricet  for headache Continue nimodipine  Maintain euvolemia  Hypokalemia Hyponatremia Continue aggressive electrolyte replacement and monitor  Polysubstance abuse Alcoholism Watch for signs of withdrawal Continue thiamine  and folate Continue as needed Ativan   Depression/homicidal ideation Appreciate psychiatry recommendations Thiamine /folate Continue Haldol  if needed Continue trazodone  IVC and suicide 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 (right click and Control And Instrumentation Engineer daily)  Diet/type: Regular consistency (see orders) DVT prophylaxis subcu Lovenox  Pressure ulcer(s): N/A GI prophylaxis: N/A Lines: N/A Foley:  N/A Code Status:  full code Last date of multidisciplinary  goals of care discussion [discussed status with the patient's sisters at bedside 12/31]  Labs   CBC: Recent Labs  Lab 06/03/23 0148 06/04/23 0642 06/05/23 0637 06/06/23 0627  WBC 7.1 7.9 7.0 6.1  NEUTROABS 4.4  --   --   --   HGB 11.7* 10.7* 11.1* 12.9  HCT 36.4 33.4* 33.9* 38.9  MCV 87.3 86.8 86.0 84.2  PLT 387 379 318 333    Basic Metabolic Panel: Recent Labs  Lab 06/03/23 0148 06/04/23 0642 06/05/23 0637 06/06/23 0627  NA 138 138 135 134*  K 3.4* 3.6 3.5 3.5  CL 108 107 103 102  CO2 21* 22 23 23   GLUCOSE 95 99 94 118*  BUN 11 8 6  5*  CREATININE 0.69 0.75 0.61 0.67  CALCIUM 8.8* 8.4* 8.7* 9.0  MG 1.8 2.4  --   --   PHOS  --  4.1  --   --    GFR: Estimated Creatinine Clearance: 88.7 mL/min (by C-G formula based on SCr of 0.67 mg/dL). Recent Labs  Lab 06/03/23 0148 06/04/23 0642 06/05/23 0637 06/06/23 0627  WBC 7.1 7.9 7.0 6.1    Liver Function Tests: Recent Labs  Lab 06/03/23 0148  AST 20  ALT 10  ALKPHOS 60  BILITOT 0.4  PROT 7.0  ALBUMIN 3.8   No results for input(s): LIPASE, AMYLASE in the last 168 hours. No results for input(s): AMMONIA in the last 168 hours.  ABG    Component Value Date/Time   TCO2 27 06/08/2011 1842     Coagulation Profile: Recent Labs  Lab 06/03/23 1353  INR 1.1    Cardiac Enzymes: No results for input(s): CKTOTAL, CKMB, CKMBINDEX, TROPONINI in the last 168 hours.  HbA1C: No results found for: HGBA1C  CBG: Recent Labs  Lab 06/06/23 1134 06/06/23 1613 06/06/23 1955 06/07/23 0002 06/07/23 0351  GLUCAP 123* 129* 122* 195* 129*      Valinda Novas, MD [facility] See Amion for pager If no response to pager, please call [phone] until 7pm After 7pm, Please call E-link [phone]

## 2023-06-07 NOTE — Progress Notes (Signed)
 Subjective: The patient is alert and pleasant.  She complains of a headache.  Objective: Vital signs in last 24 hours: Temp:  [98.7 F (37.1 C)-100.5 F (38.1 C)] 99.1 F (37.3 C) (01/03 0754) Pulse Rate:  [58-95] 74 (01/03 0600) Resp:  [12-30] 19 (01/03 0600) BP: (103-162)/(71-97) 131/73 (01/03 0600) SpO2:  [90 %-100 %] 97 % (01/03 0600) Estimated body mass index is 20.97 kg/m as calculated from the following:   Height as of this encounter: 5' 5.98 (1.676 m).   Weight as of this encounter: 58.9 kg.   Intake/Output from previous day: 01/02 0701 - 01/03 0700 In: 360 [P.O.:360] Out: 1001 [Urine:1001] Intake/Output this shift: No intake/output data recorded.  Physical exam the patient is alert and oriented x 3.  She is moving all 4 extremities well.  I reviewed the patient's follow-up head CT performed yesterday.  It is without any significant change except for her coiling.  There is no hydrocephalus.  Lab Results: Recent Labs    06/05/23 0637 06/06/23 0627  WBC 7.0 6.1  HGB 11.1* 12.9  HCT 33.9* 38.9  PLT 318 333   BMET Recent Labs    06/05/23 0637 06/06/23 0627  NA 135 134*  K 3.5 3.5  CL 103 102  CO2 23 23  GLUCOSE 94 118*  BUN 6 5*  CREATININE 0.61 0.67  CALCIUM 8.7* 9.0    Studies/Results: VAS US  TRANSCRANIAL DOPPLER Result Date: 06/06/2023  Transcranial Doppler Patient Name:  DONNITA FARINA  Date of Exam:   06/06/2023 Medical Rec #: [id number]         Accession #:    [id number] Date of Birth: [birth year]          Patient Gender: F Patient Age:   38 years Exam Location:  [facility] Procedure:      VAS US  TRANSCRANIAL DOPPLER Referring Phys: CURTIS DE MACEDO RODRIGUES --------------------------------------------------------------------------------  Indications: Subarachnoid hemorrhage. 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 Primary coil embolization performed.   Performing Technologist: Ricka Holland RDMS, RVT  Examination Guidelines: A complete evaluation includes B-mode imaging, spectral Doppler, color Doppler, and power Doppler as needed of all accessible portions of each vessel. Bilateral testing is considered an integral part of a complete examination. Limited examinations for reoccurring indications may be performed as noted.  +----------+---------------+----------+-----------+-------------+ RIGHT TCD Right VM (cm/s)Depth (cm)Pulsatility   Comment    +----------+---------------+----------+-----------+-------------+ MCA             87                    0.96                  +----------+---------------+----------+-----------+-------------+ ACA            -101         5.70      0.75                  +----------+---------------+----------+-----------+-------------+ Term ICA        63                    0.99                  +----------+---------------+----------+-----------+-------------+ PCA P1          39                    0.91                  +----------+---------------+----------+-----------+-------------+  Opthalmic       20                    1.06                  +----------+---------------+----------+-----------+-------------+ ICA siphon                                    not insonated +----------+---------------+----------+-----------+-------------+ Vertebral       58                    0.78                  +----------+---------------+----------+-----------+-------------+ Distal ICA      39                                          +----------+---------------+----------+-----------+-------------+  +----------+--------------+----------+-----------+-------------+ LEFT TCD  Left VM (cm/s)Depth (cm)Pulsatility   Comment    +----------+--------------+----------+-----------+-------------+ MCA            107         4.60      0.92                   +----------+--------------+----------+-----------+-------------+ ACA            -59                   1.04                  +----------+--------------+----------+-----------+-------------+ Term ICA        63                   0.93                  +----------+--------------+----------+-----------+-------------+ PCA P1          22                   0.93                  +----------+--------------+----------+-----------+-------------+ Opthalmic       31                   0.99                  +----------+--------------+----------+-----------+-------------+ ICA siphon                                   not insonated +----------+--------------+----------+-----------+-------------+ Vertebral      -45                   0.94                  +----------+--------------+----------+-----------+-------------+ Distal ICA      40                                         +----------+--------------+----------+-----------+-------------+  +------------+-------+-------+             VM cm/sComment +------------+-------+-------+ Prox Basilar  -73          +------------+-------+-------+ Dist  Basilar  -80          +------------+-------+-------+ +----------------------+---+ Right Lindegaard Ratio2.2 +----------------------+---+ +---------------------+---+ Left Lindegaard Ratio2.6 +---------------------+---+    Preliminary    CT HEAD WO CONTRAST ( ) Result Date: 06/06/2023 CLINICAL DATA:  Subarachnoid hemorrhage [facility]).  Follow-up. EXAM: CT HEAD WITHOUT CONTRAST TECHNIQUE: Contiguous axial images were obtained from the base of the skull through the vertex without intravenous contrast. RADIATION DOSE REDUCTION: This exam was performed according to the departmental dose-optimization program which includes automated exposure control, adjustment of the mA and/or kV according to patient size and/or use of iterative reconstruction technique. COMPARISON:  CTA head/neck 06/03/2023.  FINDINGS: Brain: Unchanged acute subarachnoid hemorrhage along the anterior falx with slight intraparenchymal extension into the left basal forebrain. No new hemorrhage or new loss of gray-white differentiation. No hydrocephalus, mass effect or midline shift. 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 Skull: No calvarial fracture or suspicious bone lesion. Skull base is unremarkable. Sinuses/Orbits: No acute finding. Other: None. IMPRESSION: 1. Unchanged acute subarachnoid hemorrhage along the anterior falx with slight intraparenchymal extension into the left basal forebrain. No new hemorrhage or new loss of gray-white differentiation. 2. 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 Electronically Signed   By: Ryan Chess M.D.   On: 06/06/2023 12:24    Assessment/Plan: Status post subarachnoid hemorrhage/coiling day #4: The patient is doing well clinically.  Her TCD's look okay.  Her follow-up head CT looks good.  LOS: 4 days     Reyes JONETTA Budge 06/07/2023, 8:03 AM     Patient ID: [name], female   DOB: Nov 11, 1985, 38 y.o.   MRN: [id number]

## 2023-06-08 DIAGNOSIS — I609 Nontraumatic subarachnoid hemorrhage, unspecified: Secondary | ICD-10-CM | POA: Diagnosis not present

## 2023-06-08 LAB — GLUCOSE, CAPILLARY
Glucose-Capillary: 204 mg/dL — ABNORMAL HIGH (ref 70–99)
Glucose-Capillary: 109 mg/dL — ABNORMAL HIGH (ref 70–99)
Glucose-Capillary: 203 mg/dL — ABNORMAL HIGH (ref 70–99)
Glucose-Capillary: 157 mg/dL — ABNORMAL HIGH (ref 70–99)

## 2023-06-08 MED ORDER — INSULIN ASPART 100 UNIT/ML IJ SOLN
0.0000 [IU] | Freq: Three times a day (TID) | INTRAMUSCULAR | Status: DC
  Administered 2023-06-08 – 2023-06-10 (×4): 1 [IU] via SUBCUTANEOUS

## 2023-06-08 MED ORDER — DEXAMETHASONE 4 MG PO TABS
4.0000 mg | ORAL_TABLET | Freq: Two times a day (BID) | ORAL | Status: DC
  Administered 2023-06-08 – 2023-06-10 (×6): 4 mg via ORAL
  Filled 2023-06-08 (×6): qty 1

## 2023-06-08 MED ORDER — TRAMADOL HCL 50 MG PO TABS
50.0000 mg | ORAL_TABLET | Freq: Four times a day (QID) | ORAL | Status: DC | PRN
  Administered 2023-06-08 – 2023-06-13 (×12): 50 mg via ORAL
  Filled 2023-06-08 (×12): qty 1

## 2023-06-08 MED ORDER — SENNOSIDES-DOCUSATE SODIUM 8.6-50 MG PO TABS
1.0000 | ORAL_TABLET | Freq: Two times a day (BID) | ORAL | Status: DC
  Administered 2023-06-08 – 2023-06-13 (×7): 1 via ORAL
  Filled 2023-06-08 (×9): qty 1

## 2023-06-08 MED ORDER — TRAZODONE HCL 50 MG PO TABS
50.0000 mg | ORAL_TABLET | Freq: Every evening | ORAL | Status: DC | PRN
  Administered 2023-06-08 – 2023-06-09 (×2): 50 mg via ORAL
  Filled 2023-06-08 (×2): qty 1

## 2023-06-08 NOTE — Progress Notes (Signed)
 NAME:  Tiffany [name], MRN:  [id number], DOB:  01/10/1986, LOS: 5 ADMISSION DATE:  06/03/2023, CONSULTATION DATE:  06/03/2023 REFERRING MD:  Dr. Griselda, CHIEF COMPLAINT:  SAH w/ aneurysm   History of Present Illness:  Patient is a 38 year old female with pertinent PMH polysubstance abuse (marijuana, cocaine), alcohol abuse, depression, and HTN but not on any medications presents to [facility] ED on 12/30 with SAH.  On 12/30 patient complaining of severe headache and called EMS.  On their arrival patient combative and verbally abusive.  Multiple open alcohol bottles on scene.  She stated multiple times that she wanted to go home and kill herself.  Patient brought to [facility] ED.  On arrival patient remained agitated and required Haldol  and soft restraints.  Initial BP 167/102.  Patient protecting airway on room air.  Afebrile.  CT head showing common aneurysm with small bilateral frontal SAH.  Neuro consulted and recommended CTA head.  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 NSG consulted and will plan on coil embolization early this morning. Started on Cleviprex . PCCM to admit.  Pertinent  Medical History   Past Medical History:  Diagnosis Date   Addiction, marijuana (HCC)    Alcohol abuse    Chlamydia    Cocaine addiction (HCC)    Depression    History of concussion    Hypertension    does not take any meds     Significant Hospital Events: Including procedures, antibiotic start and stop dates in addition to other pertinent events   12/30 > cerebral angiogram and endovascular aneurysm embolization with aneurysmal occlusion and IR.  Returns to ICU intubated and sedated 12/31: Extubated  Interim History / Subjective:  Patient was very agitated overnight required Haldol  IM x 1 Spiked fever with Tmax of 101.5 Continue to complain of headache, denies nausea, vomiting, chills   Objective    Blood pressure 103/60, pulse 68, temperature 98.7 F (37.1 C), temperature source Oral, resp. rate 17, height 5' 5.98 (1.676 m), weight 58.6 kg, SpO2 99%, unknown if currently breastfeeding.        Intake/Output Summary (Last 24 hours) at 06/08/2023 0858 Last data filed at 06/07/2023 1800 Gross per 24 hour  Intake 240 ml  Output 3 ml  Net 237 ml   Filed Weights   06/05/23 0500 06/06/23 0702 06/08/23 0417  Weight: 55.5 kg 58.9 kg 58.6 kg    Examination: General: Young female, lying on the bed HEENT: /AT, eyes anicteric.  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 Continue neuro watch every hour Maintain SBP 160-180 Continue to complain of headache, switch Fioricet  to tramadol  Continue Tylenol  Echocardiogram showed normal EF with no wall motion abnormalities Neurosurgery and IR is following Continue nimodipine  Maintain euvolemia TCD's were done yesterday, showing normal velocities  Hypokalemia, resolved Hyponatremia Serum potassium is normal, serum sodium remained at 133  Polysubstance abuse Alcoholism No signs of withdrawal Continue thiamine  and folate Continue as needed Ativan   Depression/homicidal ideation Appreciate psychiatry follow-up Thiamine /folate Continue Haldol  if needed Continue trazodone  IVC and suicide  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 (right click and [employer]vertising [employer] Selections daily)   Diet/type: Regular consistency (see orders) DVT prophylaxis subcu Lovenox  Pressure ulcer(s): N/A GI prophylaxis:  N/A Lines: N/A Foley:  N/A Code Status:  full code Last date of multidisciplinary goals of care discussion [discussed status with the patient's sisters at bedside 12/31]  Labs   CBC: Recent Labs  Lab 06/03/23 0148 06/04/23 0642 06/05/23 0637 06/06/23 0627  WBC 7.1 7.9 7.0 6.1  NEUTROABS 4.4  --   --   --   HGB 11.7* 10.7* 11.1* 12.9  HCT 36.4 33.4* 33.9* 38.9  MCV 87.3 86.8 86.0 84.2  PLT 387 379 318 333    Basic Metabolic Panel: Recent Labs  Lab 06/03/23 0148 06/04/23 0642 06/05/23 0637 06/06/23 0627 06/07/23 0846  NA 138 138 135 134* 133*  K 3.4* 3.6 3.5 3.5 3.7  CL 108 107 103 102 100  CO2 21* 22 23 23 23   GLUCOSE 95 99 94 118* 123*  BUN 11 8 6  5* 8  CREATININE 0.69 0.75 0.61 0.67 0.77  CALCIUM 8.8* 8.4* 8.7* 9.0 9.2  MG 1.8 2.4  --   --  2.0  PHOS  --  4.1  --   --  3.8   GFR: Estimated Creatinine Clearance: 88.2 mL/min (by C-G formula based on SCr of 0.77 mg/dL). Recent Labs  Lab 06/03/23 0148 06/04/23 0642 06/05/23 0637 06/06/23 0627  WBC 7.1 7.9 7.0 6.1    Liver Function Tests: Recent Labs  Lab 06/03/23 0148  AST 20  ALT 10  ALKPHOS 60  BILITOT 0.4  PROT 7.0  ALBUMIN 3.8   No results for input(s): LIPASE, AMYLASE in the last 168 hours. No results for input(s): AMMONIA in the last 168 hours.  ABG    Component Value Date/Time   TCO2 27 06/08/2011 1842     Coagulation Profile: Recent Labs  Lab 06/03/23 1353  INR 1.1    Cardiac Enzymes: No results for input(s): CKTOTAL, CKMB, CKMBINDEX, TROPONINI in the last 168 hours.  HbA1C: No results found for: HGBA1C  CBG: Recent Labs  Lab 06/07/23 1204 06/07/23 1623 06/07/23 1952 06/07/23 2310 06/08/23 0413  GLUCAP 97 204* 109* 203* 157*      Valinda Novas, MD [facility] See Amion for pager If no response to pager, please call [phone] until 7pm After 7pm, Please call E-link [phone]

## 2023-06-08 NOTE — Progress Notes (Signed)
  NEUROSURGERY PROGRESS NOTE   Pt seen and examined. No issues overnight. Pt c/o left leg pain and continued HA.  EXAM: Temp:  [98.3 F (36.8 C)-101.5 F (38.6 C)] 98.7 F (37.1 C) (01/04 0746) Pulse Rate:  [58-83] 70 (01/04 0900) Resp:  [13-29] 17 (01/04 0746) BP: (99-144)/(59-107) 112/64 (01/04 0900) SpO2:  [95 %-100 %] 100 % (01/04 0900) Weight:  [58.6 kg] 58.6 kg (01/04 0417) Intake/Output      01/03 0701 01/04 0700 01/04 0701 01/05 0700   P.O. 240    Total Intake(mL/kg) 240 (4.1)    Urine (mL/kg/hr) 2 (0)    Stool 1    Total Output 3    Net +237         Urine Occurrence 1 x     Awake, alert, oriented Speech fluent CN intact 5/5 BUE/BLE  LABS: Lab Results  Component Value Date   CREATININE 0.77 06/07/2023   BUN 8 06/07/2023   NA 133 (L) 06/07/2023   K 3.7 06/07/2023   CL 100 06/07/2023   CO2 23 06/07/2023   Lab Results  Component Value Date   WBC 6.1 06/06/2023   HGB 12.9 06/06/2023   HCT 38.9 06/06/2023   MCV 84.2 06/06/2023   PLT 333 06/06/2023    TCD: Date POD PCO2 HCT BP   MCA ACA PCA OPHT SIPH VERT Basilar  1/2,rs         Right  Left   87  107   -101  -59   39  22   20  31    *  *   -58  -45   -73       1/3,rs         Right  Left   75  106   -99  -45   35  20   15  20    *  *   -35  -24   -57        IMPRESSION: - 38 y.o. female SAH d#5 s/p Acom coiling, neurologically intact. No clinical/sonographic spasm  PLAN: - Cont Nimotop  - Cont to mobilize as tolerataed - Will add dexamethasone  4mg  BID   Gerldine Maizes, MD [facility]
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
## 2023-06-08 NOTE — Progress Notes (Signed)
 12 - RN entered room to find patient extremely agitated, pulling leads and clothes off, standing beside bed and flipping bedside table over to the ground.   0024 - 5mg  haldol  IM given to patient.  Patient hooked back up to monitor, assessed, and currently more calm and resting. VSS.   Care ongoing.  Berneda Essex, RN

## 2023-06-08 NOTE — Progress Notes (Signed)
 Referring Physician(s): Mavis Purchase, MD  Supervising Physician: De Macedo Rodrigues, [place]  Patient Status:  [facility] - In-pt  Chief Complaint: Follow up [facility] s/p primary coiling embolization in NIR 06/03/23  Subjective:  Patient somnolent, has blankets pulled up over head and face, when asked if she has headache she says yes - unable to characterize further, does not answer other questions. Per chart review patient agitated overnight removing leads/clothes, turning over furniture and required Haldol .   Allergies: Shellfish allergy, Pollen extract, and Aspirin  Medications: Prior to Admission medications   Medication Sig Start Date End Date Taking? Authorizing Provider  ibuprofen  (ADVIL ) 200 MG tablet Take 400 mg by mouth every 6 (six) hours as needed for headache or mild pain.    [provider]  ibuprofen  (ADVIL ) 600 MG tablet Take 1 tablet (600 mg total) by mouth every 6 (six) hours as needed. 04/03/23   Dean Clarity, MD  methocarbamol  (ROBAXIN ) 500 MG tablet Take 1 tablet (500 mg total) by mouth 2 (two) times daily as needed for muscle spasms. 04/03/23   Dean Clarity, MD  traZODone  (DESYREL ) 50 MG tablet Take 1 tablet (50 mg total) by mouth at bedtime. 09/13/22   Dasie Ellouise CROME, FNP     Vital Signs: BP 112/64   Pulse 70   Temp 98.7 F (37.1 C) (Oral)   Resp 17   Ht 5' 5.98 (1.676 m)   Wt 129 lb 3 oz (58.6 kg)   SpO2 100%   BMI 20.86 kg/m   Physical Exam Vitals reviewed.  Constitutional:      General: She is not in acute distress.    Comments: Somnolent but arousable, minimally interactive. Does not allow me to remover covers from face/body.   Cardiovascular:     Rate and Rhythm: Normal rate.  Pulmonary:     Effort: Pulmonary effort is normal.     Imaging: ECHOCARDIOGRAM COMPLETE Result Date: 06/07/2023    ECHOCARDIOGRAM REPORT   Patient Name:   KELILAH HEBARD Date of Exam: 06/07/2023 Medical Rec #:  [id number]        Height:       66.0 in  Accession #:    [id number]       Weight:       129.9 lb Date of Birth:  06-Jul-1985         BSA:          1.664 m Patient Age:    38 years         BP:           111/66 mmHg Patient Gender: F                HR:           66 bpm. Exam Location:  Inpatient Procedure: 2D Echo, Cardiac Doppler and Color Doppler Indications:    Stroke I63.9  History:        Patient has no prior history of Echocardiogram examinations.  Sonographer:    Tinnie Gosling RDCS Referring Phys: [id number] SUDHAM CHAND IMPRESSIONS  1. Left ventricular ejection fraction, by estimation, is 60 to 65%. The left ventricle has normal function. The left ventricle has no regional wall motion abnormalities. Left ventricular diastolic parameters were normal.  2. Right ventricular systolic function is normal. The right ventricular size is mildly enlarged.  3. The mitral valve is normal in structure. No evidence of mitral valve regurgitation. No evidence of mitral stenosis.  4. The aortic valve is  normal in structure. Aortic valve regurgitation is not visualized. No aortic stenosis is present.  5. The inferior vena cava is normal in size with greater than 50% respiratory variability, suggesting right atrial pressure of 3 mmHg. FINDINGS  Left Ventricle: Left ventricular ejection fraction, by estimation, is 60 to 65%. The left ventricle has normal function. The left ventricle has no regional wall motion abnormalities. The left ventricular internal cavity size was normal in size. There is  no left ventricular hypertrophy. Left ventricular diastolic parameters were normal. Right Ventricle: The right ventricular size is mildly enlarged. No increase in right ventricular wall thickness. Right ventricular systolic function is normal. Left Atrium: Left atrial size was normal in size. Right Atrium: Right atrial size was normal in size. Pericardium: There is no evidence of pericardial effusion. Mitral Valve: The mitral valve is normal in structure. No evidence of mitral valve  regurgitation. No evidence of mitral valve stenosis. Tricuspid Valve: The tricuspid valve is normal in structure. Tricuspid valve regurgitation is not demonstrated. No evidence of tricuspid stenosis. Aortic Valve: The aortic valve is normal in structure. There is mild aortic valve annular calcification. Aortic valve regurgitation is not visualized. No aortic stenosis is present. Pulmonic Valve: The pulmonic valve was normal in structure. Pulmonic valve regurgitation is not visualized. No evidence of pulmonic stenosis. Aorta: The aortic root is normal in size and structure. Venous: The inferior vena cava is normal in size with greater than 50% respiratory variability, suggesting right atrial pressure of 3 mmHg. IAS/Shunts: The interatrial septum was not well visualized.  LEFT VENTRICLE PLAX 2D LVIDd:         4.40 cm   Diastology LVIDs:         3.10 cm   LV e' medial:    11.30 cm/s LV PW:         0.60 cm   LV E/e' medial:  5.1 LV IVS:        0.60 cm   LV e' lateral:   12.90 cm/s LVOT diam:     1.90 cm   LV E/e' lateral: 4.5 LV SV:         51 LV SV Index:   31 LVOT Area:     2.84 cm  RIGHT VENTRICLE             IVC RV S prime:     11.10 cm/s  IVC diam: 0.70 cm TAPSE (M-mode): 1.6 cm LEFT ATRIUM           Index        RIGHT ATRIUM          Index LA diam:      2.10 cm 1.26 cm/m   RA Area:     9.64 cm LA Vol (A4C): 24.3 ml 14.60 ml/m  RA Volume:   18.00 ml 10.81 ml/m  AORTIC VALVE LVOT Vmax:   117.00 cm/s LVOT Vmean:  81.300 cm/s LVOT VTI:    0.180 m  AORTA Ao Root diam: 2.70 cm MITRAL VALVE MV Area (PHT): 1.99 cm    SHUNTS MV Decel Time: 382 msec    Systemic VTI:  0.18 m MV E velocity: 57.50 cm/s  Systemic Diam: 1.90 cm MV A velocity: 46.70 cm/s MV E/A ratio:  1.23 Aditya Sabharwal Electronically signed by Ria Commander Signature Date/Time: 06/07/2023/4:44:06 PM    Final    VAS US  TRANSCRANIAL DOPPLER Result Date: 06/07/2023  Transcranial Doppler Patient Name:  ADAYSHA DUBINSKY  Date of Exam:   06/07/2023  Medical  Rec #: [id number]         Accession #:    [id number] Date of Birth: [birth year]          Patient Gender: F Patient Age:   31 years Exam Location:  [facility] Procedure:      VAS US  TRANSCRANIAL DOPPLER Referring Phys: CURTIS DE MACEDO RODRIGUES --------------------------------------------------------------------------------  Indications: Subarachnoid hemorrhage. 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 Primary coil embolization performed.  Performing Technologist: Ricka Holland RDMS, RVT  Examination Guidelines: A complete evaluation includes B-mode imaging, spectral Doppler, color Doppler, and power Doppler as needed of all accessible portions of each vessel. Bilateral testing is considered an integral part of a complete examination. Limited examinations for reoccurring indications may be performed as noted.  +----------+---------------+----------+-----------+-------------+ RIGHT TCD Right VM (cm/s)Depth (cm)Pulsatility   Comment    +----------+---------------+----------+-----------+-------------+ MCA             75                                          +----------+---------------+----------+-----------+-------------+ ACA             -99         6.30      0.65                  +----------+---------------+----------+-----------+-------------+ Term ICA        54                    1.00                  +----------+---------------+----------+-----------+-------------+ PCA P1          35                    0.55                  +----------+---------------+----------+-----------+-------------+ Opthalmic       15                    0.69                  +----------+---------------+----------+-----------+-------------+ ICA siphon                                    not insonated +----------+---------------+----------+-----------+-------------+ Vertebral       -35                   0.90                   +----------+---------------+----------+-----------+-------------+ Distal ICA      33                                          +----------+---------------+----------+-----------+-------------+  +----------+--------------+----------+-----------+-------------+ LEFT TCD  Left VM (cm/s)Depth (cm)Pulsatility   Comment    +----------+--------------+----------+-----------+-------------+ MCA            106                   0.87                  +----------+--------------+----------+-----------+-------------+ ACA            -  45                   0.93                  +----------+--------------+----------+-----------+-------------+ Term ICA        51                   0.85                  +----------+--------------+----------+-----------+-------------+ PCA P1          20                   0.91                  +----------+--------------+----------+-----------+-------------+ Opthalmic       20                   1.26                  +----------+--------------+----------+-----------+-------------+ ICA siphon                                   not insonated +----------+--------------+----------+-----------+-------------+ Vertebral      -24                   1.21                  +----------+--------------+----------+-----------+-------------+ Distal ICA      40                                         +----------+--------------+----------+-----------+-------------+  +------------+-------+-------+             VM cm/sComment +------------+-------+-------+ Prox Basilar  -40          +------------+-------+-------+ Dist Basilar  -57          +------------+-------+-------+ +----------------------+---+ Right Lindegaard Ratio2.2 +----------------------+---+ +---------------------+----+ Left Lindegaard Ratio2.65 +---------------------+----+    Preliminary    VAS US  TRANSCRANIAL DOPPLER Result Date: 06/07/2023  Transcranial Doppler Patient Name:  BAILEA BEED  Date of Exam:   06/06/2023 Medical Rec #: [id number]         Accession #:    [id number] Date of Birth: 06-01-1986          Patient Gender: F Patient Age:   4 years Exam Location:  [facility] Procedure:      VAS US  TRANSCRANIAL DOPPLER Referring Phys: CURTIS DE MACEDO RODRIGUES --------------------------------------------------------------------------------  Indications: Subarachnoid hemorrhage. 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 Primary coil embolization performed.  Performing Technologist: Ricka Holland RDMS, RVT  Examination Guidelines: A complete evaluation includes B-mode imaging, spectral Doppler, color Doppler, and power Doppler as needed of all accessible portions of each vessel. Bilateral testing is considered an integral part of a complete examination. Limited examinations for reoccurring indications may be performed as noted.  +----------+---------------+----------+-----------+-------------+ RIGHT TCD Right VM (cm/s)Depth (cm)Pulsatility   Comment    +----------+---------------+----------+-----------+-------------+ MCA             87                    0.96                  +----------+---------------+----------+-----------+-------------+  ACA            -101         5.70      0.75                  +----------+---------------+----------+-----------+-------------+ Term ICA        63                    0.99                  +----------+---------------+----------+-----------+-------------+ PCA P1          39                    0.91                  +----------+---------------+----------+-----------+-------------+ Opthalmic       20                    1.06                  +----------+---------------+----------+-----------+-------------+ ICA siphon                                    not insonated +----------+---------------+----------+-----------+-------------+  Vertebral       58                    0.78                  +----------+---------------+----------+-----------+-------------+ Distal ICA      39                                          +----------+---------------+----------+-----------+-------------+  +----------+--------------+----------+-----------+-------------+ LEFT TCD  Left VM (cm/s)Depth (cm)Pulsatility   Comment    +----------+--------------+----------+-----------+-------------+ MCA            107         4.60      0.92                  +----------+--------------+----------+-----------+-------------+ ACA            -59                   1.04                  +----------+--------------+----------+-----------+-------------+ Term ICA        63                   0.93                  +----------+--------------+----------+-----------+-------------+ PCA P1          22                   0.93                  +----------+--------------+----------+-----------+-------------+ Opthalmic       31                   0.99                  +----------+--------------+----------+-----------+-------------+ ICA siphon  not insonated +----------+--------------+----------+-----------+-------------+ Vertebral      -45                   0.94                  +----------+--------------+----------+-----------+-------------+ Distal ICA      40                                         +----------+--------------+----------+-----------+-------------+  +------------+-------+-------+             VM cm/sComment +------------+-------+-------+ Prox Basilar  -73          +------------+-------+-------+ Dist Basilar  -80          +------------+-------+-------+ +----------------------+---+ Right Lindegaard Ratio2.2 +----------------------+---+ +---------------------+---+ Left Lindegaard Ratio2.6 +---------------------+---+  Summary:  Elevated mean flow velocities in left middle  cerebral, right anterior cerebral and distal basilar arteries suggestive of mild vasospasm.Mildly elevated right middle cerebral artery mean flow velocities of unclear significance *See table(s) above for TCD measurements and observations.  Diagnosing physician: Eather Popp MD Electronically signed by Eather Popp MD on 06/07/2023 at 12:03:05 PM.    Final    CT HEAD WO CONTRAST ( ) Result Date: 06/06/2023 CLINICAL DATA:  Subarachnoid hemorrhage [facility]).  Follow-up. EXAM: CT HEAD WITHOUT CONTRAST TECHNIQUE: Contiguous axial images were obtained from the base of the skull through the vertex without intravenous contrast. RADIATION DOSE REDUCTION: This exam was performed according to the departmental dose-optimization program which includes automated exposure control, adjustment of the mA and/or kV according to patient size and/or use of iterative reconstruction technique. COMPARISON:  CTA head/neck 06/03/2023. FINDINGS: Brain: Unchanged acute subarachnoid hemorrhage along the anterior falx with slight intraparenchymal extension into the left basal forebrain. No new hemorrhage or new loss of gray-white differentiation. No hydrocephalus, mass effect or midline shift. 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 Skull: No calvarial fracture or suspicious bone lesion. Skull base is unremarkable. Sinuses/Orbits: No acute finding. Other: None. IMPRESSION: 1. Unchanged acute subarachnoid hemorrhage along the anterior falx with slight intraparenchymal extension into the left basal forebrain. No new hemorrhage or new loss of gray-white differentiation. 2. 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 Electronically Signed   By: Ryan Chess M.D.   On: 06/06/2023 12:24    Labs:  CBC: Recent Labs    06/03/23 0148 06/04/23 0642 06/05/23 0637 06/06/23 0627  WBC 7.1 7.9 7.0 6.1  HGB 11.7* 10.7* 11.1* 12.9  HCT 36.4 33.4* 33.9* 38.9  PLT 387 379 318  333    COAGS: Recent Labs    06/03/23 1353  INR 1.1    BMP: Recent Labs    06/04/23 0642 06/05/23 0637 06/06/23 0627 06/07/23 0846  NA 138 135 134* 133*  K 3.6 3.5 3.5 3.7  CL 107 103 102 100  CO2 22 23 23 23   GLUCOSE 99 94 118* 123*  BUN 8 6 5* 8  CALCIUM 8.4* 8.7* 9.0 9.2  CREATININE 0.75 0.61 0.67 0.77  GFRNONAA >60 >60 >60 >60    LIVER FUNCTION TESTS: Recent Labs    04/03/23 1025 06/03/23 0148  BILITOT 0.6 0.4  AST 19 20  ALT 11 10  ALKPHOS 63 60  PROT 7.4 7.0  ALBUMIN 3.9 3.8    Assessment and Plan:  38 year old female with pertinent PMH polysubstance abuse (marijuana, cocaine), alcohol abuse, depression, PTSD, historical diagnoses  of schizophrenia and bipolar (schizoaffective disorder, bipolar type) in a patient with a history of labile moods, violence towards others, and medication non-compliance. She was brought to ED due to HA, CT head showed common aneurysm with small bilateral frontal SAH. She is s/p ACOM primary coil embolization by Dr. everitt Nile Erichsen on 06/03/23. Neurosurgery is following.   Patient continues to report headache but was unable/unwilling to characterize this further for me today. Per CCM and neurosurgery notes also reported HA as well as left leg pain, does not appear she has been complained of worsening or different HA.  Continue current plan of care, NIR will continue to follow along.  Electronically Signed: Clotilda DELENA Hesselbach, PA-C 06/08/2023, 9:48 AM   I spent a total of 15 Minutes at the the patient's bedside AND on the patient's hospital floor or unit, greater than 50% of which was counseling/coordinating care for [facility].

## 2023-06-09 LAB — GLUCOSE, CAPILLARY
Glucose-Capillary: 97 mg/dL (ref 70–99)
Glucose-Capillary: 120 mg/dL — ABNORMAL HIGH (ref 70–99)
Glucose-Capillary: 122 mg/dL — ABNORMAL HIGH (ref 70–99)

## 2023-06-09 MED ORDER — LACTULOSE 10 GM/15ML PO SOLN
20.0000 g | Freq: Two times a day (BID) | ORAL | Status: AC
Start: 2023-06-09 — End: 2023-06-09
  Administered 2023-06-09 (×2): 20 g via ORAL
  Filled 2023-06-09: qty 30

## 2023-06-09 NOTE — Progress Notes (Signed)
 NAME:  Tiffany [name], MRN:  [id number], DOB:  1985-07-10, LOS: 6 ADMISSION DATE:  06/03/2023, CONSULTATION DATE:  06/03/2023 REFERRING MD:  Dr. Griselda, CHIEF COMPLAINT:  SAH w/ aneurysm   History of Present Illness:  Patient is a 38 year old female with pertinent PMH polysubstance abuse (marijuana, cocaine), alcohol abuse, depression, and HTN but not on any medications presents to [facility] ED on 12/30 with SAH.  On 12/30 patient complaining of severe headache and called EMS.  On their arrival patient combative and verbally abusive.  Multiple open alcohol bottles on scene.  She stated multiple times that she wanted to go home and kill herself.  Patient brought to [facility] ED.  On arrival patient remained agitated and required Haldol  and soft restraints.  Initial BP 167/102.  Patient protecting airway on room air.  Afebrile.  CT head showing common aneurysm with small bilateral frontal SAH.  Neuro consulted and recommended CTA head.  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 NSG consulted and will plan on coil embolization early this morning. Started on Cleviprex . PCCM to admit.  Pertinent  Medical History   Past Medical History:  Diagnosis Date   Addiction, marijuana (HCC)    Alcohol abuse    Chlamydia    Cocaine addiction (HCC)    Depression    History of concussion    Hypertension    does not take any meds     Significant Hospital Events: Including procedures, antibiotic start and stop dates in addition to other pertinent events   12/30 > cerebral angiogram and endovascular aneurysm embolization with aneurysmal occlusion and IR.  Returns to ICU intubated and sedated 12/31: Extubated  Interim History / Subjective:  No overnight issues, stated headache is better although continue to complain of left leg pain starting from the lower back all the way down the back of the thigh, not  reproducible Remain afebrile Walked in the hallway yesterday  Objective   Blood pressure 102/70, pulse 69, temperature 98.4 F (36.9 C), temperature source Axillary, resp. rate (!) 22, height 5' 5.98 (1.676 m), weight 58.6 kg, SpO2 99%, unknown if currently breastfeeding.        Intake/Output Summary (Last 24 hours) at 06/09/2023 0736 Last data filed at 06/08/2023 1842 Gross per 24 hour  Intake 480 ml  Output --  Net 480 ml   Filed Weights   06/05/23 0500 06/06/23 0702 06/08/23 0417  Weight: 55.5 kg 58.9 kg 58.6 kg    Examination: General: Young African-American female, lying on the bed HEENT: [place]/AT, eyes anicteric.  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 due to ruptured ACOM aneurysm status post coiling by NIR Continue neuro watch every hour Patient stated headache is better Continue Tylenol , tramadol  and dexamethasone  4 mg twice daily was added yesterday Echocardiogram showed normal EF with no wall motion abnormalities Neurosurgery and NIR is following Continue nimodipine  Maintain euvolemia TCD's tomorrow Encourage ambulation  Hyponatremia Repeat BMP in the morning  Polysubstance abuse Alcoholism No signs of withdrawal Continue thiamine  and  folate Continue as needed Ativan   Depression/homicidal ideation Appreciate psychiatry follow-up Thiamine /folate Continue Haldol  if needed Continue trazodone  IVC and suicide sitter until she can contract for safety Patient will need inpatient psychiatric admission after medically  cleared   Best Practice (right click and Reselect all SmartList Selections daily)   Diet/type: Regular consistency (see orders) DVT prophylaxis subcu Lovenox  Pressure ulcer(s): N/A GI prophylaxis: N/A Lines: N/A Foley:  N/A Code Status:  full code Last date of multidisciplinary goals of care discussion [discussed status with the patient's sisters at bedside 12/31]  Labs   CBC: Recent Labs  Lab 06/03/23 0148 06/04/23 0642 06/05/23 0637 06/06/23 0627  WBC 7.1 7.9 7.0 6.1  NEUTROABS 4.4  --   --   --   HGB 11.7* 10.7* 11.1* 12.9  HCT 36.4 33.4* 33.9* 38.9  MCV 87.3 86.8 86.0 84.2  PLT 387 379 318 333    Basic Metabolic Panel: Recent Labs  Lab 06/03/23 0148 06/04/23 0642 06/05/23 0637 06/06/23 0627 06/07/23 0846  NA 138 138 135 134* 133*  K 3.4* 3.6 3.5 3.5 3.7  CL 108 107 103 102 100  CO2 21* 22 23 23 23   GLUCOSE 95 99 94 118* 123*  BUN 11 8 6  5* 8  CREATININE 0.69 0.75 0.61 0.67 0.77  CALCIUM 8.8* 8.4* 8.7* 9.0 9.2  MG 1.8 2.4  --   --  2.0  PHOS  --  4.1  --   --  3.8   GFR: Estimated Creatinine Clearance: 88.2 mL/min (by C-G formula based on SCr of 0.77 mg/dL). Recent Labs  Lab 06/03/23 0148 06/04/23 0642 06/05/23 0637 06/06/23 0627  WBC 7.1 7.9 7.0 6.1    Liver Function Tests: Recent Labs  Lab 06/03/23 0148  AST 20  ALT 10  ALKPHOS 60  BILITOT 0.4  PROT 7.0  ALBUMIN 3.8   No results for input(s): LIPASE, AMYLASE in the last 168 hours. No results for input(s): AMMONIA in the last 168 hours.  ABG    Component Value Date/Time   TCO2 27 06/08/2011 1842     Coagulation Profile: Recent Labs  Lab 06/03/23 1353  INR 1.1    Cardiac Enzymes: No results for input(s): CKTOTAL, CKMB, CKMBINDEX, TROPONINI in the last 168 hours.  HbA1C: No results found for: HGBA1C  CBG: Recent Labs  Lab 06/07/23 1204 06/07/23 1623 06/07/23 1952 06/07/23 2310 06/08/23 0413  GLUCAP 97 204* 109* 203* 157*      Valinda Novas,  MD  Pulmonary Critical Care See Amion for pager If no response to pager, please call [phone] until 7pm After 7pm, Please call E-link [phone]

## 2023-06-09 NOTE — Progress Notes (Signed)
 Referring Physician(s): Mavis Purchase, MD  Supervising Physician: De Macedo Rodrigues, [place]  Patient Status:  Tiffany [name] - In-pt  Chief Complaint: Follow up [name] s/p primary coiling embolization in NIR 06/03/23  Subjective:  Patient little more interactive today, states headache is better but left leg pain is the same. Denies any other complaints.  Allergies: Shellfish allergy, Pollen extract, and Aspirin  Medications: Prior to Admission medications   Medication Sig Start Date End Date Taking? Authorizing Provider  ibuprofen  (ADVIL ) 200 MG tablet Take 400 mg by mouth every 6 (six) hours as needed for headache or mild pain.    [provider]  ibuprofen  (ADVIL ) 600 MG tablet Take 1 tablet (600 mg total) by mouth every 6 (six) hours as needed. 04/03/23   Dean Clarity, MD  methocarbamol  (ROBAXIN ) 500 MG tablet Take 1 tablet (500 mg total) by mouth 2 (two) times daily as needed for muscle spasms. 04/03/23   Dean Clarity, MD  traZODone  (DESYREL ) 50 MG tablet Take 1 tablet (50 mg total) by mouth at bedtime. 09/13/22   Dasie Ellouise CROME, FNP     Vital Signs: BP 102/69   Pulse 69   Temp 98.4 F (36.9 C) (Axillary)   Resp (!) 22   Ht 5' 5.98 (1.676 m)   Wt 129 lb 3 oz (58.6 kg)   SpO2 99%   BMI 20.86 kg/m   Physical Exam Vitals and nursing note reviewed.  Constitutional:      General: She is not in acute distress. HENT:     Head: Normocephalic.  Cardiovascular:     Rate and Rhythm: Normal rate.  Pulmonary:     Effort: Pulmonary effort is normal.  Skin:    General: Skin is warm and dry.  Neurological:     Mental Status: She is alert. Mental status is at baseline.     Imaging: ECHOCARDIOGRAM COMPLETE Result Date: 06/07/2023    ECHOCARDIOGRAM REPORT   Patient Name:   Tiffany [name] Date of Exam: 06/07/2023 Medical Rec #:  [id number]        Height:       66.0 in Accession #:    [id number]       Weight:       129.9 lb Date of Birth:  April 05, 1986         BSA:           1.664 m Patient Age:    38 years         BP:           111/66 mmHg Patient Gender: F                HR:           66 bpm. Exam Location:  Inpatient Procedure: 2D Echo, Cardiac Doppler and Color Doppler Indications:    Stroke I63.9  History:        Patient has no prior history of Echocardiogram examinations.  Sonographer:    Tinnie Gosling RDCS Referring Phys: [id number] SUDHAM CHAND IMPRESSIONS  1. Left ventricular ejection fraction, by estimation, is 60 to 65%. The left ventricle has normal function. The left ventricle has no regional wall motion abnormalities. Left ventricular diastolic parameters were normal.  2. Right ventricular systolic function is normal. The right ventricular size is mildly enlarged.  3. The mitral valve is normal in structure. No evidence of mitral valve regurgitation. No evidence of mitral stenosis.  4. The aortic valve is normal in structure. Aortic valve  regurgitation is not visualized. No aortic stenosis is present.  5. The inferior vena cava is normal in size with greater than 50% respiratory variability, suggesting right atrial pressure of 3 mmHg. FINDINGS  Left Ventricle: Left ventricular ejection fraction, by estimation, is 60 to 65%. The left ventricle has normal function. The left ventricle has no regional wall motion abnormalities. The left ventricular internal cavity size was normal in size. There is  no left ventricular hypertrophy. Left ventricular diastolic parameters were normal. Right Ventricle: The right ventricular size is mildly enlarged. No increase in right ventricular wall thickness. Right ventricular systolic function is normal. Left Atrium: Left atrial size was normal in size. Right Atrium: Right atrial size was normal in size. Pericardium: There is no evidence of pericardial effusion. Mitral Valve: The mitral valve is normal in structure. No evidence of mitral valve regurgitation. No evidence of mitral valve stenosis. Tricuspid Valve: The tricuspid valve is normal  in structure. Tricuspid valve regurgitation is not demonstrated. No evidence of tricuspid stenosis. Aortic Valve: The aortic valve is normal in structure. There is mild aortic valve annular calcification. Aortic valve regurgitation is not visualized. No aortic stenosis is present. Pulmonic Valve: The pulmonic valve was normal in structure. Pulmonic valve regurgitation is not visualized. No evidence of pulmonic stenosis. Aorta: The aortic root is normal in size and structure. Venous: The inferior vena cava is normal in size with greater than 50% respiratory variability, suggesting right atrial pressure of 3 mmHg. IAS/Shunts: The interatrial septum was not well visualized.  LEFT VENTRICLE PLAX 2D LVIDd:         4.40 cm   Diastology LVIDs:         3.10 cm   LV e' medial:    11.30 cm/s LV PW:         0.60 cm   LV E/e' medial:  5.1 LV IVS:        0.60 cm   LV e' lateral:   12.90 cm/s LVOT diam:     1.90 cm   LV E/e' lateral: 4.5 LV SV:         51 LV SV Index:   31 LVOT Area:     2.84 cm  RIGHT VENTRICLE             IVC RV S prime:     11.10 cm/s  IVC diam: 0.70 cm TAPSE (M-mode): 1.6 cm LEFT ATRIUM           Index        RIGHT ATRIUM          Index LA diam:      2.10 cm 1.26 cm/m   RA Area:     9.64 cm LA Vol (A4C): 24.3 ml 14.60 ml/m  RA Volume:   18.00 ml 10.81 ml/m  AORTIC VALVE LVOT Vmax:   117.00 cm/s LVOT Vmean:  81.300 cm/s LVOT VTI:    0.180 m  AORTA Ao Root diam: 2.70 cm MITRAL VALVE MV Area (PHT): 1.99 cm    SHUNTS MV Decel Time: 382 msec    Systemic VTI:  0.18 m MV E velocity: 57.50 cm/s  Systemic Diam: 1.90 cm MV A velocity: 46.70 cm/s MV E/A ratio:  1.23 Aditya Sabharwal Electronically signed by Ria Commander Signature Date/Time: 06/07/2023/4:44:06 PM    Final    VAS US  TRANSCRANIAL DOPPLER Result Date: 06/07/2023  Transcranial Doppler Patient Name:  Tiffany [name]  Date of Exam:   06/07/2023 Medical Rec #: [id number]  Accession #:    [id number] Date of Birth: April 23, 1986          Patient Gender:  F Patient Age:   38 years Exam Location:  [facility] Procedure:      VAS US  TRANSCRANIAL DOPPLER Referring Phys: CURTIS DE MACEDO RODRIGUES --------------------------------------------------------------------------------  Indications: Subarachnoid hemorrhage. 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 Primary coil embolization performed.  Performing Technologist: Ricka Holland RDMS, RVT  Examination Guidelines: A complete evaluation includes B-mode imaging, spectral Doppler, color Doppler, and power Doppler as needed of all accessible portions of each vessel. Bilateral testing is considered an integral part of a complete examination. Limited examinations for reoccurring indications may be performed as noted.  +----------+---------------+----------+-----------+-------------+ RIGHT TCD Right VM (cm/s)Depth (cm)Pulsatility   Comment    +----------+---------------+----------+-----------+-------------+ MCA             75                                          +----------+---------------+----------+-----------+-------------+ ACA             -99         6.30      0.65                  +----------+---------------+----------+-----------+-------------+ Term ICA        54                    1.00                  +----------+---------------+----------+-----------+-------------+ PCA P1          35                    0.55                  +----------+---------------+----------+-----------+-------------+ Opthalmic       15                    0.69                  +----------+---------------+----------+-----------+-------------+ ICA siphon                                    not insonated +----------+---------------+----------+-----------+-------------+ Vertebral       -35                   0.90                  +----------+---------------+----------+-----------+-------------+ Distal ICA      33                                           +----------+---------------+----------+-----------+-------------+  +----------+--------------+----------+-----------+-------------+ LEFT TCD  Left VM (cm/s)Depth (cm)Pulsatility   Comment    +----------+--------------+----------+-----------+-------------+ MCA            106                   0.87                  +----------+--------------+----------+-----------+-------------+ ACA            -45  0.93                  +----------+--------------+----------+-----------+-------------+ Term ICA        51                   0.85                  +----------+--------------+----------+-----------+-------------+ PCA P1          20                   0.91                  +----------+--------------+----------+-----------+-------------+ Opthalmic       20                   1.26                  +----------+--------------+----------+-----------+-------------+ ICA siphon                                   not insonated +----------+--------------+----------+-----------+-------------+ Vertebral      -24                   1.21                  +----------+--------------+----------+-----------+-------------+ Distal ICA      40                                         +----------+--------------+----------+-----------+-------------+  +------------+-------+-------+             VM cm/sComment +------------+-------+-------+ Prox Basilar  -40          +------------+-------+-------+ Dist Basilar  -57          +------------+-------+-------+ +----------------------+---+ Right Lindegaard Ratio2.2 +----------------------+---+ +---------------------+----+ Left Lindegaard Ratio2.65 +---------------------+----+    Preliminary    VAS US  TRANSCRANIAL DOPPLER Result Date: 06/07/2023  Transcranial Doppler Patient Name:  Tiffany [name]  Date of Exam:   06/06/2023 Medical Rec #: [id number]         Accession #:    [id number] Date of Birth: [birth year]          Patient  Gender: F Patient Age:   15 years Exam Location:  [facility] Procedure:      VAS US  TRANSCRANIAL DOPPLER Referring Phys: CURTIS DE MACEDO RODRIGUES --------------------------------------------------------------------------------  Indications: Subarachnoid hemorrhage. 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 Primary coil embolization performed.  Performing Technologist: Ricka Holland RDMS, RVT  Examination Guidelines: A complete evaluation includes B-mode imaging, spectral Doppler, color Doppler, and power Doppler as needed of all accessible portions of each vessel. Bilateral testing is considered an integral part of a complete examination. Limited examinations for reoccurring indications may be performed as noted.  +----------+---------------+----------+-----------+-------------+ RIGHT TCD Right VM (cm/s)Depth (cm)Pulsatility   Comment    +----------+---------------+----------+-----------+-------------+ MCA             87                    0.96                  +----------+---------------+----------+-----------+-------------+ ACA            -101  5.70      0.75                  +----------+---------------+----------+-----------+-------------+ Term ICA        63                    0.99                  +----------+---------------+----------+-----------+-------------+ PCA P1          39                    0.91                  +----------+---------------+----------+-----------+-------------+ Opthalmic       20                    1.06                  +----------+---------------+----------+-----------+-------------+ ICA siphon                                    not insonated +----------+---------------+----------+-----------+-------------+ Vertebral       58                    0.78                  +----------+---------------+----------+-----------+-------------+  Distal ICA      39                                          +----------+---------------+----------+-----------+-------------+  +----------+--------------+----------+-----------+-------------+ LEFT TCD  Left VM (cm/s)Depth (cm)Pulsatility   Comment    +----------+--------------+----------+-----------+-------------+ MCA            107         4.60      0.92                  +----------+--------------+----------+-----------+-------------+ ACA            -59                   1.04                  +----------+--------------+----------+-----------+-------------+ Term ICA        63                   0.93                  +----------+--------------+----------+-----------+-------------+ PCA P1          22                   0.93                  +----------+--------------+----------+-----------+-------------+ Opthalmic       31                   0.99                  +----------+--------------+----------+-----------+-------------+ ICA siphon                                   not insonated +----------+--------------+----------+-----------+-------------+ Vertebral      -  45                   0.94                  +----------+--------------+----------+-----------+-------------+ Distal ICA      40                                         +----------+--------------+----------+-----------+-------------+  +------------+-------+-------+             VM cm/sComment +------------+-------+-------+ Prox Basilar  -73          +------------+-------+-------+ Dist Basilar  -80          +------------+-------+-------+ +----------------------+---+ Right Lindegaard Ratio2.2 +----------------------+---+ +---------------------+---+ Left Lindegaard Ratio2.6 +---------------------+---+  Summary:  Elevated mean flow velocities in left middle cerebral, right anterior cerebral and distal basilar arteries suggestive of mild vasospasm.Mildly elevated right middle cerebral artery  mean flow velocities of unclear significance *See table(s) above for TCD measurements and observations.  Diagnosing physician: Eather Popp MD Electronically signed by Eather Popp MD on 06/07/2023 at 12:03:05 PM.    Final    CT HEAD WO CONTRAST ( ) Result Date: 06/06/2023 CLINICAL DATA:  Subarachnoid hemorrhage [facility]).  Follow-up. EXAM: CT HEAD WITHOUT CONTRAST TECHNIQUE: Contiguous axial images were obtained from the base of the skull through the vertex without intravenous contrast. RADIATION DOSE REDUCTION: This exam was performed according to the departmental dose-optimization program which includes automated exposure control, adjustment of the mA and/or kV according to patient size and/or use of iterative reconstruction technique. COMPARISON:  CTA head/neck 06/03/2023. FINDINGS: Brain: Unchanged acute subarachnoid hemorrhage along the anterior falx with slight intraparenchymal extension into the left basal forebrain. No new hemorrhage or new loss of gray-white differentiation. No hydrocephalus, mass effect or midline shift. 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 Skull: No calvarial fracture or suspicious bone lesion. Skull base is unremarkable. Sinuses/Orbits: No acute finding. Other: None. IMPRESSION: 1. Unchanged acute subarachnoid hemorrhage along the anterior falx with slight intraparenchymal extension into the left basal forebrain. No new hemorrhage or new loss of gray-white differentiation. 2. 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 Electronically Signed   By: Ryan Chess M.D.   On: 06/06/2023 12:24    Labs:  CBC: Recent Labs    06/03/23 0148 06/04/23 0642 06/05/23 0637 06/06/23 0627  WBC 7.1 7.9 7.0 6.1  HGB 11.7* 10.7* 11.1* 12.9  HCT 36.4 33.4* 33.9* 38.9  PLT 387 379 318 333    COAGS: Recent Labs    06/03/23 1353  INR 1.1    BMP: Recent Labs    06/04/23 0642 06/05/23 0637 06/06/23 0627  06/07/23 0846  NA 138 135 134* 133*  K 3.6 3.5 3.5 3.7  CL 107 103 102 100  CO2 22 23 23 23   GLUCOSE 99 94 118* 123*  BUN 8 6 5* 8  CALCIUM 8.4* 8.7* 9.0 9.2  CREATININE 0.75 0.61 0.67 0.77  GFRNONAA >60 >60 >60 >60    LIVER FUNCTION TESTS: Recent Labs    04/03/23 1025 06/03/23 0148  BILITOT 0.6 0.4  AST 19 20  ALT 11 10  ALKPHOS 63 60  PROT 7.4 7.0  ALBUMIN 3.9 3.8    Assessment and Plan:  38 year old female with pertinent PMH polysubstance abuse (marijuana, cocaine), alcohol abuse, depression, PTSD, historical diagnoses of schizophrenia and bipolar (schizoaffective disorder, bipolar type) in  a patient with a history of labile moods, violence towards others, and medication non-compliance. She was brought to ED due to HA, CT head showed common aneurysm with small bilateral frontal SAH. She is s/p ACOM primary coil embolization by Dr. everitt Nile Erichsen on 06/03/23. Neurosurgery is following.    Patient reports improved headache today but ongoing left leg pain - primary team aware. No new symptoms/concern voiced.   Continue current plan of care, NIR will continue to follow along.  Electronically Signed: Clotilda DELENA Hesselbach, PA-C 06/09/2023, 10:17 AM   I spent a total of 15 Minutes at the the patient's bedside AND on the patient's hospital floor or unit, greater than 50% of which was counseling/coordinating care for [name].

## 2023-06-09 NOTE — Progress Notes (Signed)
  NEUROSURGERY PROGRESS NOTE   Pt seen and examined. No issues overnight. HA significantly improved this am.  EXAM: Temp:  [98.4 F (36.9 C)-99.3 F (37.4 C)] 98.4 F (36.9 C) (01/05 0401) Pulse Rate:  [55-82] 69 (01/05 0600) Resp:  [10-25] 22 (01/05 0700) BP: (98-128)/(61-99) 102/69 (01/05 0700) SpO2:  [94 %-100 %] 99 % (01/05 0600) Intake/Output      01/04 0701 01/05 0700 01/05 0701 01/06 0700   P.O. 480    Total Intake(mL/kg) 480 (8.2)    Urine (mL/kg/hr)     Stool     Total Output     Net +480         Urine Occurrence 2 x     Awake, alert, oriented Speech fluent CN intact 5/5 BUE/BLE  LABS: Lab Results  Component Value Date   CREATININE 0.77 06/07/2023   BUN 8 06/07/2023   NA 133 (L) 06/07/2023   K 3.7 06/07/2023   CL 100 06/07/2023   CO2 23 06/07/2023   Lab Results  Component Value Date   WBC 6.1 06/06/2023   HGB 12.9 06/06/2023   HCT 38.9 06/06/2023   MCV 84.2 06/06/2023   PLT 333 06/06/2023    TCD: Date POD PCO2 HCT BP   MCA ACA PCA OPHT SIPH VERT Basilar  1/2,rs         Right  Left   87  107   -101  -59   39  22   20  31    *  *   -58  -45   -73       1/3,rs         Right  Left   75  106   -99  -45   35  20   15  20    *  *   -35  -24   -57        IMPRESSION: - 38 y.o. female SAH d#6 s/p Acom coiling, neurologically intact. No clinical/sonographic spasm  PLAN: - Cont Nimotop  - Cont to mobilize as tolerataed    Gerldine Maizes, MD [facility]

## 2023-06-10 ENCOUNTER — Inpatient Hospital Stay (HOSPITAL_COMMUNITY): Payer: BLUE CROSS/BLUE SHIELD

## 2023-06-10 DIAGNOSIS — I609 Nontraumatic subarachnoid hemorrhage, unspecified: Secondary | ICD-10-CM

## 2023-06-10 LAB — BASIC METABOLIC PANEL
Anion gap: 9 (ref 5–15)
BUN: 16 mg/dL (ref 6–20)
CO2: 24 mmol/L (ref 22–32)
Calcium: 8.9 mg/dL (ref 8.9–10.3)
Chloride: 100 mmol/L (ref 98–111)
Creatinine, Ser: 0.81 mg/dL (ref 0.44–1.00)
GFR, Estimated: >60 mL/min (ref >60)
Glucose, Bld: 114 mg/dL — ABNORMAL HIGH (ref 70–99)
Potassium: 4.1 mmol/L (ref 3.5–5.1)
Sodium: 133 mmol/L — ABNORMAL LOW (ref 135–145)

## 2023-06-10 LAB — GLUCOSE, CAPILLARY
Glucose-Capillary: 104 mg/dL — ABNORMAL HIGH (ref 70–99)
Glucose-Capillary: 114 mg/dL — ABNORMAL HIGH (ref 70–99)
Glucose-Capillary: 116 mg/dL — ABNORMAL HIGH (ref 70–99)
Glucose-Capillary: 121 mg/dL — ABNORMAL HIGH (ref 70–99)
Glucose-Capillary: 127 mg/dL — ABNORMAL HIGH (ref 70–99)
Glucose-Capillary: 140 mg/dL — ABNORMAL HIGH (ref 70–99)
Glucose-Capillary: 147 mg/dL — ABNORMAL HIGH (ref 70–99)
Glucose-Capillary: 150 mg/dL — ABNORMAL HIGH (ref 70–99)
Glucose-Capillary: 97 mg/dL (ref 70–99)

## 2023-06-10 LAB — CBC WITH DIFFERENTIAL/PLATELET
Abs Immature Granulocytes: 0.02 10*3/uL (ref 0.00–0.07)
Basophils Absolute: 0 10*3/uL (ref 0.0–0.1)
Basophils Relative: 0 %
Eosinophils Absolute: 0 10*3/uL (ref 0.0–0.5)
Eosinophils Relative: 0 %
HCT: 33.1 % — ABNORMAL LOW (ref 36.0–46.0)
Hemoglobin: 10.8 g/dL — ABNORMAL LOW (ref 12.0–15.0)
Immature Granulocytes: 1 %
Lymphocytes Relative: 22 %
Lymphs Abs: 0.9 10*3/uL (ref 0.7–4.0)
MCH: 27.8 pg (ref 26.0–34.0)
MCHC: 32.6 g/dL (ref 30.0–36.0)
MCV: 85.1 fL (ref 80.0–100.0)
Monocytes Absolute: 0.3 10*3/uL (ref 0.1–1.0)
Monocytes Relative: 6 %
Neutro Abs: 3 10*3/uL (ref 1.7–7.7)
Neutrophils Relative %: 71 %
Platelets: 289 10*3/uL (ref 150–400)
RBC: 3.89 MIL/uL (ref 3.87–5.11)
RDW: 18.7 % — ABNORMAL HIGH (ref 11.5–15.5)
WBC: 4.3 10*3/uL (ref 4.0–10.5)
nRBC: 0 % (ref 0.0–0.2)

## 2023-06-10 LAB — HEMOGLOBIN A1C
Hgb A1c MFr Bld: 5.6 % (ref 4.8–5.6)
Mean Plasma Glucose: 114 mg/dL

## 2023-06-10 LAB — MAGNESIUM: Magnesium: 1.9 mg/dL (ref 1.7–2.4)

## 2023-06-10 LAB — PHOSPHORUS: Phosphorus: 4.3 mg/dL (ref 2.5–4.6)

## 2023-06-10 MED ORDER — INSULIN ASPART 100 UNIT/ML IJ SOLN
0.0000 [IU] | Freq: Three times a day (TID) | INTRAMUSCULAR | Status: DC
  Administered 2023-06-11 – 2023-06-12 (×3): 1 [IU] via SUBCUTANEOUS
  Administered 2023-06-13 (×2): 2 [IU] via SUBCUTANEOUS
  Administered 2023-06-13: 1 [IU] via SUBCUTANEOUS

## 2023-06-10 NOTE — Progress Notes (Signed)
 Referring Physician(s): Dr. JINNY Budge  Supervising Physician: Everitt Sarks Rodrigues, [place]  Patient Status:  [facility] - In-pt  Chief Complaint:  Follow up [facility] s/p primary coiling embolization in NIR 06/03/23   Subjective:  Patient seen at bedside with Dr. MARLA. everitt Sarks Erichsen. Patient endorses a headache she rates 3/4 out of 10. States it is from the turkey sausage. Patient states that she ambulated yesterday and is asking about discharge timing.   Allergies: Shellfish allergy, Pollen extract, and Aspirin  Medications: Prior to Admission medications   Medication Sig Start Date End Date Taking? Authorizing Provider  ibuprofen  (ADVIL ) 200 MG tablet Take 400 mg by mouth every 6 (six) hours as needed for headache or mild pain.    [provider]  ibuprofen  (ADVIL ) 600 MG tablet Take 1 tablet (600 mg total) by mouth every 6 (six) hours as needed. 04/03/23   Dean Clarity, MD  methocarbamol  (ROBAXIN ) 500 MG tablet Take 1 tablet (500 mg total) by mouth 2 (two) times daily as needed for muscle spasms. 04/03/23   Dean Clarity, MD  traZODone  (DESYREL ) 50 MG tablet Take 1 tablet (50 mg total) by mouth at bedtime. 09/13/22   Dasie Ellouise CROME, FNP     Vital Signs: BP 107/68   Pulse (!) 55   Temp 98.6 F (37 C) (Oral)   Resp (!) 26   Ht 5' 5.98 (1.676 m)   Wt 129 lb 3 oz (58.6 kg)   SpO2 100%   BMI 20.86 kg/m   Physical Exam Vitals and nursing note reviewed.  Constitutional:      Appearance: She is well-developed.  HENT:     Head: Normocephalic and atraumatic.  Eyes:     Conjunctiva/sclera: Conjunctivae normal.  Cardiovascular:     Rate and Rhythm: Normal rate and regular rhythm.  Pulmonary:     Effort: Pulmonary effort is normal.  Musculoskeletal:        General: Normal range of motion.     Cervical back: Normal range of motion.  Skin:    General: Skin is warm and dry.  Neurological:     Mental Status: She is alert and oriented to person, place, and time.  Mental status is at baseline.     Imaging: ECHOCARDIOGRAM COMPLETE Result Date: 06/07/2023    ECHOCARDIOGRAM REPORT   Patient Name:   YESLI VANDERHOFF Date of Exam: 06/07/2023 Medical Rec #:  [id number]        Height:       66.0 in Accession #:    [id number]       Weight:       129.9 lb Date of Birth:  [birth year]         BSA:          1.664 m Patient Age:    38 years         BP:           111/66 mmHg Patient Gender: F                HR:           66 bpm. Exam Location:  Inpatient Procedure: 2D Echo, Cardiac Doppler and Color Doppler Indications:    Stroke I63.9  History:        Patient has no prior history of Echocardiogram examinations.  Sonographer:    Tinnie Gosling RDCS Referring Phys: [id number] SUDHAM CHAND IMPRESSIONS  1. Left ventricular ejection fraction, by estimation, is 60 to 65%. The  left ventricle has normal function. The left ventricle has no regional wall motion abnormalities. Left ventricular diastolic parameters were normal.  2. Right ventricular systolic function is normal. The right ventricular size is mildly enlarged.  3. The mitral valve is normal in structure. No evidence of mitral valve regurgitation. No evidence of mitral stenosis.  4. The aortic valve is normal in structure. Aortic valve regurgitation is not visualized. No aortic stenosis is present.  5. The inferior vena cava is normal in size with greater than 50% respiratory variability, suggesting right atrial pressure of 3 mmHg. FINDINGS  Left Ventricle: Left ventricular ejection fraction, by estimation, is 60 to 65%. The left ventricle has normal function. The left ventricle has no regional wall motion abnormalities. The left ventricular internal cavity size was normal in size. There is  no left ventricular hypertrophy. Left ventricular diastolic parameters were normal. Right Ventricle: The right ventricular size is mildly enlarged. No increase in right ventricular wall thickness. Right ventricular systolic function is normal. Left  Atrium: Left atrial size was normal in size. Right Atrium: Right atrial size was normal in size. Pericardium: There is no evidence of pericardial effusion. Mitral Valve: The mitral valve is normal in structure. No evidence of mitral valve regurgitation. No evidence of mitral valve stenosis. Tricuspid Valve: The tricuspid valve is normal in structure. Tricuspid valve regurgitation is not demonstrated. No evidence of tricuspid stenosis. Aortic Valve: The aortic valve is normal in structure. There is mild aortic valve annular calcification. Aortic valve regurgitation is not visualized. No aortic stenosis is present. Pulmonic Valve: The pulmonic valve was normal in structure. Pulmonic valve regurgitation is not visualized. No evidence of pulmonic stenosis. Aorta: The aortic root is normal in size and structure. Venous: The inferior vena cava is normal in size with greater than 50% respiratory variability, suggesting right atrial pressure of 3 mmHg. IAS/Shunts: The interatrial septum was not well visualized.  LEFT VENTRICLE PLAX 2D LVIDd:         4.40 cm   Diastology LVIDs:         3.10 cm   LV e' medial:    11.30 cm/s LV PW:         0.60 cm   LV E/e' medial:  5.1 LV IVS:        0.60 cm   LV e' lateral:   12.90 cm/s LVOT diam:     1.90 cm   LV E/e' lateral: 4.5 LV SV:         51 LV SV Index:   31 LVOT Area:     2.84 cm  RIGHT VENTRICLE             IVC RV S prime:     11.10 cm/s  IVC diam: 0.70 cm TAPSE (M-mode): 1.6 cm LEFT ATRIUM           Index        RIGHT ATRIUM          Index LA diam:      2.10 cm 1.26 cm/m   RA Area:     9.64 cm LA Vol (A4C): 24.3 ml 14.60 ml/m  RA Volume:   18.00 ml 10.81 ml/m  AORTIC VALVE LVOT Vmax:   117.00 cm/s LVOT Vmean:  81.300 cm/s LVOT VTI:    0.180 m  AORTA Ao Root diam: 2.70 cm MITRAL VALVE MV Area (PHT): 1.99 cm    SHUNTS MV Decel Time: 382 msec    Systemic VTI:  0.18 m  MV E velocity: 57.50 cm/s  Systemic Diam: 1.90 cm MV A velocity: 46.70 cm/s MV E/A ratio:  1.23 Aditya  Sabharwal Electronically signed by Ria Commander Signature Date/Time: 06/07/2023/4:44:06 PM    Final    VAS US  TRANSCRANIAL DOPPLER Result Date: 06/07/2023  Transcranial Doppler Patient Name:  ROGELIO WINBUSH  Date of Exam:   06/07/2023 Medical Rec #: [id number]         Accession #:    [id number] Date of Birth: [birth year]          Patient Gender: F Patient Age:   9 years Exam Location:  [facility] Procedure:      VAS US  TRANSCRANIAL DOPPLER Referring Phys: CURTIS DE MACEDO RODRIGUES --------------------------------------------------------------------------------  Indications: Subarachnoid hemorrhage. 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 Primary coil embolization performed.  Performing Technologist: Ricka Holland RDMS, RVT  Examination Guidelines: A complete evaluation includes B-mode imaging, spectral Doppler, color Doppler, and power Doppler as needed of all accessible portions of each vessel. Bilateral testing is considered an integral part of a complete examination. Limited examinations for reoccurring indications may be performed as noted.  +----------+---------------+----------+-----------+-------------+ RIGHT TCD Right VM (cm/s)Depth (cm)Pulsatility   Comment    +----------+---------------+----------+-----------+-------------+ MCA             75                                          +----------+---------------+----------+-----------+-------------+ ACA             -99         6.30      0.65                  +----------+---------------+----------+-----------+-------------+ Term ICA        54                    1.00                  +----------+---------------+----------+-----------+-------------+ PCA P1          35                    0.55                  +----------+---------------+----------+-----------+-------------+ Opthalmic       15                    0.69                   +----------+---------------+----------+-----------+-------------+ ICA siphon                                    not insonated +----------+---------------+----------+-----------+-------------+ Vertebral       -35                   0.90                  +----------+---------------+----------+-----------+-------------+ Distal ICA      33                                          +----------+---------------+----------+-----------+-------------+  +----------+--------------+----------+-----------+-------------+ LEFT TCD  Left VM (  cm/s)Depth (cm)Pulsatility   Comment    +----------+--------------+----------+-----------+-------------+ MCA            106                   0.87                  +----------+--------------+----------+-----------+-------------+ ACA            -45                   0.93                  +----------+--------------+----------+-----------+-------------+ Term ICA        51                   0.85                  +----------+--------------+----------+-----------+-------------+ PCA P1          20                   0.91                  +----------+--------------+----------+-----------+-------------+ Opthalmic       20                   1.26                  +----------+--------------+----------+-----------+-------------+ ICA siphon                                   not insonated +----------+--------------+----------+-----------+-------------+ Vertebral      -24                   1.21                  +----------+--------------+----------+-----------+-------------+ Distal ICA      40                                         +----------+--------------+----------+-----------+-------------+  +------------+-------+-------+             VM cm/sComment +------------+-------+-------+ Prox Basilar  -40          +------------+-------+-------+ Dist Basilar  -57          +------------+-------+-------+ +----------------------+---+  Right Lindegaard Ratio2.2 +----------------------+---+ +---------------------+----+ Left Lindegaard Ratio2.65 +---------------------+----+    Preliminary    VAS US  TRANSCRANIAL DOPPLER Result Date: 06/07/2023  Transcranial Doppler Patient Name:  MAIKAYLA BEGGS  Date of Exam:   06/06/2023 Medical Rec #: [id number]         Accession #:    [id number] Date of Birth: 1986/03/26          Patient Gender: F Patient Age:   32 years Exam [facility] Procedure:      VAS US  TRANSCRANIAL DOPPLER Referring Phys: CURTIS DE MACEDO RODRIGUES --------------------------------------------------------------------------------  Indications: Subarachnoid hemorrhage. 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 Primary coil embolization performed.  Performing Technologist: Ricka Holland RDMS, RVT  Examination Guidelines: A complete evaluation includes B-mode imaging, spectral Doppler, color Doppler, and power Doppler as needed of all accessible portions of each vessel. Bilateral testing is considered an integral part of a complete examination. Limited  examinations for reoccurring indications may be performed as noted.  +----------+---------------+----------+-----------+-------------+ RIGHT TCD Right VM (cm/s)Depth (cm)Pulsatility   Comment    +----------+---------------+----------+-----------+-------------+ MCA             87                    0.96                  +----------+---------------+----------+-----------+-------------+ ACA            -101         5.70      0.75                  +----------+---------------+----------+-----------+-------------+ Term ICA        63                    0.99                  +----------+---------------+----------+-----------+-------------+ PCA P1          39                    0.91                  +----------+---------------+----------+-----------+-------------+  Opthalmic       20                    1.06                  +----------+---------------+----------+-----------+-------------+ ICA siphon                                    not insonated +----------+---------------+----------+-----------+-------------+ Vertebral       58                    0.78                  +----------+---------------+----------+-----------+-------------+ Distal ICA      39                                          +----------+---------------+----------+-----------+-------------+  +----------+--------------+----------+-----------+-------------+ LEFT TCD  Left VM (cm/s)Depth (cm)Pulsatility   Comment    +----------+--------------+----------+-----------+-------------+ MCA            107         4.60      0.92                  +----------+--------------+----------+-----------+-------------+ ACA            -59                   1.04                  +----------+--------------+----------+-----------+-------------+ Term ICA        63                   0.93                  +----------+--------------+----------+-----------+-------------+ PCA P1          22                   0.93                  +----------+--------------+----------+-----------+-------------+  Opthalmic       31                   0.99                  +----------+--------------+----------+-----------+-------------+ ICA siphon                                   not insonated +----------+--------------+----------+-----------+-------------+ Vertebral      -45                   0.94                  +----------+--------------+----------+-----------+-------------+ Distal ICA      40                                         +----------+--------------+----------+-----------+-------------+  +------------+-------+-------+             VM cm/sComment +------------+-------+-------+ Prox Basilar  -73          +------------+-------+-------+ Dist Basilar  -80           +------------+-------+-------+ +----------------------+---+ Right Lindegaard Ratio2.2 +----------------------+---+ +---------------------+---+ Left Lindegaard Ratio2.6 +---------------------+---+  Summary:  Elevated mean flow velocities in left middle cerebral, right anterior cerebral and distal basilar arteries suggestive of mild vasospasm.Mildly elevated right middle cerebral artery mean flow velocities of unclear significance *See table(s) above for TCD measurements and observations.  Diagnosing physician: Eather Popp MD Electronically signed by Eather Popp MD on 06/07/2023 at 12:03:05 PM.    Final     Labs:  CBC: Recent Labs    06/04/23 9357 06/05/23 9362 06/06/23 0627 06/10/23 0441  WBC 7.9 7.0 6.1 4.3  HGB 10.7* 11.1* 12.9 10.8*  HCT 33.4* 33.9* 38.9 33.1*  PLT 379 318 333 289    COAGS: Recent Labs    06/03/23 1353  INR 1.1    BMP: Recent Labs    06/05/23 0637 06/06/23 0627 06/07/23 0846 06/10/23 0441  NA 135 134* 133* 133*  K 3.5 3.5 3.7 4.1  CL 103 102 100 100  CO2 23 23 23 24   GLUCOSE 94 118* 123* 114*  BUN 6 5* 8 16  CALCIUM 8.7* 9.0 9.2 8.9  CREATININE 0.61 0.67 0.77 0.81  GFRNONAA >60 >60 >60 >60    LIVER FUNCTION TESTS: Recent Labs    04/03/23 1025 06/03/23 0148  BILITOT 0.6 0.4  AST 19 20  ALT 11 10  ALKPHOS 63 60  PROT 7.4 7.0  ALBUMIN 3.9 3.8    Assessment and Plan:  38 y.o. female inpatient. History of polysubstance abuse, schizophrenia, bipolar. Presented to the ED at [facility] on 12.30.24 with severe headache and AMS. Found to have a small bilateral frontal SAH. 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 No other aneurysm seen. S/p Primary coiling embolization performed with complete aneurysm occlusion. No thromboembolic or hemorrhagic complication with Dr. MARLA. de Nile Erichsen.    Patient seen at bedside with  Dr. MARLA. everitt Nile Erichsen. Patient alert, talking. Safety sitter at bedside.    NIR will continue to follow along - plans per Critical Care/ Neurology/Neurosurgery    Electronically Signed: Delon JAYSON Beagle, NP 06/10/2023, 11:29 AM  I spent a total of 15 Minutes at the patient's bedside AND on the patient's hospital floor or unit, greater than 50% of which was counseling/coordinating care for [facility] s/p coiling

## 2023-06-10 NOTE — Progress Notes (Signed)
 Physical Therapy Treatment Patient Details Name: Tiffany [name] MRN: [id number] DOB: [birth year] Today's Date: 06/10/2023   History of Present Illness Pt is a 38 y/o female presenting to [facility] ED on 12/30 with SAH.  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  PMHx:  marijuana addiction, alcohol abuse, cocaine addiction, [place], concussion, depression    PT Comments  A threshold may have been crossed.  Pt much improved from both a mobility and cognitive standpoint within the activities completed.  Emphasis on gait stability, cadence with challenge of balance incl scanning, abrupt change of direction, reading signs on left/right and using them to make decisions on where to go next.  Also initiated stair training with min and use of the rail.     If plan is discharge home, recommend the following:     Can travel by private vehicle        Equipment Recommendations       Recommendations for Other Services       Precautions / Restrictions Precautions Precautions: Fall     Mobility  Bed Mobility Overal bed mobility: Needs Assistance Bed Mobility: Supine to Sit, Sit to Supine     Supine to sit: Independent Sit to supine: Independent        Transfers Overall transfer level: Needs assistance   Transfers: Sit to/from Stand Sit to Stand: Contact guard assist   Step pivot transfers: Contact guard assist       General transfer comment: close guard to supervision    Ambulation/Gait Ambulation/Gait assistance: Min assist Gait Distance (Feet): 120 Feet (x3 with rest to regroup, talk about the positive changes) Assistive device: None Gait Pattern/deviations: Step-through pattern   Gait velocity interpretation: <1.8 ft/sec, indicate of risk for recurrent falls   General Gait Details: episodes of unsteadiness with normalizing gait, some drift, no stagger or scissoring.  Pt able to scan, read and problem solve what she  reads to do some way-finding with the cues.   Stairs Stairs: Yes Stairs assistance: Min assist   Number of Stairs: 10 General stair comments: safe TODAY with the rails, showed some control on the descent.   Wheelchair Mobility     Tilt Bed    Modified Rankin (Stroke Patients Only) Modified Rankin (Stroke Patients Only) Modified Rankin: Moderate disability     Balance Overall balance assessment: Needs assistance Sitting-balance support: No upper extremity supported, Feet supported Sitting balance-Leahy Scale: Fair     Standing balance support: No upper extremity supported, During functional activity Standing balance-Leahy Scale: Fair                              Cognition Arousal: Alert Behavior During Therapy: WFL for tasks assessed/performed, Flat affect, Impulsive (less flat, less impulsive) Overall Cognitive Status: Impaired/Different from baseline (much improved mentation, conversing, sustained focus, able to attend to activity, attend to cues in her environment.)                                          Exercises      General Comments General comments (skin integrity, edema, etc.): Bradycardic in the mid to upper 50's with activity, mid 90's sats.      Pertinent Vitals/Pain Pain Assessment Pain Assessment: 0-10 Pain Score: 6  Pain Location: head Pain Descriptors / Indicators:  Aching Pain Intervention(s): Monitored during session    Home Living                          Prior Function            PT Goals (current goals can now be found in the care plan section)      Frequency    Min 1X/week      PT Plan      Co-evaluation              AM-PAC PT 6 Clicks Mobility   Outcome Measure                   End of Session   Activity Tolerance: Patient tolerated treatment well Patient left: in bed;with call bell/phone within reach;with nursing/sitter in room Nurse Communication: Mobility  status PT Visit Diagnosis: Unsteadiness on feet (R26.81);Other symptoms and signs involving the nervous system (M70.101)     Time:  -     Charges:                            06/10/2023  [name], PT Acute Rehabilitation Services [phone]  (office)   [name] Mishaal Lansdale 06/10/2023, 1:02 PM
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
## 2023-06-10 NOTE — Progress Notes (Addendum)
   Providing Compassionate, Quality Care - Together   Subjective: Patient reports mild headache.  Objective: Vital signs in last 24 hours: Temp:  [97.9 F (36.6 C)-98.8 F (37.1 C)] 97.9 F (36.6 C) (01/06 1158) Pulse Rate:  [29-79] 29 (01/06 1200) Resp:  [11-26] 25 (01/06 1200) BP: (87-121)/(55-76) 121/70 (01/06 1200) SpO2:  [94 %-100 %] 95 % (01/06 1200)  Intake/Output from previous day: 01/05 0701 - 01/06 0700 In: 240 [P.O.:240] Out: -  Intake/Output this shift: Total I/O In: 360 [P.O.:360] Out: -   Alert and oriented x 4 PERRLA CN II-XII grossly intact Speech fluent MAE, Strength and sensation intact   Lab Results: Recent Labs    06/10/23 0441  WBC 4.3  HGB 10.8*  HCT 33.1*  PLT 289   BMET Recent Labs    06/10/23 0441  NA 133*  K 4.1  CL 100  CO2 24  GLUCOSE 114*  BUN 16  CREATININE 0.81  CALCIUM 8.9    Studies/Results: No results found.  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 Dr. everitt Nile Erichsen coiled the aneurysm on 06/03/2023.  I am in communication with my attending and they agree with the plan for this patient.    LOS: 7 days   -Continue Nimotop  -Continue to encourage mobilization   Gerard Beck, DNP, AGNP-C Nurse Practitioner  [facility] [address], [place], [place] [address] P: [phone]    F: [phone]  06/10/2023, 1:01 PM

## 2023-06-10 NOTE — Progress Notes (Signed)
 eLink Physician-Brief Progress Note Patient Name: Tiffany [name] DOB: 1985/08/08 MRN: [id number]   Date of Service  06/10/2023  HPI/Events of Note  Patient has had POCT CBG monitoring every 4 hours and AC/at bedtime in place.  eICU Interventions  Prefer AC/at bedtime.  Orders updated.     Intervention Category Intermediate Interventions: Hyperglycemia - evaluation and treatment  Yani Lal 06/10/2023, 8:38 PM

## 2023-06-10 NOTE — Progress Notes (Signed)
 Pt now endorsing that she is missing a pink wallet containing her license and other cards, clothes, and a wig. I have called pt's daughter Jaylin, sister Lennette whom was w/ pt on admission, and I also spoke w/ Garrel Louder [facility] security) about pt's belongings in addition to mentioning this to the RN charge. Neither contact knows anything about the pt's belongings. No documentation exist either.

## 2023-06-10 NOTE — Progress Notes (Signed)
 Transcranial Doppler  Date POD PCO2 HCT BP  MCA ACA PCA OPHT SIPH VERT Basilar  1/2,rs     Right  Left   87  107   -101  -59   39  22   20  31    *  *   -58  -45   -73      1/3,rs     Right  Left   75  106   -99  -45   35  20   15  20    *  *   -35  -24   -57      1/6,RS     Right  Left   103  110   -62  -62   49  67   18  19   *  54   -30  -33   -30            Right  Left                                             Right  Left                                            Right  Left                                            Right  Left                                        MCA = Middle Cerebral Artery      OPHT = Opthalmic Artery     BASILAR = Basilar Artery   ACA = Anterior Cerebral Artery     SIPH = Carotid Siphon PCA = Posterior Cerebral Artery   VERT = Verterbral Artery                   Normal MCA = 62+\-12 ACA = 50+\-12 PCA = 42+\-23  (*) unable to insonate Right Lindegaard Ratio  2.5, Left Lindegaard Ratio 3.1  06/10/2023  3:56 PM Tiffany [name], Tiffany [name]

## 2023-06-10 NOTE — Progress Notes (Signed)
 NAME:  Tiffany [name], MRN:  [id number], DOB:  1986-02-26, LOS: 7 ADMISSION DATE:  06/03/2023, CONSULTATION DATE:  06/03/2023 REFERRING MD:  Dr. Griselda, CHIEF COMPLAINT:  SAH w/ aneurysm   History of Present Illness:  Patient is a 38 year old female with pertinent PMH polysubstance abuse (marijuana, cocaine), alcohol abuse, depression, and HTN but not on any medications presents to [facility] ED on 12/30 with SAH.  On 12/30 patient complaining of severe headache and called EMS.  On their arrival patient combative and verbally abusive.  Multiple open alcohol bottles on scene.  She stated multiple times that she wanted to go home and kill herself.  Patient brought to [facility] ED.  On arrival patient remained agitated and required Haldol  and soft restraints.  Initial BP 167/102.  Patient protecting airway on room air.  Afebrile.  CT head showing common aneurysm with small bilateral frontal SAH.  Neuro consulted and recommended CTA head.  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 NSG consulted and will plan on coil embolization early this morning. Started on Cleviprex . PCCM to admit.  Pertinent  Medical History   Past Medical History:  Diagnosis Date   Addiction, marijuana (HCC)    Alcohol abuse    Chlamydia    Cocaine addiction (HCC)    Depression    History of concussion    Hypertension    does not take any meds     Significant Hospital Events: Including procedures, antibiotic start and stop dates in addition to other pertinent events   12/30 > cerebral angiogram and endovascular aneurysm embolization with aneurysmal occlusion and IR.  Returns to ICU intubated and sedated 12/31: Extubated  Interim History / Subjective:  Patient became agitated and aggressive yesterday evening Requiring multiple people for redirection She walked in the hallway without any problem Remain afebrile  Stated  headache is there but improved  Objective   Blood pressure 104/62, pulse (!) 58, temperature 98.6 F (37 C), temperature source Oral, resp. rate 18, height 5' 5.98 (1.676 m), weight 58.6 kg, SpO2 100%, unknown if currently breastfeeding.        Intake/Output Summary (Last 24 hours) at 06/10/2023 9093 Last data filed at 06/10/2023 0315 Gross per 24 hour  Intake 240 ml  Output --  Net 240 ml   Filed Weights   06/05/23 0500 06/06/23 0702 06/08/23 0417  Weight: 55.5 kg 58.9 kg 58.6 kg    Examination: General: African-American female, lying on the bed HEENT: /AT, eyes anicteric.  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 Continue neuro watch every hour Continue Tylenol , tramadol  and dexamethasone  4 mg twice daily for headache Neurosurgery and NIR is following TCD's today Continue nimodipine  Maintain euvolemia Encourage ambulation  Hyponatremia Serum sodium remained at 133  Polysubstance abuse Alcoholism No signs of withdrawal Counseling provided regarding quit substances Continue thiamine  and folate Continue as needed Ativan   Depression/homicidal ideation Appreciate psychiatry follow-up Thiamine /folate Continue Haldol  if needed Continue trazodone  IVC and suicide sitter until she can contract for safety Patient will need inpatient psychiatric admission  after medically cleared   Best Practice (right click and Reselect all SmartList Selections daily)   Diet/type: Regular consistency (see orders) DVT prophylaxis subcu Lovenox  Pressure ulcer(s): N/A GI prophylaxis:  N/A Lines: N/A Foley:  N/A Code Status:  full code Last date of multidisciplinary goals of care discussion [discussed status with the patient's sisters at bedside 12/31]  Labs   CBC: Recent Labs  Lab 06/04/23 0642 06/05/23 0637 06/06/23 0627 06/10/23 0441  WBC 7.9 7.0 6.1 4.3  NEUTROABS  --   --   --  3.0  HGB 10.7* 11.1* 12.9 10.8*  HCT 33.4* 33.9* 38.9 33.1*  MCV 86.8 86.0 84.2 85.1  PLT 379 318 333 289    Basic Metabolic Panel: Recent Labs  Lab 06/04/23 0642 06/05/23 0637 06/06/23 0627 06/07/23 0846 06/10/23 0441  NA 138 135 134* 133* 133*  K 3.6 3.5 3.5 3.7 4.1  CL 107 103 102 100 100  CO2 22 23 23 23 24   GLUCOSE 99 94 118* 123* 114*  BUN 8 6 5* 8 16  CREATININE 0.75 0.61 0.67 0.77 0.81  CALCIUM 8.4* 8.7* 9.0 9.2 8.9  MG 2.4  --   --  2.0 1.9  PHOS 4.1  --   --  3.8 4.3   GFR: Estimated Creatinine Clearance: 87.1 mL/min (by C-G formula based on SCr of 0.81 mg/dL). Recent Labs  Lab 06/04/23 [phone] 06/05/23 0637 06/06/23 0627 06/10/23 0441  WBC 7.9 7.0 6.1 4.3    Liver Function Tests: No results for input(s): AST, ALT, ALKPHOS, BILITOT, PROT, ALBUMIN in the last 168 hours.  No results for input(s): LIPASE, AMYLASE in the last 168 hours. No results for input(s): AMMONIA in the last 168 hours.  ABG    Component Value Date/Time   TCO2 27 06/08/2011 1842     Coagulation Profile: Recent Labs  Lab 06/03/23 1353  INR 1.1    Cardiac Enzymes: No results for input(s): CKTOTAL, CKMB, CKMBINDEX, TROPONINI in the last 168 hours.  HbA1C: No results found for: HGBA1C  CBG: Recent Labs  Lab 06/09/23 0853 06/09/23 1201 06/09/23 1607 06/10/23 0000 06/10/23 0720  GLUCAP 97 120* 122* 104* 127*      Valinda Novas, MD [facility] See Amion for pager If no response to pager, please call [phone] until 7pm After 7pm, Please call E-link [phone]

## 2023-06-10 NOTE — Progress Notes (Addendum)
 Pt states chronic nerve pain in legs. Dr. Merrily Pew informed, PT stated to be the intervention to help w/ such complaint.

## 2023-06-10 NOTE — Plan of Care (Signed)
  Problem: Coping: Goal: Level of anxiety will decrease Outcome: Progressing   Problem: Pain Management: Goal: General experience of comfort will improve Outcome: Progressing   Problem: Safety: Goal: Ability to remain free from injury will improve Outcome: Progressing   Problem: Activity: Goal: Ability to return to baseline activity level will improve Outcome: Progressing

## 2023-06-10 NOTE — Progress Notes (Signed)
 Patient belonging bag found in cabinet outside of patient's room with a patient sticker on it.   Belongings shown to patient and reviewed. Belongings in bag include: black jacket, red sweat pants, a top, bra, shoes, one bracelet and one earring, a pink wallet with $2.00 cash, 9 credit/debit cards, driver's license, small metal cross, and small pocket knife inside it.   Belongings form signed by patient and belongings taken to security to secure in safe.   Yellow receipt slip to receive belongings placed in drawer on clipboard outside patient's room on 4[address] ICU, room 17.

## 2023-06-10 NOTE — TOC Progression Note (Addendum)
 Transition of Care [facility]) - Progression Note    Patient Details  Name: Tiffany [name] MRN: [id number] Date of Birth: [birth year]  Transition of Care [facility] CM/SW Contact  Inocente GORMAN Kindle, LCSW Phone Number: 06/10/2023, 12:32 PM  Clinical Narrative:    12:33 PM-CSW renewed IVC paperwork, Envelope [id number], and contacted magistrate. New Case # [id number]. Will contact [employer] to serve. IVC Paperwork placed on hard chart.         Expected Discharge Plan and Services                                               Social Determinants of Health (SDOH) Interventions SDOH Screenings   Food Insecurity: Food Insecurity Present (06/05/2023)  Housing: High Risk (06/05/2023)  Transportation Needs: Unmet Transportation Needs (06/05/2023)  Utilities: At Risk (06/05/2023)  Social Connections: Unknown (10/17/2021)   Received from [facility], [facility]  Tobacco Use: High Risk (04/03/2023)    Readmission Risk Interventions     No data to display

## 2023-06-11 ENCOUNTER — Other Ambulatory Visit (HOSPITAL_COMMUNITY): Payer: BLUE CROSS/BLUE SHIELD

## 2023-06-11 LAB — BASIC METABOLIC PANEL
Anion gap: 8 (ref 5–15)
BUN: 10 mg/dL (ref 6–20)
CO2: 25 mmol/L (ref 22–32)
Calcium: 8.9 mg/dL (ref 8.9–10.3)
Chloride: 102 mmol/L (ref 98–111)
Creatinine, Ser: 0.63 mg/dL (ref 0.44–1.00)
GFR, Estimated: >60 mL/min (ref >60)
Glucose, Bld: 136 mg/dL — ABNORMAL HIGH (ref 70–99)
Potassium: 4 mmol/L (ref 3.5–5.1)
Sodium: 135 mmol/L (ref 135–145)

## 2023-06-11 LAB — CBC WITH DIFFERENTIAL/PLATELET
Abs Immature Granulocytes: 0.01 10*3/uL (ref 0.00–0.07)
Basophils Absolute: 0 10*3/uL (ref 0.0–0.1)
Basophils Relative: 0 %
Eosinophils Absolute: 0 10*3/uL (ref 0.0–0.5)
Eosinophils Relative: 0 %
HCT: 32.9 % — ABNORMAL LOW (ref 36.0–46.0)
Hemoglobin: 10.7 g/dL — ABNORMAL LOW (ref 12.0–15.0)
Immature Granulocytes: 0 %
Lymphocytes Relative: 25 %
Lymphs Abs: 1.1 10*3/uL (ref 0.7–4.0)
MCH: 28 pg (ref 26.0–34.0)
MCHC: 32.5 g/dL (ref 30.0–36.0)
MCV: 86.1 fL (ref 80.0–100.0)
Monocytes Absolute: 0.3 10*3/uL (ref 0.1–1.0)
Monocytes Relative: 7 %
Neutro Abs: 3 10*3/uL (ref 1.7–7.7)
Neutrophils Relative %: 68 %
Platelets: 313 10*3/uL (ref 150–400)
RBC: 3.82 MIL/uL — ABNORMAL LOW (ref 3.87–5.11)
RDW: 18.8 % — ABNORMAL HIGH (ref 11.5–15.5)
WBC: 4.5 10*3/uL (ref 4.0–10.5)
nRBC: 0 % (ref 0.0–0.2)

## 2023-06-11 LAB — GLUCOSE, CAPILLARY
Glucose-Capillary: 148 mg/dL — ABNORMAL HIGH (ref 70–99)
Glucose-Capillary: 91 mg/dL (ref 70–99)
Glucose-Capillary: 128 mg/dL — ABNORMAL HIGH (ref 70–99)
Glucose-Capillary: 106 mg/dL — ABNORMAL HIGH (ref 70–99)

## 2023-06-11 MED ORDER — DEXAMETHASONE 2 MG PO TABS: 2.0000 mg | ORAL_TABLET | Freq: Every day | ORAL | Status: DC

## 2023-06-11 MED ORDER — DEXAMETHASONE 2 MG PO TABS
2.0000 mg | ORAL_TABLET | Freq: Two times a day (BID) | ORAL | Status: DC
  Administered 2023-06-11 – 2023-06-13 (×5): 2 mg via ORAL
  Filled 2023-06-11 (×5): qty 1

## 2023-06-11 NOTE — Progress Notes (Signed)
 Subjective: The patient is alert and pleasant.  She looks well.  Objective: Vital signs in last 24 hours: Temp:  [97.9 F (36.6 C)-99.4 F (37.4 C)] 98.6 F (37 C) (01/07 0334) Pulse Rate:  [29-74] 68 (01/07 0717) Resp:  [11-26] 18 (01/07 0717) BP: (93-121)/(54-90) 107/57 (01/07 0717) SpO2:  [95 %-100 %] 96 % (01/07 0717) Weight:  [56.6 kg] 56.6 kg (01/07 0450) Estimated body mass index is 20.15 kg/m as calculated from the following:   Height as of this encounter: 5' 5.98 (1.676 m).   Weight as of this encounter: 56.6 kg.   Intake/Output from previous day: 01/06 0701 - 01/07 0700 In: 780 [P.O.:780] Out: 0  Intake/Output this shift: No intake/output data recorded.  Physical exam the patient is alert and oriented.  Her strength and speech is normal.  Lab Results: Recent Labs    06/10/23 0441 06/11/23 0555  WBC 4.3 4.5  HGB 10.8* 10.7*  HCT 33.1* 32.9*  PLT 289 313   BMET Recent Labs    06/10/23 0441 06/11/23 0555  NA 133* 135  K 4.1 4.0  CL 100 102  CO2 24 25  GLUCOSE 114* 136*  BUN 16 10  CREATININE 0.81 0.63  CALCIUM 8.9 8.9    Studies/Results: VAS US  TRANSCRANIAL DOPPLER Result Date: 06/10/2023  Transcranial Doppler Patient Name:  Tiffany [name]  Date of Exam:   06/10/2023 Medical Rec #: [id number]         Accession #:    [id number] Date of Birth: January 04, 1986          Patient Gender: F Patient Age:   78 years Exam Location:  [facility] Procedure:      VAS US  TRANSCRANIAL DOPPLER Referring Phys: CURTIS DE MACEDO RODRIGUES --------------------------------------------------------------------------------  Indications: Subarachnoid hemorrhage. Performing Technologist: Ricka Holland RDMS, RVT  Examination Guidelines: A complete evaluation includes B-mode imaging, spectral Doppler, color Doppler, and power Doppler as needed of all accessible portions of each vessel. Bilateral testing is considered an integral part of a complete examination. Limited  examinations for reoccurring indications may be performed as noted.  +----------+---------------+----------+-----------+-------------+ RIGHT TCD Right VM (cm/s)Depth (cm)Pulsatility   Comment    +----------+---------------+----------+-----------+-------------+ MCA             103                   1.05                  +----------+---------------+----------+-----------+-------------+ ACA             -62                   0.82                  +----------+---------------+----------+-----------+-------------+ Term ICA        93                    0.93                  +----------+---------------+----------+-----------+-------------+ PCA P1          49                    0.95                  +----------+---------------+----------+-----------+-------------+ Opthalmic       18  0.95                  +----------+---------------+----------+-----------+-------------+ ICA siphon                                    NOT INSONATED +----------+---------------+----------+-----------+-------------+ Vertebral       -30                   1.11                  +----------+---------------+----------+-----------+-------------+ Distal ICA      41                                          +----------+---------------+----------+-----------+-------------+  +----------+--------------+----------+-----------+-------+ LEFT TCD  Left VM (cm/s)Depth (cm)PulsatilityComment +----------+--------------+----------+-----------+-------+ MCA            110                   1.07            +----------+--------------+----------+-----------+-------+ ACA            -62                   1.10            +----------+--------------+----------+-----------+-------+ Term ICA        67                   0.78            +----------+--------------+----------+-----------+-------+ PCA P1          16                   1.07             +----------+--------------+----------+-----------+-------+ Opthalmic       19                   1.43            +----------+--------------+----------+-----------+-------+ ICA siphon      54                   0.69            +----------+--------------+----------+-----------+-------+ Vertebral      -33                   1.01            +----------+--------------+----------+-----------+-------+ Distal ICA      35                                   +----------+--------------+----------+-----------+-------+  +------------+-------+-------+             VM cm/sComment +------------+-------+-------+ Dist Basilar  -30          +------------+-------+-------+ +----------------------+---+ Right Lindegaard Ratio2.5 +----------------------+---+ +---------------------+---+ Left Lindegaard Ratio3.1 +---------------------+---+    Preliminary     Assessment/Plan: Hospital day #8: The patient is doing well status post her aneurysmal subarachnoid hemorrhage and coiling.  She looks like she is near ready for discharge.  I will sign off.  Please have her follow-up with Dr. Lanis in the office in a few weeks.  Please call if I can be of  further assistance.  LOS: 8 days     Tiffany [name] 06/11/2023, 7:52 AM     Patient ID: Tiffany [name], female   DOB: 05/23/1986, 39 y.o.   MRN: [id number]

## 2023-06-11 NOTE — Progress Notes (Signed)
 Upon initial assessment patient was found to have a minor headache. PRN tylenol  given as needed throughout shift, patient expressed relief. X1 BM, voiding spontaneously. Patient and family updated in plan of care. 1:1 sitter at bedside. Patient transferred to [address], [place] given to Nichola, CHARITY FUNDRAISER.

## 2023-06-11 NOTE — Progress Notes (Signed)
 Physical Therapy Treatment Patient Details Name: Tiffany [name] MRN: [id number] DOB: September 16, 1985 Today's Date: 06/11/2023   History of Present Illness Pt is a 38 y/o female presenting to [facility] ED on 12/30 with SAH.  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  PMHx:  marijuana addiction, alcohol abuse, cocaine addiction, TN, concussion, depression    PT Comments  Patient progressing nicely with ambulation and awareness.  Still some difficulty with multistep directions and needing cues for safety waiting for PT to manage telemetry lines prior to climbing stairs and for PT to stabilize rolling chair while performing stretches.  She was appreciative of stretches and they seemed to relieve some of the L hip discomfort.  PT will continue to follow while in the acute setting.     If plan is discharge home, recommend the following: A little help with walking and/or transfers;A little help with bathing/dressing/bathroom;Direct supervision/assist for medications management;Direct supervision/assist for financial management   Can travel by private vehicle     Yes  Equipment Recommendations  None recommended by PT    Recommendations for Other Services       Precautions / Restrictions Precautions Precautions: Fall     Mobility  Bed Mobility           Sit to supine: Independent        Transfers Overall transfer level: Needs assistance   Transfers: Sit to/from Stand Sit to Stand: Supervision           General transfer comment: to sit with cues for safety with lines    Ambulation/Gait Ambulation/Gait assistance: Supervision, Contact guard assist Gait Distance (Feet): 400 Feet Assistive device: Rolling walker (2 wheels), None Gait Pattern/deviations: Step-through pattern, Decreased stride length, Wide base of support       General Gait Details: already in hallway with nursing with RW, kept RW for ambulation but  used rail in hallway for balance techniques, in room no device; assist for balance with L hip trendelenberg and for balance/safety with lines; reading signs L to R without LOB though using RW   Stairs Stairs: Yes Stairs assistance: Contact guard assist Stair Management: One rail Right, Alternating pattern, Forwards Number of Stairs: 10 General stair comments: assist for balance and cues for safety with lines (heart monitor)   Wheelchair Mobility     Tilt Bed    Modified Rankin (Stroke Patients Only)       Balance Overall balance assessment: Needs assistance Sitting-balance support: Feet supported Sitting balance-Leahy Scale: Good     Standing balance support: No upper extremity supported Standing balance-Leahy Scale: Fair                 High Level Balance Comments: balance activities at rail, side stepping, heel raises and marching            Cognition Arousal: Alert Behavior During Therapy: WFL for tasks assessed/performed, Flat affect, Impulsive Overall Cognitive Status: Impaired/Different from baseline                     Current Attention Level: Sustained Memory: Decreased short-term memory Following Commands: Follows one step commands consistently, Follows multi-step commands consistently, Follows multi-step commands with increased time     Problem Solving: Slow processing, Difficulty sequencing General Comments: Aware she is leaving ICU today, remembered other therapist, but could not produce name; following multistep commands with multimodal cues and time        Exercises Other Exercises  Other Exercises: seated stretches in hallway for L hamstrings, piriformis and standing to hip extensors with L foot on second or third step holding rail Other Exercises: supine stretches single knee to chest and figure 4 piriformis    General Comments General comments (skin integrity, edema, etc.): HR, SpO2 stable with ambulation and stairs today       Pertinent Vitals/Pain Pain Assessment Pain Assessment: Faces Faces Pain Scale: Hurts little more Pain Location: L hip Pain Descriptors / Indicators: Aching, Discomfort Pain Intervention(s): Monitored during session, Other (comment), Utilized relaxation techniques (stretching)    Home Living                          Prior Function            PT Goals (current goals can now be found in the care plan section) Progress towards PT goals: Progressing toward goals    Frequency           PT Plan      Co-evaluation              AM-PAC PT 6 Clicks Mobility   Outcome Measure  Help needed turning from your back to your side while in a flat bed without using bedrails?: A Little Help needed moving from lying on your back to sitting on the side of a flat bed without using bedrails?: A Little Help needed moving to and from a bed to a chair (including a wheelchair)?: A Little Help needed standing up from a chair using your arms (e.g., wheelchair or bedside chair)?: A Little Help needed to walk in hospital room?: A Little Help needed climbing 3-5 steps with a railing? : A Little 6 Click Score: 18    End of Session Equipment Utilized During Treatment: Gait belt Activity Tolerance: Patient tolerated treatment well Patient left: in bed;with call bell/phone within reach;with nursing/sitter in room;Other (comment) (meal tray set up)   PT Visit Diagnosis: Other abnormalities of gait and mobility (R26.89);Other symptoms and signs involving the nervous system (R29.898)     Time: 8799-8782 PT Time Calculation (min) (ACUTE ONLY): 17 min  Charges:    $Gait Training: 8-22 mins PT General Charges $$ ACUTE PT VISIT: 1 Visit                     Micheline Portal, PT Acute Rehabilitation Services Office:[phone] 06/11/2023    Montie Portal 06/11/2023, 12:25 PM

## 2023-06-11 NOTE — Progress Notes (Signed)
 NAME:  Tiffany [name], MRN:  [id number], DOB:  [birth year], LOS: 8 ADMISSION DATE:  06/03/2023, CONSULTATION DATE:  06/03/2023 REFERRING MD:  Dr. Griselda, CHIEF COMPLAINT:  SAH w/ aneurysm   History of Present Illness:  Patient is a 38 year old female with pertinent PMH polysubstance abuse (marijuana, cocaine), alcohol abuse, depression, and HTN but not on any medications presents to [facility] ED on 12/30 with SAH.  On 12/30 patient complaining of severe headache and called EMS.  On their arrival patient combative and verbally abusive.  Multiple open alcohol bottles on scene.  She stated multiple times that she wanted to go home and kill herself.  Patient brought to [facility] ED.  On arrival patient remained agitated and required Haldol  and soft restraints.  Initial BP 167/102.  Patient protecting airway on room air.  Afebrile.  CT head showing common aneurysm with small bilateral frontal SAH.  Neuro consulted and recommended CTA head.  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 NSG consulted and will plan on coil embolization early this morning. Started on Cleviprex . PCCM to admit.  Pertinent  Medical History   Past Medical History:  Diagnosis Date   Addiction, marijuana (HCC)    Alcohol abuse    Chlamydia    Cocaine addiction (HCC)    Depression    History of concussion    Hypertension    does not take any meds     Significant Hospital Events: Including procedures, antibiotic start and stop dates in addition to other pertinent events   12/30 > cerebral angiogram and endovascular aneurysm embolization with aneurysmal occlusion and IR.  Returns to ICU intubated and sedated 12/31: Extubated  Interim History / Subjective:  No overnight issues Remained clinically stable  Objective   Blood pressure 112/73, pulse 60, temperature 98.7 F (37.1 C), temperature source Oral, resp. rate 17,  height 5' 5.98 (1.676 m), weight 56.6 kg, SpO2 100%, unknown if currently breastfeeding.        Intake/Output Summary (Last 24 hours) at 06/11/2023 0845 Last data filed at 06/11/2023 0810 Gross per 24 hour  Intake 1260 ml  Output 0 ml  Net 1260 ml   Filed Weights   06/06/23 0702 06/08/23 0417 06/11/23 0450  Weight: 58.9 kg 58.6 kg 56.6 kg    Examination: General: Young African-American female female, lying on the bed HEENT: [name]/AT, eyes anicteric.  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 She is neurologically stable Continue Tylenol , tramadol  and dec dexamethasone  to 2mg  twice daily for headache Neurosurgery has signed off TCD's remained stable Continue nimodipine  to complete 21 days therapy Maintain euvolemia Encourage ambulation  Hyponatremia, resolved Serum sodium improved to 135  Polysubstance abuse Alcoholism No signs of withdrawal Counseling provided regarding quit substances Continue thiamine  and folate Continue as needed Ativan   Depression/homicidal ideation Appreciate psychiatry follow-up Thiamine /folate Continue Haldol  if needed Continue trazodone  IVC and suicide sitter until she can contract for safety Patient is medically cleared to be discharged to inpatient psychiatric unit  Best Practice (right click and Reselect all Pension Scheme Manager  daily)   Diet/type: Regular consistency (see orders) DVT prophylaxis subcu Lovenox  Pressure ulcer(s): N/A GI prophylaxis: N/A Lines: N/A Foley:  N/A Code Status:  full code Last date of multidisciplinary goals  of care discussion [discussed status with the patient's sisters at bedside 12/31]  Labs   CBC: Recent Labs  Lab 06/05/23 0637 06/06/23 0627 06/10/23 0441 06/11/23 0555  WBC 7.0 6.1 4.3 4.5  NEUTROABS  --   --  3.0 3.0  HGB 11.1* 12.9 10.8* 10.7*  HCT 33.9* 38.9 33.1* 32.9*  MCV 86.0 84.2 85.1 86.1  PLT 318 333 289 313    Basic Metabolic Panel: Recent Labs  Lab 06/05/23 0637 06/06/23 0627 06/07/23 0846 06/10/23 0441 06/11/23 0555  NA 135 134* 133* 133* 135  K 3.5 3.5 3.7 4.1 4.0  CL 103 102 100 100 102  CO2 23 23 23 24 25   GLUCOSE 94 118* 123* 114* 136*  BUN 6 5* 8 16 10   CREATININE 0.61 0.67 0.77 0.81 0.63  CALCIUM 8.7* 9.0 9.2 8.9 8.9  MG  --   --  2.0 1.9  --   PHOS  --   --  3.8 4.3  --    GFR: Estimated Creatinine Clearance: 85.2 mL/min (by C-G formula based on SCr of 0.63 mg/dL). Recent Labs  Lab 06/05/23 [phone] 06/06/23 0627 06/10/23 0441 06/11/23 0555  WBC 7.0 6.1 4.3 4.5    Liver Function Tests: No results for input(s): AST, ALT, ALKPHOS, BILITOT, PROT, ALBUMIN in the last 168 hours.  No results for input(s): LIPASE, AMYLASE in the last 168 hours. No results for input(s): AMMONIA in the last 168 hours.  ABG    Component Value Date/Time   TCO2 27 06/08/2011 1842     Coagulation Profile: No results for input(s): INR, PROTIME in the last 168 hours.   Cardiac Enzymes: No results for input(s): CKTOTAL, CKMB, CKMBINDEX, TROPONINI in the last 168 hours.  HbA1C: Hgb A1c MFr Bld  Date/Time Value Ref Range Status  06/08/2023 09:03 AM 5.6 4.8 - 5.6 % Final    Comment:    (NOTE)         Prediabetes: 5.7 - 6.4         Diabetes: >6.4         Glycemic control for adults with diabetes: <7.0     CBG: Recent Labs  Lab 06/10/23 0720 06/10/23 1124 06/10/23 1700 06/10/23 2115 06/11/23 0819  GLUCAP 127* 97 150* 116* 148*      Valinda Novas, MD [facility] See Amion for pager If no response  to pager, please call [phone] until 7pm After 7pm, Please call E-link [phone]

## 2023-06-11 NOTE — Plan of Care (Signed)
   Problem: Education: Goal: Knowledge of General Education information will improve Description Including pain rating scale, medication(s)/side effects and non-pharmacologic comfort measures Outcome: Progressing

## 2023-06-11 NOTE — Progress Notes (Addendum)
 Referring Physician(s): Dr. JINNY Budge  Supervising Physician: Everitt Sarks Rodrigues, [place]  Patient Status:  [name] - In-pt  Chief Complaint:  Follow up [facility] s/p primary coiling embolization in NIR 06/03/23   Subjective:  Patient seen at bedside with Dr. MARLA. everitt Sarks Erichsen.  She is laying in bed NDA, sitter at bed side. RN reports no issues.  Patient states that her HA is better than yesterday, able to eat. Still has some issues with her leg, able to work with PT and denies any weakness.    Allergies: Shellfish allergy, Pollen extract, and Aspirin  Medications: Prior to Admission medications   Medication Sig Start Date End Date Taking? Authorizing Provider  ibuprofen  (ADVIL ) 200 MG tablet Take 400 mg by mouth every 6 (six) hours as needed for headache or mild pain.    [provider]  ibuprofen  (ADVIL ) 600 MG tablet Take 1 tablet (600 mg total) by mouth every 6 (six) hours as needed. 04/03/23   Dean Clarity, MD  methocarbamol  (ROBAXIN ) 500 MG tablet Take 1 tablet (500 mg total) by mouth 2 (two) times daily as needed for muscle spasms. 04/03/23   Dean Clarity, MD  traZODone  (DESYREL ) 50 MG tablet Take 1 tablet (50 mg total) by mouth at bedtime. 09/13/22   Dasie Ellouise CROME, FNP     Vital Signs: BP 118/68   Pulse 70   Temp 98.7 F (37.1 C) (Oral)   Resp (!) 23   Ht 5' 5.98 (1.676 m)   Wt 124 lb 12.5 oz (56.6 kg)   SpO2 98%   BMI 20.15 kg/m   Physical Exam Vitals and nursing note reviewed.  Constitutional:      Appearance: She is well-developed.  HENT:     Head: Normocephalic and atraumatic.  Cardiovascular:     Rate and Rhythm: Normal rate and regular rhythm.  Pulmonary:     Effort: Pulmonary effort is normal.  Musculoskeletal:        General: Normal range of motion.     Cervical back: Normal range of motion and neck supple.  Skin:    General: Skin is warm and dry.  Neurological:     Mental Status: She is alert. Mental status is at baseline.   Psychiatric:        Mood and Affect: Mood normal.        Behavior: Behavior normal.     Imaging: VAS US  TRANSCRANIAL DOPPLER Result Date: 06/10/2023  Transcranial Doppler Patient Name:  Tiffany [name]  Date of Exam:   06/10/2023 Medical Rec #: [id number]         Accession #:    [id number] Date of Birth: 10-Dec-1985          Patient Gender: F Patient Age:   38 years Exam Location:  [facility] Procedure:      VAS US  TRANSCRANIAL DOPPLER Referring Phys: CURTIS DE MACEDO RODRIGUES --------------------------------------------------------------------------------  Indications: Subarachnoid hemorrhage. Performing Technologist: Ricka Holland RDMS, RVT  Examination Guidelines: A complete evaluation includes B-mode imaging, spectral Doppler, color Doppler, and power Doppler as needed of all accessible portions of each vessel. Bilateral testing is considered an integral part of a complete examination. Limited examinations for reoccurring indications may be performed as noted.  +----------+---------------+----------+-----------+-------------+ RIGHT TCD Right VM (cm/s)Depth (cm)Pulsatility   Comment    +----------+---------------+----------+-----------+-------------+ MCA             103  1.05                  +----------+---------------+----------+-----------+-------------+ ACA             -62                   0.82                  +----------+---------------+----------+-----------+-------------+ Term ICA        93                    0.93                  +----------+---------------+----------+-----------+-------------+ PCA P1          49                    0.95                  +----------+---------------+----------+-----------+-------------+ Opthalmic       18                    0.95                  +----------+---------------+----------+-----------+-------------+ ICA siphon                                    NOT INSONATED  +----------+---------------+----------+-----------+-------------+ Vertebral       -30                   1.11                  +----------+---------------+----------+-----------+-------------+ Distal ICA      41                                          +----------+---------------+----------+-----------+-------------+  +----------+--------------+----------+-----------+-------+ LEFT TCD  Left VM (cm/s)Depth (cm)PulsatilityComment +----------+--------------+----------+-----------+-------+ MCA            110                   1.07            +----------+--------------+----------+-----------+-------+ ACA            -62                   1.10            +----------+--------------+----------+-----------+-------+ Term ICA        67                   0.78            +----------+--------------+----------+-----------+-------+ PCA P1          16                   1.07            +----------+--------------+----------+-----------+-------+ Opthalmic       19                   1.43            +----------+--------------+----------+-----------+-------+ ICA siphon      54                   0.69            +----------+--------------+----------+-----------+-------+  Vertebral      -33                   1.01            +----------+--------------+----------+-----------+-------+ Distal ICA      35                                   +----------+--------------+----------+-----------+-------+  +------------+-------+-------+             VM cm/sComment +------------+-------+-------+ Dist Basilar  -30          +------------+-------+-------+ +----------------------+---+ Right Lindegaard Ratio2.5 +----------------------+---+ +---------------------+---+ Left Lindegaard Ratio3.1 +---------------------+---+    Preliminary    VAS US  TRANSCRANIAL DOPPLER Result Date: 06/07/2023  Transcranial Doppler Patient Name:  Tiffany [name]  Date of Exam:   06/07/2023 Medical Rec #:  [id number]         Accession #:    [id number] Date of Birth: 05-Nov-1985          Patient Gender: F Patient Age:   38 years Exam [facility]:  [facility] Procedure:      VAS US  TRANSCRANIAL DOPPLER Referring Phys: CURTIS DE MACEDO RODRIGUES --------------------------------------------------------------------------------  Indications: Subarachnoid hemorrhage. 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 Primary coil embolization performed.  Performing Technologist: Ricka Holland RDMS, RVT  Examination Guidelines: A complete evaluation includes B-mode imaging, spectral Doppler, color Doppler, and power Doppler as needed of all accessible portions of each vessel. Bilateral testing is considered an integral part of a complete examination. Limited examinations for reoccurring indications may be performed as noted.  +----------+---------------+----------+-----------+-------------+ RIGHT TCD Right VM (cm/s)Depth (cm)Pulsatility   Comment    +----------+---------------+----------+-----------+-------------+ MCA             75                                          +----------+---------------+----------+-----------+-------------+ ACA             -99         6.30      0.65                  +----------+---------------+----------+-----------+-------------+ Term ICA        54                    1.00                  +----------+---------------+----------+-----------+-------------+ PCA P1          35                    0.55                  +----------+---------------+----------+-----------+-------------+ Opthalmic       15                    0.69                  +----------+---------------+----------+-----------+-------------+ ICA siphon                                    not insonated +----------+---------------+----------+-----------+-------------+ Vertebral       -35  0.90                   +----------+---------------+----------+-----------+-------------+ Distal ICA      33                                          +----------+---------------+----------+-----------+-------------+  +----------+--------------+----------+-----------+-------------+ LEFT TCD  Left VM (cm/s)Depth (cm)Pulsatility   Comment    +----------+--------------+----------+-----------+-------------+ MCA            106                   0.87                  +----------+--------------+----------+-----------+-------------+ ACA            -45                   0.93                  +----------+--------------+----------+-----------+-------------+ Term ICA        51                   0.85                  +----------+--------------+----------+-----------+-------------+ PCA P1          20                   0.91                  +----------+--------------+----------+-----------+-------------+ Opthalmic       20                   1.26                  +----------+--------------+----------+-----------+-------------+ ICA siphon                                   not insonated +----------+--------------+----------+-----------+-------------+ Vertebral      -24                   1.21                  +----------+--------------+----------+-----------+-------------+ Distal ICA      40                                         +----------+--------------+----------+-----------+-------------+  +------------+-------+-------+             VM cm/sComment +------------+-------+-------+ Prox Basilar  -40          +------------+-------+-------+ Dist Basilar  -57          +------------+-------+-------+ +----------------------+---+ Right Lindegaard Ratio2.2 +----------------------+---+ +---------------------+----+ Left Lindegaard Ratio2.65 +---------------------+----+    Preliminary     Labs:  CBC: Recent Labs    06/05/23 0637 06/06/23 0627 06/10/23 0441 06/11/23 0555  WBC 7.0  6.1 4.3 4.5  HGB 11.1* 12.9 10.8* 10.7*  HCT 33.9* 38.9 33.1* 32.9*  PLT 318 333 289 313    COAGS: Recent Labs    06/03/23 1353  INR 1.1    BMP: Recent Labs    06/06/23 0627 06/07/23 0846 06/10/23 0441 06/11/23 0555  NA 134* 133* 133* 135  K 3.5 3.7 4.1 4.0  CL 102 100 100 102  CO2 23 23 24 25   GLUCOSE 118* 123* 114* 136*  BUN 5* 8 16 10   CALCIUM 9.0 9.2 8.9 8.9  CREATININE 0.67 0.77 0.81 0.63  GFRNONAA >60 >60 >60 >60    LIVER FUNCTION TESTS: Recent Labs    04/03/23 1025 06/03/23 0148  BILITOT 0.6 0.4  AST 19 20  ALT 11 10  ALKPHOS 63 60  PROT 7.4 7.0  ALBUMIN 3.9 3.8    Assessment and Plan:  38 y.o. female inpatient. History of polysubstance abuse, schizophrenia, bipolar. Presented to the ED at [facility] on 12.30.24 with severe headache and AMS. Found to have a small bilateral frontal SAH. 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 No other aneurysm seen. S/p Primary coiling embolization performed with complete aneurysm occlusion. No thromboembolic or hemorrhagic complication with Dr. MARLA. de Nile Erichsen.    Patient seen at bedside with Dr. MARLA. everitt Nile Erichsen.  Patient alert, talking. Safety sitter at bedside.  HA improving, patient able to eat/drink.  Patient may be moved to a regular floor today per critical care team.  No signs of vasospasm, pt on TCD on Mon/Wed/Friday.   NIR will continue to follow along - plans per Critical Care/Neurosurgery    Electronically Signed: Toya VEAR Cousin, PA-C 06/11/2023, 9:49 AM   I spent a total of 15 Minutes at the patient's bedside AND on the patient's hospital floor or unit, greater than 50% of which was counseling/coordinating care for [facility] s/p coiling

## 2023-06-12 ENCOUNTER — Other Ambulatory Visit (HOSPITAL_COMMUNITY): Payer: BLUE CROSS/BLUE SHIELD

## 2023-06-12 DIAGNOSIS — I609 Nontraumatic subarachnoid hemorrhage, unspecified: Secondary | ICD-10-CM | POA: Diagnosis not present

## 2023-06-12 LAB — CBC WITH DIFFERENTIAL/PLATELET
Abs Immature Granulocytes: 0.04 10*3/uL (ref 0.00–0.07)
Basophils Absolute: 0 10*3/uL (ref 0.0–0.1)
Basophils Relative: 1 %
Eosinophils Absolute: 0 10*3/uL (ref 0.0–0.5)
Eosinophils Relative: 0 %
HCT: 32 % — ABNORMAL LOW (ref 36.0–46.0)
Hemoglobin: 10.5 g/dL — ABNORMAL LOW (ref 12.0–15.0)
Immature Granulocytes: 1 %
Lymphocytes Relative: 29 %
Lymphs Abs: 2.2 10*3/uL (ref 0.7–4.0)
MCH: 28.5 pg (ref 26.0–34.0)
MCHC: 32.8 g/dL (ref 30.0–36.0)
MCV: 86.7 fL (ref 80.0–100.0)
Monocytes Absolute: 0.9 10*3/uL (ref 0.1–1.0)
Monocytes Relative: 12 %
Neutro Abs: 4.4 10*3/uL (ref 1.7–7.7)
Neutrophils Relative %: 57 %
Platelets: 367 10*3/uL (ref 150–400)
RBC: 3.69 MIL/uL — ABNORMAL LOW (ref 3.87–5.11)
RDW: 18.8 % — ABNORMAL HIGH (ref 11.5–15.5)
WBC: 7.5 10*3/uL (ref 4.0–10.5)
nRBC: 0 % (ref 0.0–0.2)

## 2023-06-12 LAB — BASIC METABOLIC PANEL
Anion gap: 7 (ref 5–15)
BUN: 12 mg/dL (ref 6–20)
CO2: 28 mmol/L (ref 22–32)
Calcium: 9.1 mg/dL (ref 8.9–10.3)
Chloride: 102 mmol/L (ref 98–111)
Creatinine, Ser: 0.6 mg/dL (ref 0.44–1.00)
GFR, Estimated: >60 mL/min (ref >60)
Glucose, Bld: 95 mg/dL (ref 70–99)
Potassium: 3.8 mmol/L (ref 3.5–5.1)
Sodium: 137 mmol/L (ref 135–145)

## 2023-06-12 LAB — GLUCOSE, CAPILLARY
Glucose-Capillary: 103 mg/dL — ABNORMAL HIGH (ref 70–99)
Glucose-Capillary: 113 mg/dL — ABNORMAL HIGH (ref 70–99)
Glucose-Capillary: 128 mg/dL — ABNORMAL HIGH (ref 70–99)
Glucose-Capillary: 95 mg/dL (ref 70–99)

## 2023-06-12 MED ORDER — NIMODIPINE 30 MG PO CAPS
60.0000 mg | ORAL_CAPSULE | Freq: Four times a day (QID) | ORAL | Status: DC
  Administered 2023-06-12 – 2023-06-13 (×3): 60 mg via ORAL
  Filled 2023-06-12 (×5): qty 2

## 2023-06-12 MED ORDER — MELATONIN 3 MG PO TABS
3.0000 mg | ORAL_TABLET | Freq: Every day | ORAL | Status: DC
  Administered 2023-06-12: 3 mg via ORAL
  Filled 2023-06-12: qty 1

## 2023-06-12 MED ORDER — RISPERIDONE 0.5 MG PO TABS
1.0000 mg | ORAL_TABLET | Freq: Every day | ORAL | Status: DC
  Administered 2023-06-12: 1 mg via ORAL
  Filled 2023-06-12: qty 2
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
## 2023-06-12 NOTE — Progress Notes (Signed)
 Physical Therapy Treatment Patient Details Name: Tiffany [name] MRN: [id number] DOB: 1985/10/09 Today's Date: 06/12/2023   History of Present Illness Pt is a 38 y/o female presenting to [facility] ED on 12/30 with SAH.  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  PMHx:  marijuana addiction, alcohol abuse, cocaine addiction, TN, concussion, depression    PT Comments  Pt resting in bed on arrival and eager for mobility with continued progress towards acute goals. Pt demonstrating increased activity tolerance this session, progressing gait distance significantly without Ad support and no overt LOB noted. Pt with come drift in hall R/L with dual tasking and mild balance challenges, however pt able to self correct in all instances. Pt continues to benefit from skilled PT services to progress toward functional mobility goals.     If plan is discharge home, recommend the following: A little help with walking and/or transfers;A little help with bathing/dressing/bathroom;Direct supervision/assist for medications management;Direct supervision/assist for financial management   Can travel by private vehicle     Yes  Equipment Recommendations  None recommended by PT    Recommendations for Other Services       Precautions / Restrictions Precautions Precautions: Fall Restrictions Weight Bearing Restrictions Per Provider Order: No     Mobility  Bed Mobility Overal bed mobility: Needs Assistance Bed Mobility: Supine to Sit, Sit to Supine     Supine to sit: Independent Sit to supine: Independent        Transfers Overall transfer level: Needs assistance Equipment used: None Transfers: Sit to/from Stand Sit to Stand: Supervision           General transfer comment: supervision for safety    Ambulation/Gait Ambulation/Gait assistance: Supervision Gait Distance (Feet): 800 Feet Assistive device: None Gait  Pattern/deviations: Step-through pattern, Decreased stride length, Wide base of support Gait velocity: WFL     General Gait Details: some R/L drift in hall with environemental scanning with no overt LOB noted, narrow BOS, pt able to wayfind mildly complex path back to room   Stairs             Wheelchair Mobility     Tilt Bed    Modified Rankin (Stroke Patients Only) Modified Rankin (Stroke Patients Only) Modified Rankin: Moderate disability     Balance Overall balance assessment: Needs assistance Sitting-balance support: Feet supported Sitting balance-Leahy Scale: Good Sitting balance - Comments: pt can correct for poor posture to upright sitting for a short time and then she starts writhing again with head and upper body listing forward and to the R   Standing balance support: No upper extremity supported Standing balance-Leahy Scale: Fair Standing balance comment: able to accept mild balance challenges dynamically without LOB                            Cognition Arousal: Alert Behavior During Therapy: WFL for tasks assessed/performed, Flat affect, Impulsive Overall Cognitive Status: Impaired/Different from baseline Area of Impairment: Attention, Memory, Following commands, Awareness, Problem solving                   Current Attention Level: Sustained Memory: Decreased short-term memory Following Commands: Follows one step commands consistently, Follows multi-step commands consistently, Follows multi-step commands with increased time   Awareness: Intellectual Problem Solving: Slow processing, Difficulty sequencing General Comments: following multistep commands with multimodal cues and time        Exercises  General Comments General comments (skin integrity, edema, etc.): VSS      Pertinent Vitals/Pain Pain Assessment Pain Assessment: No/denies pain Pain Intervention(s): Limited activity within patient's tolerance, Monitored  during session    Home Living                          Prior Function            PT Goals (current goals can now be found in the care plan section) Acute Rehab PT Goals Patient Stated Goal: I need to be ready for going home before I get there. PT Goal Formulation: With patient Time For Goal Achievement: 06/19/23 Progress towards PT goals: Progressing toward goals    Frequency    Min 1X/week      PT Plan      Co-evaluation              AM-PAC PT 6 Clicks Mobility   Outcome Measure  Help needed turning from your back to your side while in a flat bed without using bedrails?: A Little Help needed moving from lying on your back to sitting on the side of a flat bed without using bedrails?: A Little Help needed moving to and from a bed to a chair (including a wheelchair)?: A Little Help needed standing up from a chair using your arms (e.g., wheelchair or bedside chair)?: A Little Help needed to walk in hospital room?: A Little Help needed climbing 3-5 steps with a railing? : A Little 6 Click Score: 18    End of Session   Activity Tolerance: Patient tolerated treatment well Patient left: in bed;with call bell/phone within reach;with nursing/sitter in room Nurse Communication: Mobility status PT Visit Diagnosis: Other abnormalities of gait and mobility (R26.89);Other symptoms and signs involving the nervous system (R29.898)     Time: 8964-8954 PT Time Calculation (min) (ACUTE ONLY): 10 min  Charges:    $Gait Training: 8-22 mins PT General Charges $$ ACUTE PT VISIT: 1 Visit                     London Nonaka R. PTA Acute Rehabilitation Services Office: [phone]   Tiffany [name] 06/12/2023, 2:37 PM

## 2023-06-12 NOTE — Plan of Care (Signed)
  Problem: Clinical Measurements: Goal: Ability to maintain clinical measurements within normal limits will improve Outcome: Progressing   Problem: Activity: Goal: Risk for activity intolerance will decrease Outcome: Progressing   Problem: Nutrition: Goal: Adequate nutrition will be maintained Outcome: Progressing   Problem: Coping: Goal: Level of anxiety will decrease Outcome: Progressing   Problem: Pain Management: Goal: General experience of comfort will improve Outcome: Progressing   Problem: Elimination: Goal: Will not experience complications related to bowel motility Outcome: Progressing   Problem: Skin Integrity: Goal: Risk for impaired skin integrity will decrease Outcome: Progressing   Problem: Safety: Goal: Ability to remain free from injury will improve Outcome: Progressing   Problem: Activity: Goal: Ability to tolerate increased activity will improve Outcome: Progressing

## 2023-06-12 NOTE — Progress Notes (Signed)
 PROGRESS NOTE   Tiffany [name]  FMW:[id number]    DOB: July 21, 1985    DOA: 06/03/2023  PCP: Pcp, No   I have briefly reviewed patients previous medical records in [facility].  Chief Complaint  Patient presents with   Headache    Brief Hospital Course:  37 year old female with pertinent PMH polysubstance abuse (marijuana, cocaine), alcohol abuse, depression, and HTN but not on any medications presents to [facility] ED on 12/30 with SAH.   On 12/30 patient complaining of severe headache and called EMS.  On their arrival patient combative and verbally abusive.  Multiple open alcohol bottles on scene.  She stated multiple times that she wanted to go home and kill herself.  Patient brought to [facility] ED.  On arrival patient remained agitated and required Haldol  and soft restraints.  Initial BP 167/102.  Patient protecting airway on room air.  Afebrile.  CT head showing common aneurysm with small bilateral frontal SAH.  Neuro consulted and recommended CTA head.  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 NSG & NIR consulted and underwent coil embolization. Started on Cleviprex , now off. PCCM transferred care over to TRH and progressive unit on 1/8.   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  Duration of dexamethasone  and need to taper. Neurosurgery has signed off TCD's remained stable, DC'd TCDs on floor Continue nimodipine  to complete 21 days therapy.  Currently on 60 Mg every 4 hours, interrupts already disturbed sleep.  Discussed with PCCM and changed dose to 60 mg 4 times daily. Maintain euvolemia Ambulating without difficulty. Discontinued trazodone  and added melatonin at  bedtime for insomnia.   Hyponatremia, resolved   Polysubstance abuse Alcohol use disorder No signs of withdrawal Counseling provided regarding quit substances Continue thiamine  and folate Continue as needed Ativan  which she has not needed for several days now, discontinued.  Normocytic anemia Stable.   Depression/homicidal ideation Appreciate psychiatry follow-up Thiamine /folate Continue Haldol  if needed.  It appears that she got a dose last night mostly due to inability to sleep and refusing trazodone  as per nursing report. Continue trazodone -patient states that this is not helping her sleep. IVC and suicide sitter until she can contract for safety Patient is medically cleared to be discharged to inpatient psychiatric unit.  Will ask psychiatry to reassess.  Body mass index is 20.15 kg/m.  DVT prophylaxis: enoxaparin  (LOVENOX ) injection 40 mg Start: 06/05/23 1300 SCDs Start: 06/03/23 0547     Code Status: Full Code:  Family Communication: None at bedside. Disposition:  Status is: Inpatient Remains inpatient appropriate because: Hopefully can DC to inpatient psychiatry in the next 24 hours.     Consultants:   Psychiatry Interventional radiology Neurosurgery PCCM  Procedures:   As noted above.  Antimicrobials:      Subjective:  Patient seen this morning along with safety sitter at bedside.  Specifically denied headache, suicidal or homicidal ideations.  States that she has been unable to sleep at night.  Objective:   Vitals:   06/12/23 0358 06/12/23 0731 06/12/23 1114 06/12/23 1624  BP: 110/65 110/74 97/60 112/65  Pulse: 60 (!) 56 67 65  Resp: 18 17 18 17   Temp: 98.4 F (36.9 C) 97.9 F (36.6 C)  98 F (36.7 C) 99.1 F (37.3 C)  TempSrc: Oral Oral Oral Oral  SpO2: 100% 100% 100% 100%  Weight:      Height:        General exam: Young female, moderately built and nourished, sitting up comfortably in bed and then seen ambulating steadily in the hall with  nursing supervision. Respiratory system: Clear to auscultation. Respiratory effort normal. Cardiovascular system: S1 & S2 heard, RRR. No JVD, murmurs, rubs, gallops or clicks. No pedal edema.  Telemetry personally reviewed: SB in the 50s-SR in the 60s. Gastrointestinal system: Abdomen is nondistended, soft and nontender. No organomegaly or masses felt. Normal bowel sounds heard. Central nervous system: Alert and oriented. No focal neurological deficits. Extremities: Symmetric 5 x 5 power. Skin: No rashes, lesions or ulcers Psychiatry: Judgement and insight appear normal. Mood & affect appropriate.     Data Reviewed:   I have personally reviewed following labs and imaging studies   CBC: Recent Labs  Lab 06/10/23 0441 06/11/23 0555 06/12/23 0958  WBC 4.3 4.5 7.5  NEUTROABS 3.0 3.0 4.4  HGB 10.8* 10.7* 10.5*  HCT 33.1* 32.9* 32.0*  MCV 85.1 86.1 86.7  PLT 289 313 367    Basic Metabolic Panel: Recent Labs  Lab 06/06/23 0627 06/07/23 0846 06/10/23 0441 06/11/23 0555 06/12/23 0958  NA 134* 133* 133* 135 137  K 3.5 3.7 4.1 4.0 3.8  CL 102 100 100 102 102  CO2 23 23 24 25 28   GLUCOSE 118* 123* 114* 136* 95  BUN 5* 8 16 10 12   CREATININE 0.67 0.77 0.81 0.63 0.60  CALCIUM 9.0 9.2 8.9 8.9 9.1  MG  --  2.0 1.9  --   --   PHOS  --  3.8 4.3  --   --     Liver Function Tests: No results for input(s): AST, ALT, ALKPHOS, BILITOT, PROT, ALBUMIN in the last 168 hours.  CBG: Recent Labs  Lab 06/12/23 0738 06/12/23 1116 06/12/23 1622  GLUCAP 103* 113* 128*    Microbiology Studies:   Recent Results (from the past 240 hours)  MRSA Next Gen by PCR, Nasal     Status: None   Collection Time: 06/03/23 10:19 PM   Specimen: Nasal Mucosa; Nasal Swab  Result Value Ref Range Status   MRSA by PCR Next Gen NOT DETECTED NOT DETECTED Final    Comment: (NOTE) The GeneXpert MRSA Assay (FDA approved for NASAL specimens only), is one component of a comprehensive MRSA  colonization surveillance program. It is not intended to diagnose MRSA infection nor to guide or monitor treatment for MRSA infections. Test performance is not FDA approved in patients less than 38 years old. Performed at [facility], [address] [place], [place] [address]     Radiology Studies:  No results found.  Scheduled Meds:    Chlorhexidine  Gluconate Cloth  6 each Topical Daily   dexamethasone   2 mg Oral Q12H   Followed by   NOREEN ON 06/14/2023] dexamethasone   2 mg Oral Daily   enoxaparin  (LOVENOX ) injection  40 mg Subcutaneous Q24H   insulin  aspart  0-9 Units Subcutaneous TID AC & HS   multivitamin with minerals  1 tablet Oral Daily   nicotine   7 mg Transdermal Daily   niMODipine   60 mg Oral Q4H   polyethylene glycol  17 g Oral Daily   risperiDONE   1 mg Oral QHS   senna-docusate  1 tablet Oral BID   thiamine   100 mg Oral  Daily    Continuous Infusions:     LOS: 9 days     Trenda Mar, MD,  FACP, [facility], [facility], [facility]   [facility]  Hospitalist & Physician Advisor [place]      To contact the attending provider between 7A-7P or the covering provider during after hours 7P-7A, please log into the web site [email or web address] and access using universal [place] password for that web site. If you do not have the password, please call the hospital operator.  06/12/2023, 5:52 PM

## 2023-06-12 NOTE — Progress Notes (Signed)
 Referring Physician(s): Dr. JINNY Budge  Supervising Physician: Everitt Sarks Rodrigues, Katyucia  Patient Status:  Tiffany [name], Tiffany [name]  Chief Complaint:  Follow up [name] s/p primary coiling embolization in NIR 06/03/23   Subjective:  Patient seen at bedside with Dr. MARLA. everitt Sarks Erichsen.  She is laying in bed NDA, sitter at bed side. HA cont to improve Still has some issues with her left leg/hip, able to work with PT and denies any weakness.    Allergies: Shellfish allergy, Pollen extract, and Aspirin  Medications:  Current Facility-Administered Medications:    acetaminophen  (TYLENOL ) tablet 650 mg, 650 mg, Oral, Q4H PRN, Byrum, Robert S, MD, 650 mg at 06/12/23 0152   Chlorhexidine  Gluconate Cloth 2 % PADS 6 each, 6 each, Topical, Daily, Byrum, Robert S, MD, 6 each at 06/12/23 1008   dexamethasone  (DECADRON ) tablet 2 mg, 2 mg, Oral, Q12H, 2 mg at 06/12/23 1008 **FOLLOWED BY** [START ON 06/14/2023] dexamethasone  (DECADRON ) tablet 2 mg, 2 mg, Oral, Daily, Chand, Sudham, MD   enoxaparin  (LOVENOX ) injection 40 mg, 40 mg, Subcutaneous, Q24H, de Macedo Rodrigues, Katyucia, MD, 40 mg at 06/12/23 1250   haloperidol  (HALDOL ) tablet 5 mg, 5 mg, Oral, Q8H PRN, 5 mg at 06/11/23 2159 **OR** haloperidol  lactate (HALDOL ) injection 5 mg, 5 mg, Intramuscular, Q8H PRN, Chien, Stephanie, MD, 5 mg at 06/08/23 0024   insulin  aspart (novoLOG ) injection 0-9 Units, 0-9 Units, Subcutaneous, TID AC & HS, Paliwal, Aditya, MD, 1 Units at 06/11/23 1722   multivitamin with minerals tablet 1 tablet, 1 tablet, Oral, Daily, Byrum, Robert S, MD, 1 tablet at 06/12/23 1008   nicotine  (NICODERM CQ  - dosed in mg/24 hr) patch 7 mg, 7 mg, Transdermal, Daily, Chand, Sudham, MD, 7 mg at 06/12/23 1009   niMODipine  (NIMOTOP ) capsule 60 mg, 60 mg, Oral, Q4H, Byrum, Robert S, MD, 60 mg at 06/12/23 1249   ondansetron  (ZOFRAN ) injection 4 mg, 4 mg, Intravenous, Q6H PRN, de Macedo Rodrigues, Katyucia, MD   polyethylene glycol (MIRALAX  /  GLYCOLAX ) packet 17 g, 17 g, Per Tube, Daily PRN, Byrum, Robert S, MD   polyethylene glycol (MIRALAX  / GLYCOLAX ) packet 17 g, 17 g, Oral, Daily, Chand, Sudham, MD, 17 g at 06/12/23 1008   risperiDONE  (RISPERDAL ) tablet 1 mg, 1 mg, Oral, QHS, Lenard Calin, MD   senna-docusate (Senokot-S) tablet 1 tablet, 1 tablet, Oral, BID, Harold Scholz, MD, 1 tablet at 06/12/23 1008   [COMPLETED] thiamine  (VITAMIN B1) tablet 200 mg, 200 mg, Oral, Daily, 200 mg at 06/06/23 0916 **FOLLOWED BY** thiamine  (VITAMIN B1) tablet 100 mg, 100 mg, Oral, Daily, Tanda Powell ORN, RPH, 100 mg at 06/12/23 1008   traMADol  (ULTRAM ) tablet 50 mg, 50 mg, Oral, Q6H PRN, Chand, Sudham, MD, 50 mg at 06/12/23 1249   traZODone  (DESYREL ) tablet 50 mg, 50 mg, Oral, QHS PRN, Cinderella, Margaret A, 50 mg at 06/09/23 2030    Vital Signs: BP 97/60 (BP Location: Left Arm)   Pulse 67   Temp 98 F (36.7 C) (Oral)   Resp 18   Ht 5' 5.98 (1.676 m)   Wt 124 lb 12.5 oz (56.6 kg)   SpO2 100%   BMI 20.15 kg/m   Physical Exam Vitals and nursing note reviewed.  Constitutional:      Appearance: She is well-developed.  HENT:     Head: Normocephalic and atraumatic.  Cardiovascular:     Rate and Rhythm: Normal rate and regular rhythm.  Pulmonary:     Effort: Pulmonary effort is  normal.  Musculoskeletal:        General: Normal range of motion.     Cervical back: Normal range of motion and neck supple.  Skin:    General: Skin is warm and dry.     Comments: (R)groin puncture site soft, NT. Good pulse  Neurological:     Mental Status: She is alert. Mental status is at baseline.  Psychiatric:        Mood and Affect: Mood normal.        Behavior: Behavior normal.     Imaging: VAS US  TRANSCRANIAL DOPPLER Result Date: 06/11/2023  Transcranial Doppler Patient Name:  Tiffany [name]  Date of Exam:   06/10/2023 Medical Rec #: [id number]         Accession #:    [id number] Date of Birth: [birth year]          Patient Gender: F Patient Age:    38 years Exam Location:  [facility] Procedure:      VAS US  TRANSCRANIAL DOPPLER Referring Phys: CURTIS DE MACEDO RODRIGUES --------------------------------------------------------------------------------  Indications: Subarachnoid hemorrhage. Performing Technologist: Ricka Holland RDMS, RVT  Examination Guidelines: A complete evaluation includes B-mode imaging, spectral Doppler, color Doppler, and power Doppler as needed of all accessible portions of each vessel. Bilateral testing is considered an integral part of a complete examination. Limited examinations for reoccurring indications may be performed as noted.  +----------+---------------+----------+-----------+-------------+ RIGHT TCD Right VM (cm/s)Depth (cm)Pulsatility   Comment    +----------+---------------+----------+-----------+-------------+ MCA             103                   1.05                  +----------+---------------+----------+-----------+-------------+ ACA             -62                   0.82                  +----------+---------------+----------+-----------+-------------+ Term ICA        93                    0.93                  +----------+---------------+----------+-----------+-------------+ PCA P1          49                    0.95                  +----------+---------------+----------+-----------+-------------+ Opthalmic       18                    0.95                  +----------+---------------+----------+-----------+-------------+ ICA siphon                                    NOT INSONATED +----------+---------------+----------+-----------+-------------+ Vertebral       -30                   1.11                  +----------+---------------+----------+-----------+-------------+ Distal ICA      41                                          +----------+---------------+----------+-----------+-------------+  +----------+--------------+----------+-----------+-------+  LEFT TCD  Left VM (cm/s)Depth (cm)PulsatilityComment +----------+--------------+----------+-----------+-------+ MCA            110                   1.07            +----------+--------------+----------+-----------+-------+ ACA            -62                   1.10            +----------+--------------+----------+-----------+-------+ Term ICA        67                   0.78            +----------+--------------+----------+-----------+-------+ PCA P1          16                   1.07            +----------+--------------+----------+-----------+-------+ Opthalmic       19                   1.43            +----------+--------------+----------+-----------+-------+ ICA siphon      54                   0.69            +----------+--------------+----------+-----------+-------+ Vertebral      -33                   1.01            +----------+--------------+----------+-----------+-------+ Distal ICA      35                                   +----------+--------------+----------+-----------+-------+  +------------+-------+-------+             VM cm/sComment +------------+-------+-------+ Dist Basilar  -30          +------------+-------+-------+ +----------------------+---+ Right Lindegaard Ratio2.5 +----------------------+---+ +---------------------+---+ Left Lindegaard Ratio3.1 +---------------------+---+  Summary:  Elevated bilateral middle cerebral aretry mean flow velocities suggets mild vasospasm. *See table(s) above for TCD measurements and observations.  Diagnosing physician: Eather Popp MD Electronically signed by Eather Popp MD on 06/11/2023 at 4:45:54 PM.    Final     Labs:  CBC: Recent Labs    06/06/23 0627 06/10/23 0441 06/11/23 0555 06/12/23 0958  WBC 6.1 4.3 4.5 7.5  HGB 12.9 10.8* 10.7* 10.5*  HCT 38.9 33.1* 32.9* 32.0*  PLT 333 289 313 367    COAGS: Recent Labs    06/03/23 1353  INR 1.1    BMP: Recent Labs     06/07/23 0846 06/10/23 0441 06/11/23 0555 06/12/23 0958  NA 133* 133* 135 137  K 3.7 4.1 4.0 3.8  CL 100 100 102 102  CO2 23 24 25 28   GLUCOSE 123* 114* 136* 95  BUN 8 16 10 12   CALCIUM 9.2 8.9 8.9 9.1  CREATININE 0.77 0.81 0.63 0.60  GFRNONAA >60 >60 >60 >60    LIVER FUNCTION TESTS: Recent Labs    04/03/23 1025 06/03/23 0148  BILITOT 0.6 0.4  AST 19 20  ALT 11 10  ALKPHOS 63 60  PROT 7.4 7.0  ALBUMIN 3.9 3.8    Assessment and Plan:  37  y.o. female inpatient. History of polysubstance abuse, schizophrenia, bipolar. Presented to the ED at [name] on 12.30.24 with severe headache and AMS. Found to have a small bilateral frontal SAH. 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 No other aneurysm seen. S/p Primary coiling embolization performed with complete aneurysm occlusion. No thromboembolic or hemorrhagic complication with Dr. MARLA. de [place] Erichsen.    Patient alert, talking. Safety sitter at bedside.  HA improving, patient able to eat/drink.  No signs of vasospasm, pt on TCD on Mon/Wed/Friday.   NIR will continue to follow along - plans per Critical Care/Neurosurgery    Electronically Signed: Franky Rusk, PA-C 06/12/2023, 1:11 PM   I spent a total of 15 Minutes at the patient's bedside AND on the patient's [name] floor or unit, greater than 50% of which was counseling/coordinating care for Tiffany [name] s/p coiling

## 2023-06-13 ENCOUNTER — Encounter (HOSPITAL_COMMUNITY): Payer: Self-pay

## 2023-06-13 ENCOUNTER — Inpatient Hospital Stay (HOSPITAL_COMMUNITY)
Admission: AD | Admit: 2023-06-13 | Discharge: 2023-06-17 | DRG: 885 | Disposition: A | Discharge status: Home / Self Care | Payer: BLUE CROSS/BLUE SHIELD | Source: Intra-hospital | Attending: Psychiatry | Admitting: Psychiatry

## 2023-06-13 ENCOUNTER — Encounter (HOSPITAL_COMMUNITY): Payer: Self-pay | Admitting: Pulmonary Disease

## 2023-06-13 ENCOUNTER — Other Ambulatory Visit: Payer: Self-pay

## 2023-06-13 DIAGNOSIS — F1020 Alcohol dependence, uncomplicated: Secondary | ICD-10-CM | POA: Diagnosis present

## 2023-06-13 DIAGNOSIS — I609 Nontraumatic subarachnoid hemorrhage, unspecified: Principal | ICD-10-CM

## 2023-06-13 DIAGNOSIS — F250 Schizoaffective disorder, bipolar type: Secondary | ICD-10-CM | POA: Diagnosis present

## 2023-06-13 DIAGNOSIS — F1220 Cannabis dependence, uncomplicated: Secondary | ICD-10-CM | POA: Diagnosis present

## 2023-06-13 DIAGNOSIS — Z91199 Patient's noncompliance with other medical treatment and regimen due to unspecified reason: Secondary | ICD-10-CM

## 2023-06-13 DIAGNOSIS — Z8249 Family history of ischemic heart disease and other diseases of the circulatory system: Secondary | ICD-10-CM | POA: Diagnosis not present

## 2023-06-13 DIAGNOSIS — I10 Essential (primary) hypertension: Secondary | ICD-10-CM | POA: Diagnosis present

## 2023-06-13 DIAGNOSIS — Z87820 Personal history of traumatic brain injury: Secondary | ICD-10-CM

## 2023-06-13 DIAGNOSIS — F1090 Alcohol use, unspecified, uncomplicated: Secondary | ICD-10-CM | POA: Diagnosis present

## 2023-06-13 DIAGNOSIS — Z5941 Food insecurity: Secondary | ICD-10-CM | POA: Diagnosis not present

## 2023-06-13 DIAGNOSIS — Z886 Allergy status to analgesic agent status: Secondary | ICD-10-CM | POA: Diagnosis not present

## 2023-06-13 DIAGNOSIS — F17210 Nicotine dependence, cigarettes, uncomplicated: Secondary | ICD-10-CM | POA: Diagnosis present

## 2023-06-13 DIAGNOSIS — Z716 Tobacco abuse counseling: Secondary | ICD-10-CM | POA: Diagnosis not present

## 2023-06-13 DIAGNOSIS — Z5982 Transportation insecurity: Secondary | ICD-10-CM | POA: Diagnosis not present

## 2023-06-13 DIAGNOSIS — F1410 Cocaine abuse, uncomplicated: Secondary | ICD-10-CM | POA: Diagnosis present

## 2023-06-13 DIAGNOSIS — I602 Nontraumatic subarachnoid hemorrhage from anterior communicating artery: Secondary | ICD-10-CM | POA: Diagnosis not present

## 2023-06-13 DIAGNOSIS — F10221 Alcohol dependence with intoxication delirium: Secondary | ICD-10-CM | POA: Diagnosis not present

## 2023-06-13 DIAGNOSIS — G9340 Encephalopathy, unspecified: Secondary | ICD-10-CM | POA: Diagnosis not present

## 2023-06-13 DIAGNOSIS — Z79899 Other long term (current) drug therapy: Secondary | ICD-10-CM | POA: Diagnosis not present

## 2023-06-13 DIAGNOSIS — Z888 Allergy status to other drugs, medicaments and biological substances status: Secondary | ICD-10-CM

## 2023-06-13 DIAGNOSIS — Z91013 Allergy to seafood: Secondary | ICD-10-CM | POA: Diagnosis not present

## 2023-06-13 DIAGNOSIS — G4700 Insomnia, unspecified: Secondary | ICD-10-CM | POA: Diagnosis present

## 2023-06-13 DIAGNOSIS — E871 Hypo-osmolality and hyponatremia: Secondary | ICD-10-CM | POA: Diagnosis not present

## 2023-06-13 LAB — CBC WITH DIFFERENTIAL/PLATELET
Abs Immature Granulocytes: 0.04 10*3/uL (ref 0.00–0.07)
Basophils Absolute: 0.1 10*3/uL (ref 0.0–0.1)
Basophils Relative: 1 %
Eosinophils Absolute: 0 10*3/uL (ref 0.0–0.5)
Eosinophils Relative: 0 %
HCT: 31.3 % — ABNORMAL LOW (ref 36.0–46.0)
Hemoglobin: 10.2 g/dL — ABNORMAL LOW (ref 12.0–15.0)
Immature Granulocytes: 1 %
Lymphocytes Relative: 25 %
Lymphs Abs: 2.1 10*3/uL (ref 0.7–4.0)
MCH: 28 pg (ref 26.0–34.0)
MCHC: 32.6 g/dL (ref 30.0–36.0)
MCV: 86 fL (ref 80.0–100.0)
Monocytes Absolute: 0.9 10*3/uL (ref 0.1–1.0)
Monocytes Relative: 10 %
Neutro Abs: 5.6 10*3/uL (ref 1.7–7.7)
Neutrophils Relative %: 63 %
Platelets: 440 10*3/uL — ABNORMAL HIGH (ref 150–400)
RBC: 3.64 MIL/uL — ABNORMAL LOW (ref 3.87–5.11)
RDW: 19.1 % — ABNORMAL HIGH (ref 11.5–15.5)
WBC: 8.8 10*3/uL (ref 4.0–10.5)
nRBC: 0 % (ref 0.0–0.2)

## 2023-06-13 LAB — BASIC METABOLIC PANEL
Anion gap: 11 (ref 5–15)
BUN: 17 mg/dL (ref 6–20)
CO2: 24 mmol/L (ref 22–32)
Calcium: 9 mg/dL (ref 8.9–10.3)
Chloride: 101 mmol/L (ref 98–111)
Creatinine, Ser: 0.86 mg/dL (ref 0.44–1.00)
GFR, Estimated: >60 mL/min (ref >60)
Glucose, Bld: 120 mg/dL — ABNORMAL HIGH (ref 70–99)
Potassium: 3.7 mmol/L (ref 3.5–5.1)
Sodium: 136 mmol/L (ref 135–145)

## 2023-06-13 LAB — GLUCOSE, CAPILLARY
Glucose-Capillary: 136 mg/dL — ABNORMAL HIGH (ref 70–99)
Glucose-Capillary: 165 mg/dL — ABNORMAL HIGH (ref 70–99)
Glucose-Capillary: 204 mg/dL — ABNORMAL HIGH (ref 70–99)
Glucose-Capillary: 98 mg/dL (ref 70–99)

## 2023-06-13 MED ORDER — ACETAMINOPHEN 325 MG PO TABS
650.0000 mg | ORAL_TABLET | ORAL | Status: DC | PRN
  Administered 2023-06-16: 650 mg via ORAL
  Filled 2023-06-13 (×8): qty 2

## 2023-06-13 MED ORDER — NICOTINE 7 MG/24HR TD PT24
7.0000 mg | MEDICATED_PATCH | Freq: Every day | TRANSDERMAL | Status: DC
  Administered 2023-06-15: 7 mg via TRANSDERMAL
  Filled 2023-06-13 (×7): qty 1

## 2023-06-13 MED ORDER — RISPERIDONE 2 MG PO TABS
2.0000 mg | ORAL_TABLET | Freq: Every day | ORAL | Status: DC
  Administered 2023-06-13 – 2023-06-16 (×4): 2 mg via ORAL
  Filled 2023-06-13 (×7): qty 1

## 2023-06-13 MED ORDER — HALOPERIDOL 5 MG PO TABS: 5.0000 mg | ORAL_TABLET | Freq: Three times a day (TID) | ORAL | Status: DC | PRN

## 2023-06-13 MED ORDER — HALOPERIDOL LACTATE 5 MG/ML IJ SOLN: 5.0000 mg | Freq: Three times a day (TID) | INTRAMUSCULAR | Status: DC | PRN

## 2023-06-13 MED ORDER — ALUM & MAG HYDROXIDE-SIMETH 200-200-20 MG/5ML PO SUSP
30.0000 mL | ORAL | Status: DC | PRN
Start: 2023-06-13 — End: 2023-06-17

## 2023-06-13 MED ORDER — SENNOSIDES-DOCUSATE SODIUM 8.6-50 MG PO TABS: 1.0000 | ORAL_TABLET | Freq: Two times a day (BID) | ORAL | Status: DC

## 2023-06-13 MED ORDER — NIMODIPINE 30 MG PO CAPS: 60.0000 mg | ORAL_CAPSULE | Freq: Four times a day (QID) | ORAL | Status: DC

## 2023-06-13 MED ORDER — LORAZEPAM 2 MG/ML IJ SOLN: 2.0000 mg | Freq: Three times a day (TID) | INTRAMUSCULAR | Status: DC | PRN

## 2023-06-13 MED ORDER — TRAMADOL HCL 50 MG PO TABS: 50.0000 mg | ORAL_TABLET | Freq: Three times a day (TID) | ORAL | Status: DC | PRN

## 2023-06-13 MED ORDER — ACETAMINOPHEN 325 MG PO TABS: 650.0000 mg | ORAL_TABLET | ORAL | Status: DC | PRN

## 2023-06-13 MED ORDER — POLYETHYLENE GLYCOL 3350 17 G PO PACK: 17.0000 g | PACK | Freq: Every day | ORAL | Status: DC

## 2023-06-13 MED ORDER — VITAMIN B-1 100 MG PO TABS: 100.0000 mg | ORAL_TABLET | Freq: Every day | ORAL | Status: DC

## 2023-06-13 MED ORDER — ADULT MULTIVITAMIN W/MINERALS CH
1.0000 | ORAL_TABLET | Freq: Every day | ORAL | Status: DC
  Administered 2023-06-14 – 2023-06-17 (×4): 1 via ORAL
  Filled 2023-06-13 (×8): qty 1

## 2023-06-13 MED ORDER — TRAMADOL HCL 50 MG PO TABS
50.0000 mg | ORAL_TABLET | Freq: Four times a day (QID) | ORAL | Status: DC | PRN
  Administered 2023-06-13 – 2023-06-16 (×4): 50 mg via ORAL
  Filled 2023-06-13 (×4): qty 1

## 2023-06-13 MED ORDER — MELATONIN 3 MG PO TABS
3.0000 mg | ORAL_TABLET | Freq: Every day | ORAL | Status: DC
  Administered 2023-06-13 – 2023-06-14 (×2): 3 mg via ORAL
  Filled 2023-06-13 (×4): qty 1

## 2023-06-13 MED ORDER — NICOTINE 7 MG/24HR TD PT24: 7.0000 mg | MEDICATED_PATCH | Freq: Every day | TRANSDERMAL | Status: DC

## 2023-06-13 MED ORDER — VITAMIN B-1 100 MG PO TABS
100.0000 mg | ORAL_TABLET | Freq: Every day | ORAL | Status: DC
  Administered 2023-06-14 – 2023-06-17 (×4): 100 mg via ORAL
  Filled 2023-06-13 (×8): qty 1

## 2023-06-13 MED ORDER — RISPERIDONE 2 MG PO TABS: 2.0000 mg | ORAL_TABLET | Freq: Every day | ORAL | Status: DC

## 2023-06-13 MED ORDER — MELATONIN 3 MG PO TABS: 3.0000 mg | ORAL_TABLET | Freq: Every day | ORAL | Status: DC

## 2023-06-13 MED ORDER — ADULT MULTIVITAMIN W/MINERALS CH: 1.0000 | ORAL_TABLET | Freq: Every day | ORAL | Status: DC

## 2023-06-13 MED ORDER — RISPERIDONE 0.5 MG PO TABS: 2.0000 mg | ORAL_TABLET | Freq: Every day | ORAL | Status: DC

## 2023-06-13 MED ORDER — NIMODIPINE 30 MG PO CAPS
60.0000 mg | ORAL_CAPSULE | Freq: Four times a day (QID) | ORAL | Status: DC
  Administered 2023-06-14 – 2023-06-17 (×13): 60 mg via ORAL
  Filled 2023-06-13 (×25): qty 2

## 2023-06-13 MED ORDER — POLYETHYLENE GLYCOL 3350 17 G PO PACK: 17.0000 g | PACK | Freq: Every day | ORAL | Status: DC | PRN

## 2023-06-13 MED ORDER — CHLORHEXIDINE GLUCONATE CLOTH 2 % EX PADS
6.0000 | MEDICATED_PAD | Freq: Every day | CUTANEOUS | Status: DC
  Filled 2023-06-13 (×2): qty 6

## 2023-06-13 MED ORDER — ONDANSETRON HCL 4 MG/2ML IJ SOLN: 4.0000 mg | Freq: Four times a day (QID) | INTRAMUSCULAR | Status: DC | PRN

## 2023-06-13 MED ORDER — MAGNESIUM HYDROXIDE 400 MG/5ML PO SUSP
30.0000 mL | Freq: Every day | ORAL | Status: DC | PRN
  Administered 2023-06-16: 30 mL via ORAL
  Filled 2023-06-13: qty 30

## 2023-06-13 MED ORDER — DIPHENHYDRAMINE HCL 50 MG/ML IJ SOLN: 50.0000 mg | Freq: Three times a day (TID) | INTRAMUSCULAR | Status: DC | PRN

## 2023-06-13 MED ORDER — ACETAMINOPHEN 325 MG PO TABS
650.0000 mg | ORAL_TABLET | Freq: Four times a day (QID) | ORAL | Status: DC | PRN
  Administered 2023-06-14 – 2023-06-17 (×5): 650 mg via ORAL
  Filled 2023-06-13: qty 2

## 2023-06-13 NOTE — Progress Notes (Signed)
 Patient became very upset following meeting with psych. She was believing that she would be discharged home with her friend Garrel and not her sister. This was not the plan and as a result she insisted that she was leaving although she is involuntary committed. I attempted to explained what this means to the patient, however she began to walk out into the hallway then back in the room trying to open windows stating she was leaving. Both attending and psych MD were made aware. Security and [employer] were called for support. The patient began yelling, screaming, spitting and swinging at the officers. Medication was given. Patient the became tearful and apologetic to one of the security guards. Sitter still at bedside.

## 2023-06-13 NOTE — Progress Notes (Signed)
 Tiffany [name] is a 38 y.o. female involuntarily admitted for substance use disorder. Pt has been calm and cooperative with the admission process. Pt stated she went to the Ed because she was having server headache. CT showed ruptured artery aneurysm. Pt denies SI/HI, AVH and verbally contracted. Skin search revealed incision wound to the right groin. Consents signed, belongings search completed and pt oriented to unit. Pt stable at this time. Pt given the opportunity to express concerns and ask questions. Pt given toiletries. Will continue to monitor.

## 2023-06-13 NOTE — Discharge Summary (Addendum)
 Physician Discharge Summary  Tiffany [name] FMW:[id number] DOB: 04-05-1986  PCP: Pcp, No  Admitted from: Home Discharged to: [facility].  Admit date: 06/03/2023 Discharge date: 06/13/2023  Recommendations for Outpatient Follow-up:    Follow-up Information     Tiffany Pupa, MD. Schedule an appointment as soon as possible for a visit.   Specialty: Neurosurgery Why: Call for an appointment to be seen in a couple of weeks from hospital discharge., Hospital Follow Up Contact information: [address] [place] [address] [phone]                  Home Health: None    Equipment/Devices: None    Discharge Condition: Improved and stable from a medical standpoint but still having significant behavioral health issues.   Code Status: Full Code Diet recommendation:  Discharge Diet Orders (From admission, onward)     Start     Ordered   06/13/23 0000  Diet general        06/13/23 1500             Discharge Diagnoses:  Principal Problem:   SAH (subarachnoid hemorrhage) (HCC) Active Problems:   Polysubstance abuse (HCC)   Bipolar affective disorder [facility])   Brief Hospital Course:  38 year old female with pertinent PMH polysubstance abuse (marijuana, cocaine), alcohol abuse, depression, and HTN but not on any medications presents to [facility] ED on 12/30 with SAH.   On 12/30 patient complaining of severe headache and called EMS.  On their arrival patient combative and verbally abusive.  Multiple open alcohol bottles on scene.  She stated multiple times that she wanted to go home and kill herself.  Patient brought to [facility] ED.  On arrival patient remained agitated and required Haldol  and soft restraints.  Initial BP 167/102.  Patient protecting airway on room air.  Afebrile.  CT head showing common aneurysm with small bilateral frontal SAH.  Neuro consulted and recommended CTA head.  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 NSG & NIR consulted and underwent coil embolization. Started on Cleviprex , now off. PCCM transferred care over to [facility] and progressive unit on 1/8.  Psychiatrist to please note that if patient is discharged from [facility] prior to 06/25/2023, she will need prescriptions for medications that have not run their course i.e. Nimodipine , and thiamine .  Haldol  was left on the discharge meds to [facility] due to agitation this morning, further management of this is deferred to psychiatry.  Also recommend TOC consult at [facility] to assist with PCP to follow as outpatient and resources for prescription meds if any needed.     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 Lanis in a few weeks. Continue Nimodipine  to complete 21 days therapy.  After discussing with PCCM on 1/8, changed Nimodipine  to 60 mg 4 times daily to DC after 06/25/2023 doses.  Also per Dr. Harold, Decadron  was being used for headache and was okay to stop Decadron  if no further headaches. Patient reports no headache for a couple days now and has required infrequent doses of tramadol  due to back pain and not headache. Decadron  DC'ed at DC. Maintain euvolemia. Ambulating without difficulty. Discontinued trazodone  and added melatonin at bedtime for insomnia which seems to have worked better last  night.   Hyponatremia, resolved   Polysubstance abuse Alcohol use disorder No signs of withdrawal Counseling provided regarding quit substances Continue thiamine .   Normocytic anemia Stable.   Depression/homicidal ideation Appreciate psychiatry follow-up 1/9 Earlier on admission, patient was IVC with and had a safety sitter/suicide precautions 1/9, psychiatry evaluated patient, patient unhappy with recommended  disposition (referred to psychiatry notes for details), became agitated, security/[employer] activated, had to be given IM Haldol .  Thereafter psychiatry recommends continuing IVC and transferred to [facility] for further evaluation and management. Per psychiatry, primary diagnosis: Delirium/alcohol withdrawal/substance-induced psychosis, mood disorder/history of schizoaffective disorder, bipolar type. EKG 1/9 personally reviewed.  QTc 420 ms.   Body mass index is 20.15 kg/m.    Consultants:   Psychiatry Interventional radiology Neurosurgery PCCM   Procedures:   As noted above.     Discharge Instructions  Discharge Instructions     Activity as tolerated - No restrictions   Complete by: As directed    Call MD for:   Complete by: As directed    Recurrent suicidal ideations.   Call MD for:  difficulty breathing, headache or visual disturbances   Complete by: As directed    Call MD for:  extreme fatigue   Complete by: As directed    Call MD for:  persistant dizziness or light-headedness   Complete by: As directed    Call MD for:  persistant nausea and vomiting   Complete by: As directed    Call MD for:  severe uncontrolled pain   Complete by: As directed    Call MD for:  temperature >100.4   Complete by: As directed    Diet general   Complete by: As directed    No wound care   Complete by: As directed         Medication List     STOP taking these medications    ibuprofen  200 MG tablet Commonly known as: ADVIL    ibuprofen  600 MG tablet Commonly known as: ADVIL    methocarbamol  500 MG tablet Commonly known as: ROBAXIN    traZODone  50 MG tablet Commonly known as: DESYREL        TAKE these medications    acetaminophen  325 MG tablet Commonly known as: TYLENOL  Take 2 tablets (650 mg total) by mouth every 4 (four) hours as needed for mild pain (pain score 1-3) or moderate pain (pain score 4-6) (temp > 101.5).   haloperidol  5 MG tablet Commonly known as: HALDOL  Take 1  tablet (5 mg total) by mouth every 8 (eight) hours as needed for agitation.   haloperidol  lactate 5 MG/ML injection Commonly known as: HALDOL  Inject 1 mL (5 mg total) into the muscle every 8 (eight) hours as needed (agitation).   melatonin 3 MG Tabs tablet Take 1 tablet (3 mg total) by mouth at bedtime.   multivitamin with minerals Tabs tablet Take 1 tablet by mouth daily. Start taking on: June 14, 2023   nicotine  7 mg/24hr patch Commonly known as: NICODERM CQ  - dosed in mg/24 hr Place 1 patch (7 mg total) onto the skin daily for 21 days. Start taking on: June 14, 2023   niMODipine  30 MG capsule Commonly known as: NIMOTOP  Take 2 capsules (60 mg total) by mouth 4 (four) times daily for 12 days.   polyethylene glycol 17 g packet Commonly known as: MIRALAX  / GLYCOLAX  Take 17 g by mouth daily. Start taking on: June 14, 2023   risperiDONE  2 MG tablet Commonly known as: RISPERDAL  Take 1 tablet (2  mg total) by mouth at bedtime.   senna-docusate 8.6-50 MG tablet Commonly known as: Senokot-S Take 1 tablet by mouth 2 (two) times daily.   thiamine  100 MG tablet Commonly known as: Vitamin B-1 Take 1 tablet (100 mg total) by mouth daily. Start taking on: June 14, 2023   traMADol  50 MG tablet Commonly known as: ULTRAM  Take 1 tablet (50 mg total) by mouth every 8 (eight) hours as needed for up to 3 days for severe pain (pain score 7-10).       Allergies  Allergen Reactions   Shellfish Allergy Anaphylaxis   Pollen Extract    Aspirin Palpitations      Procedures/Studies: VAS US  TRANSCRANIAL DOPPLER Result Date: 06/11/2023  Transcranial Doppler Patient Name:  Tiffany [name]  Date of Exam:   06/10/2023 Medical Rec #: [id number]         Accession #:    [id number] Date of Birth: [birth year]          Patient Gender: F Patient Age:   3 years Exam Location:  [facility] Procedure:      VAS US  TRANSCRANIAL DOPPLER Referring Phys: CURTIS DE MACEDO RODRIGUES  --------------------------------------------------------------------------------  Indications: Subarachnoid hemorrhage. Performing Technologist: Ricka Holland RDMS, RVT  Examination Guidelines: A complete evaluation includes B-mode imaging, spectral Doppler, color Doppler, and power Doppler as needed of all accessible portions of each vessel. Bilateral testing is considered an integral part of a complete examination. Limited examinations for reoccurring indications may be performed as noted.  +----------+---------------+----------+-----------+-------------+ RIGHT TCD Right VM (cm/s)Depth (cm)Pulsatility   Comment    +----------+---------------+----------+-----------+-------------+ MCA             103                   1.05                  +----------+---------------+----------+-----------+-------------+ ACA             -62                   0.82                  +----------+---------------+----------+-----------+-------------+ Term ICA        93                    0.93                  +----------+---------------+----------+-----------+-------------+ PCA P1          49                    0.95                  +----------+---------------+----------+-----------+-------------+ Opthalmic       18                    0.95                  +----------+---------------+----------+-----------+-------------+ ICA siphon                                    NOT INSONATED +----------+---------------+----------+-----------+-------------+ Vertebral       -30                   1.11                  +----------+---------------+----------+-----------+-------------+  Distal ICA      41                                          +----------+---------------+----------+-----------+-------------+  +----------+--------------+----------+-----------+-------+ LEFT TCD  Left VM (cm/s)Depth (cm)PulsatilityComment +----------+--------------+----------+-----------+-------+ MCA             110                   1.07            +----------+--------------+----------+-----------+-------+ ACA            -62                   1.10            +----------+--------------+----------+-----------+-------+ Term ICA        67                   0.78            +----------+--------------+----------+-----------+-------+ PCA P1          16                   1.07            +----------+--------------+----------+-----------+-------+ Opthalmic       19                   1.43            +----------+--------------+----------+-----------+-------+ ICA siphon      54                   0.69            +----------+--------------+----------+-----------+-------+ Vertebral      -33                   1.01            +----------+--------------+----------+-----------+-------+ Distal ICA      35                                   +----------+--------------+----------+-----------+-------+  +------------+-------+-------+             VM cm/sComment +------------+-------+-------+ Dist Basilar  -30          +------------+-------+-------+ +----------------------+---+ Right Lindegaard Ratio2.5 +----------------------+---+ +---------------------+---+ Left Lindegaard Ratio3.1 +---------------------+---+  Summary:  Elevated bilateral middle cerebral aretry mean flow velocities suggets mild vasospasm. *See table(s) above for TCD measurements and observations.  Diagnosing physician: Eather Popp MD Electronically signed by Eather Popp MD on 06/11/2023 at 4:45:54 PM.    Final    VAS US  TRANSCRANIAL DOPPLER Result Date: 06/11/2023  Transcranial Doppler Patient Name:  Tiffany [name]  Date of Exam:   06/07/2023 Medical Rec #: [id number]         Accession #:    [id number] Date of Birth: May 02, 1986          Patient Gender: F Patient Age:   27 years Exam Location:  [facility] Procedure:      VAS US  TRANSCRANIAL DOPPLER Referring Phys: CURTIS DE MACEDO RODRIGUES  --------------------------------------------------------------------------------  Indications: Subarachnoid hemorrhage. 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 Primary coil embolization performed.  Performing Technologist: Ricka Holland RDMS, RVT  Examination Guidelines: A complete evaluation includes B-mode imaging, spectral  Doppler, color Doppler, and power Doppler as needed of all accessible portions of each vessel. Bilateral testing is considered an integral part of a complete examination. Limited examinations for reoccurring indications may be performed as noted.  +----------+---------------+----------+-----------+-------------+ RIGHT TCD Right VM (cm/s)Depth (cm)Pulsatility   Comment    +----------+---------------+----------+-----------+-------------+ MCA             75                                          +----------+---------------+----------+-----------+-------------+ ACA             -99         6.30      0.65                  +----------+---------------+----------+-----------+-------------+ Term ICA        54                    1.00                  +----------+---------------+----------+-----------+-------------+ PCA P1          35                    0.55                  +----------+---------------+----------+-----------+-------------+ Opthalmic       15                    0.69                  +----------+---------------+----------+-----------+-------------+ ICA siphon                                    not insonated +----------+---------------+----------+-----------+-------------+ Vertebral       -35                   0.90                  +----------+---------------+----------+-----------+-------------+ Distal ICA      33                                          +----------+---------------+----------+-----------+-------------+  +----------+--------------+----------+-----------+-------------+ LEFT TCD  Left VM  (cm/s)Depth (cm)Pulsatility   Comment    +----------+--------------+----------+-----------+-------------+ MCA            106                   0.87                  +----------+--------------+----------+-----------+-------------+ ACA            -45                   0.93                  +----------+--------------+----------+-----------+-------------+ Term ICA        51                   0.85                  +----------+--------------+----------+-----------+-------------+ PCA P1  20                   0.91                  +----------+--------------+----------+-----------+-------------+ Opthalmic       20                   1.26                  +----------+--------------+----------+-----------+-------------+ ICA siphon                                   not insonated +----------+--------------+----------+-----------+-------------+ Vertebral      -24                   1.21                  +----------+--------------+----------+-----------+-------------+ Distal ICA      40                                         +----------+--------------+----------+-----------+-------------+  +------------+-------+-------+             VM cm/sComment +------------+-------+-------+ Prox Basilar  -40          +------------+-------+-------+ Dist Basilar  -57          +------------+-------+-------+ +----------------------+---+ Right Lindegaard Ratio2.2 +----------------------+---+ +---------------------+----+ Left Lindegaard Ratio2.65 +---------------------+----+  Summary:  Elevated right anterior cerebral and left midle cerebral artery mean flow velocities suggest mild vasospasm. *See table(s) above for TCD measurements and observations.  Diagnosing physician: Eather Popp MD Electronically signed by Eather Popp MD on 06/11/2023 at 4:45:05 PM.    Final    ECHOCARDIOGRAM COMPLETE Result Date: 06/07/2023    ECHOCARDIOGRAM REPORT   Patient Name:   DENISE WASHBURN  Date of Exam: 06/07/2023 Medical Rec #:  [id number]        Height:       66.0 in Accession #:    [id number]       Weight:       129.9 lb Date of Birth:  January 09, 1986         BSA:          1.664 m Patient Age:    37 years         BP:           111/66 mmHg Patient Gender: F                HR:           66 bpm. Exam Location:  Inpatient Procedure: 2D Echo, Cardiac Doppler and Color Doppler Indications:    Stroke I63.9  History:        Patient has no prior history of Echocardiogram examinations.  Sonographer:    Tinnie Gosling RDCS Referring Phys: [id number] SUDHAM CHAND IMPRESSIONS  1. Left ventricular ejection fraction, by estimation, is 60 to 65%. The left ventricle has normal function. The left ventricle has no regional wall motion abnormalities. Left ventricular diastolic parameters were normal.  2. Right ventricular systolic function is normal. The right ventricular size is mildly enlarged.  3. The mitral valve is normal in structure. No evidence of mitral valve regurgitation. No evidence of mitral stenosis.  4. The aortic valve is normal in structure. Aortic valve regurgitation is  not visualized. No aortic stenosis is present.  5. The inferior vena cava is normal in size with greater than 50% respiratory variability, suggesting right atrial pressure of 3 mmHg. FINDINGS  Left Ventricle: Left ventricular ejection fraction, by estimation, is 60 to 65%. The left ventricle has normal function. The left ventricle has no regional wall motion abnormalities. The left ventricular internal cavity size was normal in size. There is  no left ventricular hypertrophy. Left ventricular diastolic parameters were normal. Right Ventricle: The right ventricular size is mildly enlarged. No increase in right ventricular wall thickness. Right ventricular systolic function is normal. Left Atrium: Left atrial size was normal in size. Right Atrium: Right atrial size was normal in size. Pericardium: There is no evidence of pericardial effusion.  Mitral Valve: The mitral valve is normal in structure. No evidence of mitral valve regurgitation. No evidence of mitral valve stenosis. Tricuspid Valve: The tricuspid valve is normal in structure. Tricuspid valve regurgitation is not demonstrated. No evidence of tricuspid stenosis. Aortic Valve: The aortic valve is normal in structure. There is mild aortic valve annular calcification. Aortic valve regurgitation is not visualized. No aortic stenosis is present. Pulmonic Valve: The pulmonic valve was normal in structure. Pulmonic valve regurgitation is not visualized. No evidence of pulmonic stenosis. Aorta: The aortic root is normal in size and structure. Venous: The inferior vena cava is normal in size with greater than 50% respiratory variability, suggesting right atrial pressure of 3 mmHg. IAS/Shunts: The interatrial septum was not well visualized.  LEFT VENTRICLE PLAX 2D LVIDd:         4.40 cm   Diastology LVIDs:         3.10 cm   LV e' medial:    11.30 cm/s LV PW:         0.60 cm   LV E/e' medial:  5.1 LV IVS:        0.60 cm   LV e' lateral:   12.90 cm/s LVOT diam:     1.90 cm   LV E/e' lateral: 4.5 LV SV:         51 LV SV Index:   31 LVOT Area:     2.84 cm  RIGHT VENTRICLE             IVC RV S prime:     11.10 cm/s  IVC diam: 0.70 cm TAPSE (M-mode): 1.6 cm LEFT ATRIUM           Index        RIGHT ATRIUM          Index LA diam:      2.10 cm 1.26 cm/m   RA Area:     9.64 cm LA Vol (A4C): 24.3 ml 14.60 ml/m  RA Volume:   18.00 ml 10.81 ml/m  AORTIC VALVE LVOT Vmax:   117.00 cm/s LVOT Vmean:  81.300 cm/s LVOT VTI:    0.180 m  AORTA Ao Root diam: 2.70 cm MITRAL VALVE MV Area (PHT): 1.99 cm    SHUNTS MV Decel Time: 382 msec    Systemic VTI:  0.18 m MV E velocity: 57.50 cm/s  Systemic Diam: 1.90 cm MV A velocity: 46.70 cm/s MV E/A ratio:  1.23 Aditya Sabharwal Electronically signed by Ria Commander Signature Date/Time: 06/07/2023/4:44:06 PM    Final    VAS US  TRANSCRANIAL DOPPLER Result Date: 06/07/2023   Transcranial Doppler Patient Name:  Tiffany [name]  Date of Exam:   06/06/2023 Medical Rec #: [id number]  Accession #:    [id number] Date of Birth: 06/12/1985          Patient Gender: F Patient Age:   81 years Exam [facility] Procedure:      VAS US  TRANSCRANIAL DOPPLER Referring Phys: CURTIS DE MACEDO RODRIGUES --------------------------------------------------------------------------------  Indications: Subarachnoid hemorrhage. 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 Primary coil embolization performed.  Performing Technologist: Ricka Holland RDMS, RVT  Examination Guidelines: A complete evaluation includes B-mode imaging, spectral Doppler, color Doppler, and power Doppler as needed of all accessible portions of each vessel. Bilateral testing is considered an integral part of a complete examination. Limited examinations for reoccurring indications may be performed as noted.  +----------+---------------+----------+-----------+-------------+ RIGHT TCD Right VM (cm/s)Depth (cm)Pulsatility   Comment    +----------+---------------+----------+-----------+-------------+ MCA             87                    0.96                  +----------+---------------+----------+-----------+-------------+ ACA            -101         5.70      0.75                  +----------+---------------+----------+-----------+-------------+ Term ICA        63                    0.99                  +----------+---------------+----------+-----------+-------------+ PCA P1          39                    0.91                  +----------+---------------+----------+-----------+-------------+ Opthalmic       20                    1.06                  +----------+---------------+----------+-----------+-------------+ ICA siphon                                    not insonated  +----------+---------------+----------+-----------+-------------+ Vertebral       58                    0.78                  +----------+---------------+----------+-----------+-------------+ Distal ICA      39                                          +----------+---------------+----------+-----------+-------------+  +----------+--------------+----------+-----------+-------------+ LEFT TCD  Left VM (cm/s)Depth (cm)Pulsatility   Comment    +----------+--------------+----------+-----------+-------------+ MCA            107         4.60      0.92                  +----------+--------------+----------+-----------+-------------+ ACA            -59  1.04                  +----------+--------------+----------+-----------+-------------+ Term ICA        63                   0.93                  +----------+--------------+----------+-----------+-------------+ PCA P1          22                   0.93                  +----------+--------------+----------+-----------+-------------+ Opthalmic       31                   0.99                  +----------+--------------+----------+-----------+-------------+ ICA siphon                                   not insonated +----------+--------------+----------+-----------+-------------+ Vertebral      -45                   0.94                  +----------+--------------+----------+-----------+-------------+ Distal ICA      40                                         +----------+--------------+----------+-----------+-------------+  +------------+-------+-------+             VM cm/sComment +------------+-------+-------+ Prox Basilar  -73          +------------+-------+-------+ Dist Basilar  -80          +------------+-------+-------+ +----------------------+---+ Right Lindegaard Ratio2.2 +----------------------+---+ +---------------------+---+ Left Lindegaard Ratio2.6  +---------------------+---+  Summary:  Elevated mean flow velocities in left middle cerebral, right anterior cerebral and distal basilar arteries suggestive of mild vasospasm.Mildly elevated right middle cerebral artery mean flow velocities of unclear significance *See table(s) above for TCD measurements and observations.  Diagnosing physician: Eather Popp MD Electronically signed by Eather Popp MD on 06/07/2023 at 12:03:05 PM.    Final    IR US  Guide Vasc Access Right Result Date: 06/06/2023 INDICATION: Jarelly Rinck is a 38 year old female whole was brought by EMS to the ED on 06/03/2023 complaining of headache. Her past medical history significant for polysubstance abuse (marijuana, cocaine), alcohol abuse, hypertension, depression, bipolar disorder and schizophrenia. Head CT showed basal cisterns and frontal interhemispheric subarachnoid hemorrhage (modified Fisher 1). 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 She was started on Cleviprex  for blood pressure control. She comes to our service for a diagnostic cerebral angiogram and possible endovascular aneurysm embolization. EXAM: ULTRASOUND-GUIDED VASCULAR ACCESS DIAGNOSTIC CEREBRAL ANGIOGRAM ENDOVASCULAR ANEURYSM EMBOLIZATION FLAT PANEL HEAD CT COMPARISON:  CT/CT angiogram of the head and neck June 03, 2023. MEDICATIONS: 2 g of Ancef  were administered intravenously due to closure device protruding through skin puncture site in the right groin. ANESTHESIA/SEDATION: The procedure was performed under general anesthesia. CONTRAST:  100 mL of Omnipaque  300 milligram/mL FLUOROSCOPY: Radiation Exposure Index (as provided by the fluoroscopic device): 2,180 mGy Kerma COMPLICATIONS: None immediate. TECHNIQUE: Patient unable to consent for herself due to  sedation. No healthcare proxy or next of kin available for consent. Decision made to proceed with intervention in the best interest of the patient, as discussed with  the emergency and neuro surgery attending physicians. Maximal Sterile Barrier Technique was utilized including caps, mask, sterile gowns, sterile gloves, sterile drape, hand hygiene and skin antiseptic. A timeout was performed prior to the initiation of the procedure. The right groin was prepped and draped in the usual sterile fashion. Using a micropuncture kit and the modified Seldinger technique, access was gained to the right common femoral artery and an 8 French sheath was placed. Real-time ultrasound guidance was utilized for vascular access including the acquisition of a permanent ultrasound image documenting patency of the accessed vessel. Under fluoroscopy, a 5 French Berenstein 2 catheter was navigated over a 0.035 Terumo Glidewire into the aortic arch. The catheter was placed into the left subclavian artery and then advanced into the left vertebral artery. Frontal and lateral angiograms of the head were obtained. The catheter was subsequently placed into the right subclavian artery and then advanced into the right vertebral artery. Frontal and lateral angiograms of the head were obtained. Next, the catheter was placed into right common carotid artery. Frontal and lateral angiograms of the neck were obtained. Using biplane roadmap guidance, the catheter was placed into the right internal carotid artery. Angiograms with frontal, lateral, magnified right anterior oblique and magnified lateral views of the head were obtained. The catheter was placed into the left common carotid artery. Frontal and lateral angiograms of the head were obtained. Using biplane roadmap guidance, the catheter was placed into the left internal carotid. Angiograms with frontal and lateral views of the head were obtained. 3D rotational angiograms were acquired and post processed in a separate workstation under concurrent attending physician supervision. Selected images were sent to PACS. Additionally, magnified left anterior oblique  and magnified lateral angiograms of the head were obtained, centered on the anterior communicating artery. FINDINGS: Left vertebral artery angiograms: The left vertebral artery, basilar artery, and bilateral posterior cerebral arteries are unremarkable. Luminal caliber is smooth and tapering. No aneurysms or abnormally high-flow, early draining veins are seen. No regions of abnormal hypervascularity are noted. The visualized dural sinuses are patent. Right vertebral artery angiograms: The right vertebral artery, basilar artery, and bilateral posterior cerebral arteries are unremarkable. Luminal caliber is smooth and tapering. No aneurysms or abnormally high-flow, early draining veins are seen. No regions of abnormal hypervascularity are noted. The visualized dural sinuses are patent. Right CCA angiograms: Cervical angiograms show normal course and caliber of the visualized right common carotid and internal carotid arteries. There are no significant stenoses. Right ICA angiograms: There is brisk vascular contrast filling of the right ACA and MCA vascular trees. Luminal caliber is smooth and tapering. No aneurysms or abnormally high-flow, early draining veins are seen. No regions of abnormal hypervascularity are noted. The visualized dural sinuses are patent. Left CCA angiograms: Cervical angiograms show normal course and caliber of the visualized left common carotid and internal carotid arteries. There are no significant stenoses. Left ICA angiograms: There is brisk vascular contrast filling of the left ACA and MCA vascular trees. 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 The aneurysm measures 5 x 3 x 2.7 mm. There is early left A2/ACA bifurcation. 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 Luminal caliber is smooth and tapering. No abnormally high-flow, early draining veins are seen. No  regions of  abnormal hypervascularity are noted. The visualized dural sinuses are patent. Right common femoral artery ultrasound evaluation: The femoral artery has normal caliber, adequate for vascular access. PROCEDURE: The Berenstein 2 catheter was exchanged over the wire and under biplane roadmap guidance for a serum beta guide catheter which was placed in the proximal cervical left ICA. Magnified frontal and lateral angiograms of the head were obtained, in the working projections. Using tri-axial technique, navigation of a 6 French Sofia intermediate catheter was performed over an SL 10 microcatheter and Aristotle 14 microguidewire through the guide catheter into the cavernous segment of the left ICA. The microcatheter was then advanced over the wire into the left A1/ACA segment and subsequently into the anterior communicating artery aneurysm pouch. Then, a 2.5 mm x 4.5 cm target tetra coil was placed within the aneurysm pouch under fluoroscopic guidance. Control angiograms were obtained. Neck is, a 2 mm x 3.5 cm target tetra coil was placed within the aneurysm pouch under fluoroscopic guidance. Control angiograms were obtained. Finally, a 1.5 mm x 2 cm target tetra coil was placed within the aneurysm pouch under fluoroscopic guidance. Control angiograms were obtained showing complete aneurysm occlusion with preservation of the parent artery. Left internal carotid artery angiograms with frontal lateral views of the entire head showed no evidence of thromboembolic complication. Flat panel CT of the head was obtained and post processed in a separate workstation with concurrent attending physician supervision. Selected images were sent to PACS. Similar appearance of known subarachnoid hemorrhage with no evidence of recurrent bleed. 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 The catheter was  subsequently withdrawn. Right common femoral artery angiogram was obtained in a. The puncture is at the level of the common femoral artery. The artery has normal caliber, adequate for closure device. The sheath was exchanged over the wire for a Perclose prostyle which was utilized for access closure. Immediate hemostasis was achieved. IMPRESSION: 1. 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 No other aneurysm identified. 2. Primary coil embolization performed with complete aneurysm occlusion. No evidence of complication. PLAN: Follow-up MR angiogram in 6 months to evaluate for treatment outcome. Electronically Signed   By: Katyucia  de Macedo Rodrigues M.D.   On: 06/06/2023 12:40   IR ANGIO INTRA EXTRACRAN SEL INTERNAL CAROTID BILAT MOD SED Result Date: 06/06/2023 INDICATION: Tiffany [name] is a 38 year old female whole was brought by EMS to the ED on 06/03/2023 complaining of headache. Her past medical history significant for polysubstance abuse (marijuana, cocaine), alcohol abuse, hypertension, depression, bipolar disorder and schizophrenia. Head CT showed basal cisterns and frontal interhemispheric subarachnoid hemorrhage (modified Fisher 1). 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 She was started on Cleviprex  for blood pressure control. She comes to our service for a diagnostic cerebral angiogram and possible endovascular aneurysm embolization. EXAM: ULTRASOUND-GUIDED VASCULAR ACCESS DIAGNOSTIC CEREBRAL ANGIOGRAM ENDOVASCULAR ANEURYSM EMBOLIZATION FLAT PANEL HEAD CT COMPARISON:  CT/CT angiogram of the head and neck June 03, 2023. MEDICATIONS: 2 g of Ancef  were administered intravenously due to closure device protruding through skin puncture site in the right groin. ANESTHESIA/SEDATION: The procedure was performed under general anesthesia. CONTRAST:  100 mL of Omnipaque  300 milligram/mL  FLUOROSCOPY: Radiation Exposure Index (as provided by the fluoroscopic device): 2,180 mGy Kerma COMPLICATIONS: None immediate. TECHNIQUE: Patient unable to consent  for herself due to sedation. No healthcare proxy or next of kin available for consent. Decision made to proceed with intervention in the best interest of the patient, as discussed with the emergency and neuro surgery attending physicians. Maximal Sterile Barrier Technique was utilized including caps, mask, sterile gowns, sterile gloves, sterile drape, hand hygiene and skin antiseptic. A timeout was performed prior to the initiation of the procedure. The right groin was prepped and draped in the usual sterile fashion. Using a micropuncture kit and the modified Seldinger technique, access was gained to the right common femoral artery and an 8 French sheath was placed. Real-time ultrasound guidance was utilized for vascular access including the acquisition of a permanent ultrasound image documenting patency of the accessed vessel. Under fluoroscopy, a 5 French Berenstein 2 catheter was navigated over a 0.035 Terumo Glidewire into the aortic arch. The catheter was placed into the left subclavian artery and then advanced into the left vertebral artery. Frontal and lateral angiograms of the head were obtained. The catheter was subsequently placed into the right subclavian artery and then advanced into the right vertebral artery. Frontal and lateral angiograms of the head were obtained. Next, the catheter was placed into right common carotid artery. Frontal and lateral angiograms of the neck were obtained. Using biplane roadmap guidance, the catheter was placed into the right internal carotid artery. Angiograms with frontal, lateral, magnified right anterior oblique and magnified lateral views of the head were obtained. The catheter was placed into the left common carotid artery. Frontal and lateral angiograms of the head were obtained. Using biplane roadmap  guidance, the catheter was placed into the left internal carotid. Angiograms with frontal and lateral views of the head were obtained. 3D rotational angiograms were acquired and post processed in a separate workstation under concurrent attending physician supervision. Selected images were sent to PACS. Additionally, magnified left anterior oblique and magnified lateral angiograms of the head were obtained, centered on the anterior communicating artery. FINDINGS: Left vertebral artery angiograms: The left vertebral artery, basilar artery, and bilateral posterior cerebral arteries are unremarkable. Luminal caliber is smooth and tapering. No aneurysms or abnormally high-flow, early draining veins are seen. No regions of abnormal hypervascularity are noted. The visualized dural sinuses are patent. Right vertebral artery angiograms: The right vertebral artery, basilar artery, and bilateral posterior cerebral arteries are unremarkable. Luminal caliber is smooth and tapering. No aneurysms or abnormally high-flow, early draining veins are seen. No regions of abnormal hypervascularity are noted. The visualized dural sinuses are patent. Right CCA angiograms: Cervical angiograms show normal course and caliber of the visualized right common carotid and internal carotid arteries. There are no significant stenoses. Right ICA angiograms: There is brisk vascular contrast filling of the right ACA and MCA vascular trees. Luminal caliber is smooth and tapering. No aneurysms or abnormally high-flow, early draining veins are seen. No regions of abnormal hypervascularity are noted. The visualized dural sinuses are patent. Left CCA angiograms: Cervical angiograms show normal course and caliber of the visualized left common carotid and internal carotid arteries. There are no significant stenoses. Left ICA angiograms: There is brisk vascular contrast filling of the left ACA and MCA vascular trees. A left laterally projecting saccular  aneurysm is seen at left A1-A2 junction, corresponding to aneurysm described recent CT angiogram. The aneurysm measures 5 x 3 x 2.7 mm. There is early left A2/ACA bifurcation. 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 Luminal caliber is smooth and tapering. No abnormally high-flow, early draining  veins are seen. No regions of abnormal hypervascularity are noted. The visualized dural sinuses are patent. Right common femoral artery ultrasound evaluation: The femoral artery has normal caliber, adequate for vascular access. PROCEDURE: The Berenstein 2 catheter was exchanged over the wire and under biplane roadmap guidance for a serum beta guide catheter which was placed in the proximal cervical left ICA. Magnified frontal and lateral angiograms of the head were obtained, in the working projections. Using tri-axial technique, navigation of a 6 French Sofia intermediate catheter was performed over an SL 10 microcatheter and Aristotle 14 microguidewire through the guide catheter into the cavernous segment of the left ICA. The microcatheter was then advanced over the wire into the left A1/ACA segment and subsequently into the anterior communicating artery aneurysm pouch. Then, a 2.5 mm x 4.5 cm target tetra coil was placed within the aneurysm pouch under fluoroscopic guidance. Control angiograms were obtained. Neck is, a 2 mm x 3.5 cm target tetra coil was placed within the aneurysm pouch under fluoroscopic guidance. Control angiograms were obtained. Finally, a 1.5 mm x 2 cm target tetra coil was placed within the aneurysm pouch under fluoroscopic guidance. Control angiograms were obtained showing complete aneurysm occlusion with preservation of the parent artery. Left internal carotid artery angiograms with frontal lateral views of the entire head showed no evidence of thromboembolic complication. Flat panel CT of the head was obtained and post processed in a separate workstation with concurrent  attending physician supervision. Selected images were sent to PACS. Similar appearance of known subarachnoid hemorrhage with no evidence of recurrent bleed. 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 The catheter was subsequently withdrawn. Right common femoral artery angiogram was obtained in a. The puncture is at the level of the common femoral artery. The artery has normal caliber, adequate for closure device. The sheath was exchanged over the wire for a Perclose prostyle which was utilized for access closure. Immediate hemostasis was achieved. IMPRESSION: 1. 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 No other aneurysm identified. 2. Primary coil embolization performed with complete aneurysm occlusion. No evidence of complication. PLAN: Follow-up MR angiogram in 6 months to evaluate for treatment outcome. Electronically Signed   By: Katyucia  de Macedo Rodrigues M.D.   On: 06/06/2023 12:40   IR ANGIO VERTEBRAL SEL VERTEBRAL BILAT MOD SED Result Date: 06/06/2023 INDICATION: Leanore Biggers is a 38 year old female whole was brought by EMS to the ED on 06/03/2023 complaining of headache. Her past medical history significant for polysubstance abuse (marijuana, cocaine), alcohol abuse, hypertension, depression, bipolar disorder and schizophrenia. Head CT showed basal cisterns and frontal interhemispheric subarachnoid hemorrhage (modified Fisher 1). 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 She was started on Cleviprex  for blood pressure control. She comes to our service for a diagnostic cerebral angiogram and possible endovascular aneurysm embolization. EXAM: ULTRASOUND-GUIDED VASCULAR ACCESS DIAGNOSTIC CEREBRAL ANGIOGRAM ENDOVASCULAR ANEURYSM EMBOLIZATION FLAT PANEL  HEAD CT COMPARISON:  CT/CT angiogram of the head and neck June 03, 2023. MEDICATIONS: 2 g of Ancef  were administered intravenously due to closure device protruding through skin puncture site in the right groin. ANESTHESIA/SEDATION: The procedure was performed under general anesthesia. CONTRAST:  100 mL of Omnipaque  300 milligram/mL FLUOROSCOPY: Radiation Exposure Index (as provided by the fluoroscopic device): 2,180 mGy Kerma COMPLICATIONS: None immediate. TECHNIQUE: Patient unable to  consent for herself due to sedation. No healthcare proxy or next of kin available for consent. Decision made to proceed with intervention in the best interest of the patient, as discussed with the emergency and neuro surgery attending physicians. Maximal Sterile Barrier Technique was utilized including caps, mask, sterile gowns, sterile gloves, sterile drape, hand hygiene and skin antiseptic. A timeout was performed prior to the initiation of the procedure. The right groin was prepped and draped in the usual sterile fashion. Using a micropuncture kit and the modified Seldinger technique, access was gained to the right common femoral artery and an 8 French sheath was placed. Real-time ultrasound guidance was utilized for vascular access including the acquisition of a permanent ultrasound image documenting patency of the accessed vessel. Under fluoroscopy, a 5 French Berenstein 2 catheter was navigated over a 0.035 Terumo Glidewire into the aortic arch. The catheter was placed into the left subclavian artery and then advanced into the left vertebral artery. Frontal and lateral angiograms of the head were obtained. The catheter was subsequently placed into the right subclavian artery and then advanced into the right vertebral artery. Frontal and lateral angiograms of the head were obtained. Next, the catheter was placed into right common carotid artery. Frontal and lateral angiograms of the neck were obtained. Using biplane roadmap  guidance, the catheter was placed into the right internal carotid artery. Angiograms with frontal, lateral, magnified right anterior oblique and magnified lateral views of the head were obtained. The catheter was placed into the left common carotid artery. Frontal and lateral angiograms of the head were obtained. Using biplane roadmap guidance, the catheter was placed into the left internal carotid. Angiograms with frontal and lateral views of the head were obtained. 3D rotational angiograms were acquired and post processed in a separate workstation under concurrent attending physician supervision. Selected images were sent to PACS. Additionally, magnified left anterior oblique and magnified lateral angiograms of the head were obtained, centered on the anterior communicating artery. FINDINGS: Left vertebral artery angiograms: The left vertebral artery, basilar artery, and bilateral posterior cerebral arteries are unremarkable. Luminal caliber is smooth and tapering. No aneurysms or abnormally high-flow, early draining veins are seen. No regions of abnormal hypervascularity are noted. The visualized dural sinuses are patent. Right vertebral artery angiograms: The right vertebral artery, basilar artery, and bilateral posterior cerebral arteries are unremarkable. Luminal caliber is smooth and tapering. No aneurysms or abnormally high-flow, early draining veins are seen. No regions of abnormal hypervascularity are noted. The visualized dural sinuses are patent. Right CCA angiograms: Cervical angiograms show normal course and caliber of the visualized right common carotid and internal carotid arteries. There are no significant stenoses. Right ICA angiograms: There is brisk vascular contrast filling of the right ACA and MCA vascular trees. Luminal caliber is smooth and tapering. No aneurysms or abnormally high-flow, early draining veins are seen. No regions of abnormal hypervascularity are noted. The visualized dural  sinuses are patent. Left CCA angiograms: Cervical angiograms show normal course and caliber of the visualized left common carotid and internal carotid arteries. There are no significant stenoses. Left ICA angiograms: There is brisk vascular contrast filling of the left ACA and MCA vascular trees. 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 The aneurysm measures 5 x 3 x 2.7 mm. There is early left A2/ACA bifurcation. 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 Luminal caliber is smooth and tapering. No abnormally high-flow, early  draining veins are seen. No regions of abnormal hypervascularity are noted. The visualized dural sinuses are patent. Right common femoral artery ultrasound evaluation: The femoral artery has normal caliber, adequate for vascular access. PROCEDURE: The Berenstein 2 catheter was exchanged over the wire and under biplane roadmap guidance for a serum beta guide catheter which was placed in the proximal cervical left ICA. Magnified frontal and lateral angiograms of the head were obtained, in the working projections. Using tri-axial technique, navigation of a 6 French Sofia intermediate catheter was performed over an SL 10 microcatheter and Aristotle 14 microguidewire through the guide catheter into the cavernous segment of the left ICA. The microcatheter was then advanced over the wire into the left A1/ACA segment and subsequently into the anterior communicating artery aneurysm pouch. Then, a 2.5 mm x 4.5 cm target tetra coil was placed within the aneurysm pouch under fluoroscopic guidance. Control angiograms were obtained. Neck is, a 2 mm x 3.5 cm target tetra coil was placed within the aneurysm pouch under fluoroscopic guidance. Control angiograms were obtained. Finally, a 1.5 mm x 2 cm target tetra coil was placed within the aneurysm pouch under fluoroscopic guidance. Control  angiograms were obtained showing complete aneurysm occlusion with preservation of the parent artery. Left internal carotid artery angiograms with frontal lateral views of the entire head showed no evidence of thromboembolic complication. Flat panel CT of the head was obtained and post processed in a separate workstation with concurrent attending physician supervision. Selected images were sent to PACS. Similar appearance of known subarachnoid hemorrhage with no evidence of recurrent bleed. 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 The catheter was subsequently withdrawn. Right common femoral artery angiogram was obtained in a. The puncture is at the level of the common femoral artery. The artery has normal caliber, adequate for closure device. The sheath was exchanged over the wire for a Perclose prostyle which was utilized for access closure. Immediate hemostasis was achieved. IMPRESSION: 1. 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 No other aneurysm identified. 2. Primary coil embolization performed with complete aneurysm occlusion. No evidence of complication. PLAN: Follow-up MR angiogram in 6 months to evaluate for treatment outcome. Electronically Signed   By: Katyucia  de Macedo Rodrigues M.D.   On: 06/06/2023 12:40   IR 3D Independent [place] Result Date: 06/06/2023 INDICATION: Tiffany [name] is a 38 year old female whole was brought by EMS to the ED on 06/03/2023 complaining of headache. Her past medical history significant for polysubstance abuse (marijuana, cocaine), alcohol abuse, hypertension, depression, bipolar disorder and schizophrenia. Head CT showed basal cisterns and frontal interhemispheric subarachnoid hemorrhage (modified Fisher 1). 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 She was started on Cleviprex  for blood pressure control. She comes to our service for a diagnostic cerebral angiogram and possible endovascular aneurysm embolization. EXAM: ULTRASOUND-GUIDED VASCULAR ACCESS DIAGNOSTIC CEREBRAL ANGIOGRAM ENDOVASCULAR ANEURYSM EMBOLIZATION FLAT PANEL HEAD CT COMPARISON:  CT/CT angiogram of the head and neck June 03, 2023. MEDICATIONS: 2 g of Ancef  were administered intravenously due to closure device protruding through skin puncture site in the right groin. ANESTHESIA/SEDATION: The procedure was performed under general anesthesia. CONTRAST:  100 mL of Omnipaque  300 milligram/mL FLUOROSCOPY: Radiation Exposure Index (as provided by the fluoroscopic device): 2,180 mGy Kerma COMPLICATIONS: None immediate. TECHNIQUE: Patient unable to consent for herself  due to sedation. No healthcare proxy or next of kin available for consent. Decision made to proceed with intervention in the best interest of the patient, as discussed with the emergency and neuro surgery attending physicians. Maximal Sterile Barrier Technique was utilized including caps, mask, sterile gowns, sterile gloves, sterile drape, hand hygiene and skin antiseptic. A timeout was performed prior to the initiation of the procedure. The right groin was prepped and draped in the usual sterile fashion. Using a micropuncture kit and the modified Seldinger technique, access was gained to the right common femoral artery and an 8 French sheath was placed. Real-time ultrasound guidance was utilized for vascular access including the acquisition of a permanent ultrasound image documenting patency of the accessed vessel. Under fluoroscopy, a 5 French Berenstein 2 catheter was navigated over a 0.035 Terumo Glidewire into the aortic arch. The catheter was placed into the left subclavian artery and then advanced into the left vertebral artery. Frontal and lateral angiograms of the head were obtained. The  catheter was subsequently placed into the right subclavian artery and then advanced into the right vertebral artery. Frontal and lateral angiograms of the head were obtained. Next, the catheter was placed into right common carotid artery. Frontal and lateral angiograms of the neck were obtained. Using biplane roadmap guidance, the catheter was placed into the right internal carotid artery. Angiograms with frontal, lateral, magnified right anterior oblique and magnified lateral views of the head were obtained. The catheter was placed into the left common carotid artery. Frontal and lateral angiograms of the head were obtained. Using biplane roadmap guidance, the catheter was placed into the left internal carotid. Angiograms with frontal and lateral views of the head were obtained. 3D rotational angiograms were acquired and post processed in a separate workstation under concurrent attending physician supervision. Selected images were sent to PACS. Additionally, magnified left anterior oblique and magnified lateral angiograms of the head were obtained, centered on the anterior communicating artery. FINDINGS: Left vertebral artery angiograms: The left vertebral artery, basilar artery, and bilateral posterior cerebral arteries are unremarkable. Luminal caliber is smooth and tapering. No aneurysms or abnormally high-flow, early draining veins are seen. No regions of abnormal hypervascularity are noted. The visualized dural sinuses are patent. Right vertebral artery angiograms: The right vertebral artery, basilar artery, and bilateral posterior cerebral arteries are unremarkable. Luminal caliber is smooth and tapering. No aneurysms or abnormally high-flow, early draining veins are seen. No regions of abnormal hypervascularity are noted. The visualized dural sinuses are patent. Right CCA angiograms: Cervical angiograms show normal course and caliber of the visualized right common carotid and internal carotid arteries. There  are no significant stenoses. Right ICA angiograms: There is brisk vascular contrast filling of the right ACA and MCA vascular trees. Luminal caliber is smooth and tapering. No aneurysms or abnormally high-flow, early draining veins are seen. No regions of abnormal hypervascularity are noted. The visualized dural sinuses are patent. Left CCA angiograms: Cervical angiograms show normal course and caliber of the visualized left common carotid and internal carotid arteries. There are no significant stenoses. Left ICA angiograms: There is brisk vascular contrast filling of the left ACA and MCA vascular trees. 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 The aneurysm measures 5 x 3 x 2.7 mm. There is early left A2/ACA bifurcation. 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 Luminal caliber is smooth and tapering. No abnormally high-flow, early draining veins are  seen. No regions of abnormal hypervascularity are noted. The visualized dural sinuses are patent. Right common femoral artery ultrasound evaluation: The femoral artery has normal caliber, adequate for vascular access. PROCEDURE: The Berenstein 2 catheter was exchanged over the wire and under biplane roadmap guidance for a serum beta guide catheter which was placed in the proximal cervical left ICA. Magnified frontal and lateral angiograms of the head were obtained, in the working projections. Using tri-axial technique, navigation of a 6 French Sofia intermediate catheter was performed over an SL 10 microcatheter and Aristotle 14 microguidewire through the guide catheter into the cavernous segment of the left ICA. The microcatheter was then advanced over the wire into the left A1/ACA segment and subsequently into the anterior communicating artery aneurysm pouch. Then, a 2.5 mm x 4.5 cm target tetra coil was placed within the aneurysm pouch under  fluoroscopic guidance. Control angiograms were obtained. Neck is, a 2 mm x 3.5 cm target tetra coil was placed within the aneurysm pouch under fluoroscopic guidance. Control angiograms were obtained. Finally, a 1.5 mm x 2 cm target tetra coil was placed within the aneurysm pouch under fluoroscopic guidance. Control angiograms were obtained showing complete aneurysm occlusion with preservation of the parent artery. Left internal carotid artery angiograms with frontal lateral views of the entire head showed no evidence of thromboembolic complication. Flat panel CT of the head was obtained and post processed in a separate workstation with concurrent attending physician supervision. Selected images were sent to PACS. Similar appearance of known subarachnoid hemorrhage with no evidence of recurrent bleed. 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 The catheter was subsequently withdrawn. Right common femoral artery angiogram was obtained in a. The puncture is at the level of the common femoral artery. The artery has normal caliber, adequate for closure device. The sheath was exchanged over the wire for a Perclose prostyle which was utilized for access closure. Immediate hemostasis was achieved. IMPRESSION: 1. 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 No other aneurysm identified. 2. Primary coil embolization performed with complete aneurysm occlusion. No evidence of complication. PLAN: Follow-up MR angiogram in 6 months to evaluate for treatment outcome. Electronically Signed   By: Katyucia  de Macedo Rodrigues M.D.   On: 06/06/2023 12:40   IR NEURO EACH ADD'L AFTER BASIC UNI LEFT (MS) Result Date: 06/06/2023 INDICATION: Charline Hoskinson is a 38 year old female whole was brought by EMS to the ED on 06/03/2023  complaining of headache. Her past medical history significant for polysubstance abuse (marijuana, cocaine), alcohol abuse, hypertension, depression, bipolar disorder and schizophrenia. Head CT showed basal cisterns and frontal interhemispheric subarachnoid hemorrhage (modified Fisher 1). 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 She was started on Cleviprex  for blood pressure control. She comes to our service for a diagnostic cerebral angiogram and possible endovascular aneurysm embolization. EXAM: ULTRASOUND-GUIDED VASCULAR ACCESS DIAGNOSTIC CEREBRAL ANGIOGRAM ENDOVASCULAR ANEURYSM EMBOLIZATION FLAT PANEL HEAD CT COMPARISON:  CT/CT angiogram of the head and neck June 03, 2023. MEDICATIONS: 2 g of Ancef  were administered intravenously due to closure device protruding through skin puncture site in the right groin. ANESTHESIA/SEDATION: The procedure was performed under general anesthesia. CONTRAST:  100 mL of Omnipaque  300 milligram/mL FLUOROSCOPY: Radiation Exposure Index (as provided by the fluoroscopic device): 2,180 mGy Kerma COMPLICATIONS: None immediate. TECHNIQUE: Patient unable to consent  for herself due to sedation. No healthcare proxy or next of kin available for consent. Decision made to proceed with intervention in the best interest of the patient, as discussed with the emergency and neuro surgery attending physicians. Maximal Sterile Barrier Technique was utilized including caps, mask, sterile gowns, sterile gloves, sterile drape, hand hygiene and skin antiseptic. A timeout was performed prior to the initiation of the procedure. The right groin was prepped and draped in the usual sterile fashion. Using a micropuncture kit and the modified Seldinger technique, access was gained to the right common femoral artery and an 8 French sheath was placed. Real-time ultrasound guidance was utilized for vascular access including the acquisition of a permanent  ultrasound image documenting patency of the accessed vessel. Under fluoroscopy, a 5 French Berenstein 2 catheter was navigated over a 0.035 Terumo Glidewire into the aortic arch. The catheter was placed into the left subclavian artery and then advanced into the left vertebral artery. Frontal and lateral angiograms of the head were obtained. The catheter was subsequently placed into the right subclavian artery and then advanced into the right vertebral artery. Frontal and lateral angiograms of the head were obtained. Next, the catheter was placed into right common carotid artery. Frontal and lateral angiograms of the neck were obtained. Using biplane roadmap guidance, the catheter was placed into the right internal carotid artery. Angiograms with frontal, lateral, magnified right anterior oblique and magnified lateral views of the head were obtained. The catheter was placed into the left common carotid artery. Frontal and lateral angiograms of the head were obtained. Using biplane roadmap guidance, the catheter was placed into the left internal carotid. Angiograms with frontal and lateral views of the head were obtained. 3D rotational angiograms were acquired and post processed in a separate workstation under concurrent attending physician supervision. Selected images were sent to PACS. Additionally, magnified left anterior oblique and magnified lateral angiograms of the head were obtained, centered on the anterior communicating artery. FINDINGS: Left vertebral artery angiograms: The left vertebral artery, basilar artery, and bilateral posterior cerebral arteries are unremarkable. Luminal caliber is smooth and tapering. No aneurysms or abnormally high-flow, early draining veins are seen. No regions of abnormal hypervascularity are noted. The visualized dural sinuses are patent. Right vertebral artery angiograms: The right vertebral artery, basilar artery, and bilateral posterior cerebral arteries are unremarkable.  Luminal caliber is smooth and tapering. No aneurysms or abnormally high-flow, early draining veins are seen. No regions of abnormal hypervascularity are noted. The visualized dural sinuses are patent. Right CCA angiograms: Cervical angiograms show normal course and caliber of the visualized right common carotid and internal carotid arteries. There are no significant stenoses. Right ICA angiograms: There is brisk vascular contrast filling of the right ACA and MCA vascular trees. Luminal caliber is smooth and tapering. No aneurysms or abnormally high-flow, early draining veins are seen. No regions of abnormal hypervascularity are noted. The visualized dural sinuses are patent. Left CCA angiograms: Cervical angiograms show normal course and caliber of the visualized left common carotid and internal carotid arteries. There are no significant stenoses. Left ICA angiograms: There is brisk vascular contrast filling of the left ACA and MCA vascular trees. 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 The aneurysm measures 5 x 3 x 2.7 mm. There is early left A2/ACA bifurcation. 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 Luminal caliber is smooth and tapering. No abnormally high-flow, early draining  veins are seen. No regions of abnormal hypervascularity are noted. The visualized dural sinuses are patent. Right common femoral artery ultrasound evaluation: The femoral artery has normal caliber, adequate for vascular access. PROCEDURE: The Berenstein 2 catheter was exchanged over the wire and under biplane roadmap guidance for a serum beta guide catheter which was placed in the proximal cervical left ICA. Magnified frontal and lateral angiograms of the head were obtained, in the working projections. Using tri-axial technique, navigation of a 6 French Sofia intermediate catheter was performed over an SL 10  microcatheter and Aristotle 14 microguidewire through the guide catheter into the cavernous segment of the left ICA. The microcatheter was then advanced over the wire into the left A1/ACA segment and subsequently into the anterior communicating artery aneurysm pouch. Then, a 2.5 mm x 4.5 cm target tetra coil was placed within the aneurysm pouch under fluoroscopic guidance. Control angiograms were obtained. Neck is, a 2 mm x 3.5 cm target tetra coil was placed within the aneurysm pouch under fluoroscopic guidance. Control angiograms were obtained. Finally, a 1.5 mm x 2 cm target tetra coil was placed within the aneurysm pouch under fluoroscopic guidance. Control angiograms were obtained showing complete aneurysm occlusion with preservation of the parent artery. Left internal carotid artery angiograms with frontal lateral views of the entire head showed no evidence of thromboembolic complication. Flat panel CT of the head was obtained and post processed in a separate workstation with concurrent attending physician supervision. Selected images were sent to PACS. Similar appearance of known subarachnoid hemorrhage with no evidence of recurrent bleed. 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 The catheter was subsequently withdrawn. Right common femoral artery angiogram was obtained in a. The puncture is at the level of the common femoral artery. The artery has normal caliber, adequate for closure device. The sheath was exchanged over the wire for a Perclose prostyle which was utilized for access closure. Immediate hemostasis was achieved. IMPRESSION: 1. 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 No other aneurysm identified. 2. Primary coil embolization performed with complete aneurysm occlusion. No evidence of  complication. PLAN: Follow-up MR angiogram in 6 months to evaluate for treatment outcome. Electronically Signed   By: Katyucia  de Macedo Rodrigues M.D.   On: 06/06/2023 12:40   IR Transcath/Emboliz Result Date: 06/06/2023 INDICATION: Sameena Artus is a 38 year old female whole was brought by EMS to the ED on 06/03/2023 complaining of headache. Her past medical history significant for polysubstance abuse (marijuana, cocaine), alcohol abuse, hypertension, depression, bipolar disorder and schizophrenia. Head CT showed basal cisterns and frontal interhemispheric subarachnoid hemorrhage (modified Fisher 1). 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 She was started on Cleviprex  for blood pressure control. She comes to our service for a diagnostic cerebral angiogram and possible endovascular aneurysm embolization. EXAM: ULTRASOUND-GUIDED VASCULAR ACCESS DIAGNOSTIC CEREBRAL ANGIOGRAM ENDOVASCULAR ANEURYSM EMBOLIZATION FLAT PANEL HEAD CT COMPARISON:  CT/CT angiogram of the head and neck June 03, 2023. MEDICATIONS: 2 g of Ancef  were administered intravenously due to closure device protruding through skin puncture site in the right groin. ANESTHESIA/SEDATION: The procedure was performed under general anesthesia. CONTRAST:  100 mL of Omnipaque  300 milligram/mL FLUOROSCOPY: Radiation Exposure Index (as provided by the fluoroscopic device): 2,180 mGy Kerma COMPLICATIONS: None immediate. TECHNIQUE: Patient unable to consent for herself due to sedation.  No healthcare proxy or next of kin available for consent. Decision made to proceed with intervention in the best interest of the patient, as discussed with the emergency and neuro surgery attending physicians. Maximal Sterile Barrier Technique was utilized including caps, mask, sterile gowns, sterile gloves, sterile drape, hand hygiene and skin antiseptic. A timeout was performed prior to the initiation of the procedure. The  right groin was prepped and draped in the usual sterile fashion. Using a micropuncture kit and the modified Seldinger technique, access was gained to the right common femoral artery and an 8 French sheath was placed. Real-time ultrasound guidance was utilized for vascular access including the acquisition of a permanent ultrasound image documenting patency of the accessed vessel. Under fluoroscopy, a 5 French Berenstein 2 catheter was navigated over a 0.035 Terumo Glidewire into the aortic arch. The catheter was placed into the left subclavian artery and then advanced into the left vertebral artery. Frontal and lateral angiograms of the head were obtained. The catheter was subsequently placed into the right subclavian artery and then advanced into the right vertebral artery. Frontal and lateral angiograms of the head were obtained. Next, the catheter was placed into right common carotid artery. Frontal and lateral angiograms of the neck were obtained. Using biplane roadmap guidance, the catheter was placed into the right internal carotid artery. Angiograms with frontal, lateral, magnified right anterior oblique and magnified lateral views of the head were obtained. The catheter was placed into the left common carotid artery. Frontal and lateral angiograms of the head were obtained. Using biplane roadmap guidance, the catheter was placed into the left internal carotid. Angiograms with frontal and lateral views of the head were obtained. 3D rotational angiograms were acquired and post processed in a separate workstation under concurrent attending physician supervision. Selected images were sent to PACS. Additionally, magnified left anterior oblique and magnified lateral angiograms of the head were obtained, centered on the anterior communicating artery. FINDINGS: Left vertebral artery angiograms: The left vertebral artery, basilar artery, and bilateral posterior cerebral arteries are unremarkable. Luminal caliber is  smooth and tapering. No aneurysms or abnormally high-flow, early draining veins are seen. No regions of abnormal hypervascularity are noted. The visualized dural sinuses are patent. Right vertebral artery angiograms: The right vertebral artery, basilar artery, and bilateral posterior cerebral arteries are unremarkable. Luminal caliber is smooth and tapering. No aneurysms or abnormally high-flow, early draining veins are seen. No regions of abnormal hypervascularity are noted. The visualized dural sinuses are patent. Right CCA angiograms: Cervical angiograms show normal course and caliber of the visualized right common carotid and internal carotid arteries. There are no significant stenoses. Right ICA angiograms: There is brisk vascular contrast filling of the right ACA and MCA vascular trees. Luminal caliber is smooth and tapering. No aneurysms or abnormally high-flow, early draining veins are seen. No regions of abnormal hypervascularity are noted. The visualized dural sinuses are patent. Left CCA angiograms: Cervical angiograms show normal course and caliber of the visualized left common carotid and internal carotid arteries. There are no significant stenoses. Left ICA angiograms: There is brisk vascular contrast filling of the left ACA and MCA vascular trees. 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 The aneurysm measures 5 x 3 x 2.7 mm. There is early left A2/ACA bifurcation. 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 Luminal caliber is smooth and tapering. No abnormally high-flow, early draining veins are seen. No regions  of abnormal hypervascularity are noted. The visualized dural sinuses are patent. Right common femoral artery ultrasound evaluation: The femoral artery has normal caliber, adequate for vascular access. PROCEDURE: The Berenstein 2 catheter was exchanged over the wire and under  biplane roadmap guidance for a serum beta guide catheter which was placed in the proximal cervical left ICA. Magnified frontal and lateral angiograms of the head were obtained, in the working projections. Using tri-axial technique, navigation of a 6 French Sofia intermediate catheter was performed over an SL 10 microcatheter and Aristotle 14 microguidewire through the guide catheter into the cavernous segment of the left ICA. The microcatheter was then advanced over the wire into the left A1/ACA segment and subsequently into the anterior communicating artery aneurysm pouch. Then, a 2.5 mm x 4.5 cm target tetra coil was placed within the aneurysm pouch under fluoroscopic guidance. Control angiograms were obtained. Neck is, a 2 mm x 3.5 cm target tetra coil was placed within the aneurysm pouch under fluoroscopic guidance. Control angiograms were obtained. Finally, a 1.5 mm x 2 cm target tetra coil was placed within the aneurysm pouch under fluoroscopic guidance. Control angiograms were obtained showing complete aneurysm occlusion with preservation of the parent artery. Left internal carotid artery angiograms with frontal lateral views of the entire head showed no evidence of thromboembolic complication. Flat panel CT of the head was obtained and post processed in a separate workstation with concurrent attending physician supervision. Selected images were sent to PACS. Similar appearance of known subarachnoid hemorrhage with no evidence of recurrent bleed. 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 The catheter was subsequently withdrawn. Right common femoral artery angiogram was obtained in a. The puncture is at the level of the common femoral artery. The artery has normal caliber, adequate for closure device. The sheath was exchanged over the wire for a Perclose prostyle which was  utilized for access closure. Immediate hemostasis was achieved. IMPRESSION: 1. 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 No other aneurysm identified. 2. Primary coil embolization performed with complete aneurysm occlusion. No evidence of complication. PLAN: Follow-up MR angiogram in 6 months to evaluate for treatment outcome. Electronically Signed   By: Katyucia  de Macedo Rodrigues M.D.   On: 06/06/2023 12:40   IR Angiogram Follow Up Study Result Date: 06/06/2023 INDICATION: Tiffany [name] is a 38 year old female whole was brought by EMS to the ED on 06/03/2023 complaining of headache. Her past medical history significant for polysubstance abuse (marijuana, cocaine), alcohol abuse, hypertension, depression, bipolar disorder and schizophrenia. Head CT showed basal cisterns and frontal interhemispheric subarachnoid hemorrhage (modified Fisher 1). 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 She was started on Cleviprex  for blood pressure control. She comes to our service for a diagnostic cerebral angiogram and possible endovascular aneurysm embolization. EXAM: ULTRASOUND-GUIDED VASCULAR ACCESS DIAGNOSTIC CEREBRAL ANGIOGRAM ENDOVASCULAR ANEURYSM EMBOLIZATION FLAT PANEL HEAD CT COMPARISON:  CT/CT angiogram of the head and neck June 03, 2023. MEDICATIONS: 2 g of Ancef  were administered intravenously due to closure device protruding through skin puncture site in the right groin. ANESTHESIA/SEDATION: The procedure was performed under general anesthesia. CONTRAST:  100 mL of Omnipaque  300 milligram/mL FLUOROSCOPY: Radiation Exposure Index (as provided by the fluoroscopic device): 2,180 mGy Kerma COMPLICATIONS: None immediate. TECHNIQUE: Patient unable to consent for herself due to sedation. No healthcare  proxy or next of kin available for consent. Decision made to proceed with intervention in the best  interest of the patient, as discussed with the emergency and neuro surgery attending physicians. Maximal Sterile Barrier Technique was utilized including caps, mask, sterile gowns, sterile gloves, sterile drape, hand hygiene and skin antiseptic. A timeout was performed prior to the initiation of the procedure. The right groin was prepped and draped in the usual sterile fashion. Using a micropuncture kit and the modified Seldinger technique, access was gained to the right common femoral artery and an 8 French sheath was placed. Real-time ultrasound guidance was utilized for vascular access including the acquisition of a permanent ultrasound image documenting patency of the accessed vessel. Under fluoroscopy, a 5 French Berenstein 2 catheter was navigated over a 0.035 Terumo Glidewire into the aortic arch. The catheter was placed into the left subclavian artery and then advanced into the left vertebral artery. Frontal and lateral angiograms of the head were obtained. The catheter was subsequently placed into the right subclavian artery and then advanced into the right vertebral artery. Frontal and lateral angiograms of the head were obtained. Next, the catheter was placed into right common carotid artery. Frontal and lateral angiograms of the neck were obtained. Using biplane roadmap guidance, the catheter was placed into the right internal carotid artery. Angiograms with frontal, lateral, magnified right anterior oblique and magnified lateral views of the head were obtained. The catheter was placed into the left common carotid artery. Frontal and lateral angiograms of the head were obtained. Using biplane roadmap guidance, the catheter was placed into the left internal carotid. Angiograms with frontal and lateral views of the head were obtained. 3D rotational angiograms were acquired and post processed in a separate workstation under concurrent attending physician supervision. Selected images were sent to PACS.  Additionally, magnified left anterior oblique and magnified lateral angiograms of the head were obtained, centered on the anterior communicating artery. FINDINGS: Left vertebral artery angiograms: The left vertebral artery, basilar artery, and bilateral posterior cerebral arteries are unremarkable. Luminal caliber is smooth and tapering. No aneurysms or abnormally high-flow, early draining veins are seen. No regions of abnormal hypervascularity are noted. The visualized dural sinuses are patent. Right vertebral artery angiograms: The right vertebral artery, basilar artery, and bilateral posterior cerebral arteries are unremarkable. Luminal caliber is smooth and tapering. No aneurysms or abnormally high-flow, early draining veins are seen. No regions of abnormal hypervascularity are noted. The visualized dural sinuses are patent. Right CCA angiograms: Cervical angiograms show normal course and caliber of the visualized right common carotid and internal carotid arteries. There are no significant stenoses. Right ICA angiograms: There is brisk vascular contrast filling of the right ACA and MCA vascular trees. Luminal caliber is smooth and tapering. No aneurysms or abnormally high-flow, early draining veins are seen. No regions of abnormal hypervascularity are noted. The visualized dural sinuses are patent. Left CCA angiograms: Cervical angiograms show normal course and caliber of the visualized left common carotid and internal carotid arteries. There are no significant stenoses. Left ICA angiograms: There is brisk vascular contrast filling of the left ACA and MCA vascular trees. 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 The aneurysm measures 5 x 3 x 2.7 mm. There is early left A2/ACA bifurcation. 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 Luminal caliber is smooth and tapering. No abnormally high-flow,  early draining veins are seen. No regions of  abnormal hypervascularity are noted. The visualized dural sinuses are patent. Right common femoral artery ultrasound evaluation: The femoral artery has normal caliber, adequate for vascular access. PROCEDURE: The Berenstein 2 catheter was exchanged over the wire and under biplane roadmap guidance for a serum beta guide catheter which was placed in the proximal cervical left ICA. Magnified frontal and lateral angiograms of the head were obtained, in the working projections. Using tri-axial technique, navigation of a 6 French Sofia intermediate catheter was performed over an SL 10 microcatheter and Aristotle 14 microguidewire through the guide catheter into the cavernous segment of the left ICA. The microcatheter was then advanced over the wire into the left A1/ACA segment and subsequently into the anterior communicating artery aneurysm pouch. Then, a 2.5 mm x 4.5 cm target tetra coil was placed within the aneurysm pouch under fluoroscopic guidance. Control angiograms were obtained. Neck is, a 2 mm x 3.5 cm target tetra coil was placed within the aneurysm pouch under fluoroscopic guidance. Control angiograms were obtained. Finally, a 1.5 mm x 2 cm target tetra coil was placed within the aneurysm pouch under fluoroscopic guidance. Control angiograms were obtained showing complete aneurysm occlusion with preservation of the parent artery. Left internal carotid artery angiograms with frontal lateral views of the entire head showed no evidence of thromboembolic complication. Flat panel CT of the head was obtained and post processed in a separate workstation with concurrent attending physician supervision. Selected images were sent to PACS. Similar appearance of known subarachnoid hemorrhage with no evidence of recurrent bleed. Delayed angiograms with magnified frontal and lateral views of the head showed complete occlusion of the anterior communicating artery aneurysm with no  evidence of complication. The catheter was subsequently withdrawn. Right common femoral artery angiogram was obtained in a. The puncture is at the level of the common femoral artery. The artery has normal caliber, adequate for closure device. The sheath was exchanged over the wire for a Perclose prostyle which was utilized for access closure. Immediate hemostasis was achieved. IMPRESSION: 1. 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 No other aneurysm identified. 2. Primary coil embolization performed with complete aneurysm occlusion. No evidence of complication. PLAN: Follow-up MR angiogram in 6 months to evaluate for treatment outcome. Electronically Signed   By: Katyucia  de Macedo Rodrigues M.D.   On: 06/06/2023 12:40   IR Angiogram Follow Up Study Result Date: 06/06/2023 INDICATION: Tiffany [name] is a 38 year old female whole was brought by EMS to the ED on 06/03/2023 complaining of headache. Her past medical history significant for polysubstance abuse (marijuana, cocaine), alcohol abuse, hypertension, depression, bipolar disorder and schizophrenia. Head CT showed basal cisterns and frontal interhemispheric subarachnoid hemorrhage (modified Fisher 1). 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 She was started on Cleviprex  for blood pressure control. She comes to our service for a diagnostic cerebral angiogram and possible endovascular aneurysm embolization. EXAM: ULTRASOUND-GUIDED VASCULAR ACCESS DIAGNOSTIC CEREBRAL ANGIOGRAM ENDOVASCULAR ANEURYSM EMBOLIZATION FLAT PANEL HEAD CT COMPARISON:  CT/CT angiogram of the head and neck June 03, 2023. MEDICATIONS: 2 g of Ancef  were administered intravenously due to closure device protruding through skin puncture site in the right groin. ANESTHESIA/SEDATION: The procedure was performed under general anesthesia. CONTRAST:  100 mL of Omnipaque  300  milligram/mL FLUOROSCOPY: Radiation Exposure Index (as provided by the fluoroscopic device): 2,180 mGy Kerma COMPLICATIONS: None immediate. TECHNIQUE: Patient unable to consent for herself due to sedation. No healthcare proxy  or next of kin available for consent. Decision made to proceed with intervention in the best interest of the patient, as discussed with the emergency and neuro surgery attending physicians. Maximal Sterile Barrier Technique was utilized including caps, mask, sterile gowns, sterile gloves, sterile drape, hand hygiene and skin antiseptic. A timeout was performed prior to the initiation of the procedure. The right groin was prepped and draped in the usual sterile fashion. Using a micropuncture kit and the modified Seldinger technique, access was gained to the right common femoral artery and an 8 French sheath was placed. Real-time ultrasound guidance was utilized for vascular access including the acquisition of a permanent ultrasound image documenting patency of the accessed vessel. Under fluoroscopy, a 5 French Berenstein 2 catheter was navigated over a 0.035 Terumo Glidewire into the aortic arch. The catheter was placed into the left subclavian artery and then advanced into the left vertebral artery. Frontal and lateral angiograms of the head were obtained. The catheter was subsequently placed into the right subclavian artery and then advanced into the right vertebral artery. Frontal and lateral angiograms of the head were obtained. Next, the catheter was placed into right common carotid artery. Frontal and lateral angiograms of the neck were obtained. Using biplane roadmap guidance, the catheter was placed into the right internal carotid artery. Angiograms with frontal, lateral, magnified right anterior oblique and magnified lateral views of the head were obtained. The catheter was placed into the left common carotid artery. Frontal and lateral angiograms of the head were obtained. Using  biplane roadmap guidance, the catheter was placed into the left internal carotid. Angiograms with frontal and lateral views of the head were obtained. 3D rotational angiograms were acquired and post processed in a separate workstation under concurrent attending physician supervision. Selected images were sent to PACS. Additionally, magnified left anterior oblique and magnified lateral angiograms of the head were obtained, centered on the anterior communicating artery. FINDINGS: Left vertebral artery angiograms: The left vertebral artery, basilar artery, and bilateral posterior cerebral arteries are unremarkable. Luminal caliber is smooth and tapering. No aneurysms or abnormally high-flow, early draining veins are seen. No regions of abnormal hypervascularity are noted. The visualized dural sinuses are patent. Right vertebral artery angiograms: The right vertebral artery, basilar artery, and bilateral posterior cerebral arteries are unremarkable. Luminal caliber is smooth and tapering. No aneurysms or abnormally high-flow, early draining veins are seen. No regions of abnormal hypervascularity are noted. The visualized dural sinuses are patent. Right CCA angiograms: Cervical angiograms show normal course and caliber of the visualized right common carotid and internal carotid arteries. There are no significant stenoses. Right ICA angiograms: There is brisk vascular contrast filling of the right ACA and MCA vascular trees. Luminal caliber is smooth and tapering. No aneurysms or abnormally high-flow, early draining veins are seen. No regions of abnormal hypervascularity are noted. The visualized dural sinuses are patent. Left CCA angiograms: Cervical angiograms show normal course and caliber of the visualized left common carotid and internal carotid arteries. There are no significant stenoses. Left ICA angiograms: There is brisk vascular contrast filling of the left ACA and MCA vascular trees. A left laterally projecting  saccular aneurysm is seen at left A1-A2 junction, corresponding to aneurysm described recent CT angiogram. The aneurysm measures 5 x 3 x 2.7 mm. There is early left A2/ACA bifurcation. 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 Luminal caliber is smooth and tapering. No abnormally high-flow, early draining veins are seen. No regions of abnormal hypervascularity  are noted. The visualized dural sinuses are patent. Right common femoral artery ultrasound evaluation: The femoral artery has normal caliber, adequate for vascular access. PROCEDURE: The Berenstein 2 catheter was exchanged over the wire and under biplane roadmap guidance for a serum beta guide catheter which was placed in the proximal cervical left ICA. Magnified frontal and lateral angiograms of the head were obtained, in the working projections. Using tri-axial technique, navigation of a 6 French Sofia intermediate catheter was performed over an SL 10 microcatheter and Aristotle 14 microguidewire through the guide catheter into the cavernous segment of the left ICA. The microcatheter was then advanced over the wire into the left A1/ACA segment and subsequently into the anterior communicating artery aneurysm pouch. Then, a 2.5 mm x 4.5 cm target tetra coil was placed within the aneurysm pouch under fluoroscopic guidance. Control angiograms were obtained. Neck is, a 2 mm x 3.5 cm target tetra coil was placed within the aneurysm pouch under fluoroscopic guidance. Control angiograms were obtained. Finally, a 1.5 mm x 2 cm target tetra coil was placed within the aneurysm pouch under fluoroscopic guidance. Control angiograms were obtained showing complete aneurysm occlusion with preservation of the parent artery. Left internal carotid artery angiograms with frontal lateral views of the entire head showed no evidence of thromboembolic complication. Flat panel CT of the head was obtained and post processed in a separate workstation with  concurrent attending physician supervision. Selected images were sent to PACS. Similar appearance of known subarachnoid hemorrhage with no evidence of recurrent bleed. 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 The catheter was subsequently withdrawn. Right common femoral artery angiogram was obtained in a. The puncture is at the level of the common femoral artery. The artery has normal caliber, adequate for closure device. The sheath was exchanged over the wire for a Perclose prostyle which was utilized for access closure. Immediate hemostasis was achieved. IMPRESSION: 1. 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 No other aneurysm identified. 2. Primary coil embolization performed with complete aneurysm occlusion. No evidence of complication. PLAN: Follow-up MR angiogram in 6 months to evaluate for treatment outcome. Electronically Signed   By: Katyucia  de Macedo Rodrigues M.D.   On: 06/06/2023 12:40   IR Angiogram Follow Up Study Result Date: 06/06/2023 INDICATION: Tiffany [name] is a 38 year old female whole was brought by EMS to the ED on 06/03/2023 complaining of headache. Her past medical history significant for polysubstance abuse (marijuana, cocaine), alcohol abuse, hypertension, depression, bipolar disorder and schizophrenia. Head CT showed basal cisterns and frontal interhemispheric subarachnoid hemorrhage (modified Fisher 1). 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 She was started on Cleviprex  for blood pressure control. She comes to our service for a diagnostic cerebral angiogram and possible endovascular aneurysm embolization. EXAM: ULTRASOUND-GUIDED VASCULAR ACCESS DIAGNOSTIC CEREBRAL ANGIOGRAM ENDOVASCULAR ANEURYSM EMBOLIZATION FLAT PANEL HEAD CT  COMPARISON:  CT/CT angiogram of the head and neck June 03, 2023. MEDICATIONS: 2 g of Ancef  were administered intravenously due to closure device protruding through skin puncture site in the right groin. ANESTHESIA/SEDATION: The procedure was performed under general anesthesia. CONTRAST:  100 mL of Omnipaque  300 milligram/mL FLUOROSCOPY: Radiation Exposure Index (as provided by the fluoroscopic device): 2,180 mGy Kerma COMPLICATIONS: None immediate. TECHNIQUE: Patient unable to consent for herself due to sedation. No healthcare proxy or next  of kin available for consent. Decision made to proceed with intervention in the best interest of the patient, as discussed with the emergency and neuro surgery attending physicians. Maximal Sterile Barrier Technique was utilized including caps, mask, sterile gowns, sterile gloves, sterile drape, hand hygiene and skin antiseptic. A timeout was performed prior to the initiation of the procedure. The right groin was prepped and draped in the usual sterile fashion. Using a micropuncture kit and the modified Seldinger technique, access was gained to the right common femoral artery and an 8 French sheath was placed. Real-time ultrasound guidance was utilized for vascular access including the acquisition of a permanent ultrasound image documenting patency of the accessed vessel. Under fluoroscopy, a 5 French Berenstein 2 catheter was navigated over a 0.035 Terumo Glidewire into the aortic arch. The catheter was placed into the left subclavian artery and then advanced into the left vertebral artery. Frontal and lateral angiograms of the head were obtained. The catheter was subsequently placed into the right subclavian artery and then advanced into the right vertebral artery. Frontal and lateral angiograms of the head were obtained. Next, the catheter was placed into right common carotid artery. Frontal and lateral angiograms of the neck were obtained. Using biplane roadmap guidance,  the catheter was placed into the right internal carotid artery. Angiograms with frontal, lateral, magnified right anterior oblique and magnified lateral views of the head were obtained. The catheter was placed into the left common carotid artery. Frontal and lateral angiograms of the head were obtained. Using biplane roadmap guidance, the catheter was placed into the left internal carotid. Angiograms with frontal and lateral views of the head were obtained. 3D rotational angiograms were acquired and post processed in a separate workstation under concurrent attending physician supervision. Selected images were sent to PACS. Additionally, magnified left anterior oblique and magnified lateral angiograms of the head were obtained, centered on the anterior communicating artery. FINDINGS: Left vertebral artery angiograms: The left vertebral artery, basilar artery, and bilateral posterior cerebral arteries are unremarkable. Luminal caliber is smooth and tapering. No aneurysms or abnormally high-flow, early draining veins are seen. No regions of abnormal hypervascularity are noted. The visualized dural sinuses are patent. Right vertebral artery angiograms: The right vertebral artery, basilar artery, and bilateral posterior cerebral arteries are unremarkable. Luminal caliber is smooth and tapering. No aneurysms or abnormally high-flow, early draining veins are seen. No regions of abnormal hypervascularity are noted. The visualized dural sinuses are patent. Right CCA angiograms: Cervical angiograms show normal course and caliber of the visualized right common carotid and internal carotid arteries. There are no significant stenoses. Right ICA angiograms: There is brisk vascular contrast filling of the right ACA and MCA vascular trees. Luminal caliber is smooth and tapering. No aneurysms or abnormally high-flow, early draining veins are seen. No regions of abnormal hypervascularity are noted. The visualized dural sinuses are  patent. Left CCA angiograms: Cervical angiograms show normal course and caliber of the visualized left common carotid and internal carotid arteries. There are no significant stenoses. Left ICA angiograms: There is brisk vascular contrast filling of the left ACA and MCA vascular trees. 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 The aneurysm measures 5 x 3 x 2.7 mm. There is early left A2/ACA bifurcation. 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 Luminal caliber is smooth and tapering. No abnormally high-flow, early draining veins are seen. No regions of abnormal hypervascularity are noted.  The visualized dural sinuses are patent. Right common femoral artery ultrasound evaluation: The femoral artery has normal caliber, adequate for vascular access. PROCEDURE: The Berenstein 2 catheter was exchanged over the wire and under biplane roadmap guidance for a serum beta guide catheter which was placed in the proximal cervical left ICA. Magnified frontal and lateral angiograms of the head were obtained, in the working projections. Using tri-axial technique, navigation of a 6 French Sofia intermediate catheter was performed over an SL 10 microcatheter and Aristotle 14 microguidewire through the guide catheter into the cavernous segment of the left ICA. The microcatheter was then advanced over the wire into the left A1/ACA segment and subsequently into the anterior communicating artery aneurysm pouch. Then, a 2.5 mm x 4.5 cm target tetra coil was placed within the aneurysm pouch under fluoroscopic guidance. Control angiograms were obtained. Neck is, a 2 mm x 3.5 cm target tetra coil was placed within the aneurysm pouch under fluoroscopic guidance. Control angiograms were obtained. Finally, a 1.5 mm x 2 cm target tetra coil was placed within the aneurysm pouch under fluoroscopic guidance. Control angiograms were  obtained showing complete aneurysm occlusion with preservation of the parent artery. Left internal carotid artery angiograms with frontal lateral views of the entire head showed no evidence of thromboembolic complication. Flat panel CT of the head was obtained and post processed in a separate workstation with concurrent attending physician supervision. Selected images were sent to PACS. Similar appearance of known subarachnoid hemorrhage with no evidence of recurrent bleed. 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 The catheter was subsequently withdrawn. Right common femoral artery angiogram was obtained in a. The puncture is at the level of the common femoral artery. The artery has normal caliber, adequate for closure device. The sheath was exchanged over the wire for a Perclose prostyle which was utilized for access closure. Immediate hemostasis was achieved. IMPRESSION: 1. 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 No other aneurysm identified. 2. Primary coil embolization performed with complete aneurysm occlusion. No evidence of complication. PLAN: Follow-up MR angiogram in 6 months to evaluate for treatment outcome. Electronically Signed   By: Katyucia  de Macedo Rodrigues M.D.   On: 06/06/2023 12:40   CT HEAD WO CONTRAST ( ) Result Date: 06/06/2023 CLINICAL DATA:  Subarachnoid hemorrhage [facility]).  Follow-up. EXAM: CT HEAD WITHOUT CONTRAST TECHNIQUE: Contiguous axial images were obtained from the base of the skull through the vertex without intravenous contrast. RADIATION DOSE REDUCTION: This exam was performed according to the departmental dose-optimization program which includes automated exposure control, adjustment of the mA and/or kV according to patient size and/or use of iterative reconstruction  technique. COMPARISON:  CTA head/neck 06/03/2023. FINDINGS: Brain: Unchanged acute subarachnoid hemorrhage along the anterior falx with slight intraparenchymal extension into the left basal forebrain. No new hemorrhage or new loss of gray-white differentiation. No hydrocephalus, mass effect or midline shift. 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 Skull: No calvarial fracture or suspicious bone lesion. Skull base is unremarkable. Sinuses/Orbits: No acute finding. Other: None. IMPRESSION: 1. Unchanged acute subarachnoid hemorrhage along the anterior falx with slight intraparenchymal extension into the left basal forebrain. No new hemorrhage or new loss of gray-white differentiation. 2. 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 Electronically Signed   By: Ryan Chess M.D.   On: 06/06/2023 12:24  Portable Chest xray Result Date: 06/04/2023 CLINICAL DATA:  Respiratory failure. EXAM: PORTABLE CHEST 1 VIEW COMPARISON:  January 27, 2021. FINDINGS: The heart size and mediastinal contours are within normal limits. Endotracheal and nasogastric tubes are in grossly good position. Both lungs are clear. The visualized skeletal structures are unremarkable. IMPRESSION: No active disease. Electronically Signed   By: Lynwood Landy Raddle M.D.   On: 06/04/2023 10:08   DG Abd Portable 1V Result Date: 06/03/2023 CLINICAL DATA:  Orogastric tube placement. EXAM: PORTABLE ABDOMEN - 1 VIEW COMPARISON:  None Available. FINDINGS: Tip and side port of the enteric tube below the diaphragm in the stomach. There is mild gaseous distention of stomach and small bowel centrally. No evidence of free air on this portable supine view. IMPRESSION: Tip and side port of the enteric tube below the diaphragm in the stomach. Electronically Signed   By: Andrea Gasman M.D.   On: 06/03/2023 18:29   CT ANGIO HEAD NECK W WO CM Addendum Date: 06/03/2023 ADDENDUM REPORT:  06/03/2023 05:06 ADDENDUM: Study discussed by telephone with PA ROBERT BROWNING on 06/03/2023 at 0437 hours. Electronically Signed   By: VEAR Hurst M.D.   On: 06/03/2023 05:06   Result Date: 06/03/2023 CLINICAL DATA:  38 year old female with altered mental status. Subarachnoid hemorrhage suspicious for ruptured intracranial aneurysm. EXAM: CT ANGIOGRAPHY HEAD AND NECK WITH AND WITHOUT CONTRAST TECHNIQUE: Multidetector CT imaging of the head and neck was performed using the standard protocol during bolus administration of intravenous contrast. Multiplanar CT image reconstructions and MIPs were obtained to evaluate the vascular anatomy. Carotid stenosis measurements (when applicable) are obtained utilizing NASCET criteria, using the distal internal carotid diameter as the denominator. RADIATION DOSE REDUCTION: This exam was performed according to the departmental dose-optimization program which includes automated exposure control, adjustment of the mA and/or kV according to patient size and/or use of iterative reconstruction technique. CONTRAST:  60mL OMNIPAQUE  IOHEXOL  350 MG/ML SOLN COMPARISON:  Head CT 0333 hours today. FINDINGS: CT HEAD Brain: Stable 16 mm area of what appears to be primarily extra-axial hemorrhage in the interhemispheric fissure anterior to the 3rd ventricle (series 5, image 21) and a small amount of additional anterior inter hemispheric subarachnoid blood. Underlying normal cerebral volume. No ventriculomegaly. No IVH is visible. Small volume of additional bilateral basilar cistern blood is suspected, such as elsewhere in the suprasellar and sylvian fissures, stable. No discrete cerebral edema or cortically based infarct identified. Pre medullary cistern and cisterna magna remain normal. Calvarium and skull base: Intact, negative. Paranasal sinuses: Visualized paranasal sinuses and mastoids are stable and well aerated. Orbits: Visualized orbits and scalp soft tissues are within normal limits. CTA  NECK Skeleton: No acute osseous abnormality identified. Age advanced cervical disc bulging and endplate spurring. Upper chest: Negative.  Visible upper lungs are clear. Other neck: Negative. Aortic arch: 3 vessel arch.  No arch atherosclerosis. Right carotid system: Negative. Left carotid system: Negative. Vertebral arteries: Mild calcified plaque at the right subclavian artery origin. No stenosis. Normal right vertebral artery origin. Right vertebral artery is patent and within normal limits to the skull base. Proximal left subclavian artery and left vertebral artery origin are normal. Left vertebral artery is codominant, patent within normal limits to the skull base. CTA HEAD Posterior circulation: Codominant distal vertebral arteries, PICA origins, vertebrobasilar junction appear normal. Patent basilar artery without stenosis. Basilar tip, SCA and PCA origins are within normal limits. Diminutive posterior communicating arteries are present. Bilateral PCA branches are within normal limits. Anterior circulation: Both  ICA siphons are patent no discrete siphon plaque, but diminutive appearance of the supraclinoid ICAs probably reflects a degree of vaso spasm. MCA and ACA origins are patent but also diminutive. Anteriorly and superiorly directed saccular aneurysm arises from the anterior communicating artery at the junction with the left ACA (series 9, image 103, series 13, image 88). It is up to 4 mm in length. The tip is bulbous, about 3 mm and mildly irregular. This is directed slightly to the left of midline. Both ACA A2 segments remain patent. Other ACA branches are within normal limits. Left MCA M1 and bifurcation are patent. Right MCA M1 and bifurcation are patent. No MCA aneurysm. Bilateral MCA branches are within normal limits. Venous sinuses: Patent. Anatomic variants: None. Review of the MIP images confirms the above findings IMPRESSION: 1. 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 2. Mild intracranial artery Vasospasm. No large vessel occlusion. No 2nd aneurysm identified. No significant atherosclerosis in the head or neck. 3. Stable small volume acute intracranial hemorrhage from #1. No IVH, ventriculomegaly, midline shift. Electronically Signed: By: VEAR Hurst M.D. On: 06/03/2023 04:37   CT HEAD WO CONTRAST ( ) Result Date: 06/03/2023 CLINICAL DATA:  Mental status change, unknown cause EXAM: CT HEAD WITHOUT CONTRAST TECHNIQUE: Contiguous axial images were obtained from the base of the skull through the vertex without intravenous contrast. RADIATION DOSE REDUCTION: This exam was performed according to the departmental dose-optimization program which includes automated exposure control, adjustment of the mA and/or kV according to patient size and/or use of iterative reconstruction technique. COMPARISON:  CT head 11/04/2011 FINDINGS: Brain: No evidence of large-territorial acute infarction. No parenchymal hemorrhage. No mass lesion. Parafalcine curvilinear and likely fall or hyperdensity measuring up to 17 mm with associated adjacent bilateral small volume inferior frontal subarachnoid hemorrhage (5:44, 3:22). No hydrocephalus. Basilar cisterns are patent. Vascular: No hyperdense vessel. Skull: No acute fracture or focal lesion. Sinuses/Orbits: Paranasal sinuses and mastoid air cells are clear. The orbits are unremarkable. Other: None. IMPRESSION: Findings suggestive of a bleeding A-comm aneurysm with small bilateral frontal subarachnoid hemorrhage. These results were called by telephone at the time of interpretation on 06/03/2023 at 3:47 am to provider LAMAR SCHLOSSMAN , who verbally acknowledged these results. Electronically Signed   By: Morgane  Naveau M.D.   On: 06/03/2023 03:47      Subjective: When seen this morning, patient was noted to be ambulating steadily and  without any issues in the hallway.  Subsequently seen her lying in her bed with safety sitter at bedside.  Patient reports that she has not had headache for several days.  She had been taking the occasional tramadol  for questionable low back pain.  She denied suicidal or homicidal ideations.  Advised her that we were waiting for psychiatrist to evaluate her to determine if she still needed to go to inpatient psychiatry for further management or they would clear her for discharge home.  Thereafter this afternoon, for psychiatry evaluation, the RN reported that patient became agitated, required activation of security/[employer], IM Haldol .  Patient also insisting on telemetry and IV to be removed otherwise she was going to pull them off herself.  Discharge Exam:  Vitals:   06/13/23 0507 06/13/23 0729 06/13/23 1053 06/13/23 1420  BP: (!) 100/59 117/73 110/64 117/72  Pulse: 66 60 67 80  Resp: 19 19 17 20   Temp: 98.3 F (36.8 C) 98.5  F (36.9 C) 98.2 F (36.8 C) 98.4 F (36.9 C)  TempSrc: Oral Oral Oral Oral  SpO2: 98% 100% 100% 100%  Weight:      Height:        General exam: Young female, moderately built and nourished, seen ambulating comfortably and steadily in hallway without discomfort, subsequently laying in bed comfortably. Respiratory system: Clear to auscultation. Respiratory effort normal. Cardiovascular system: S1 & S2 heard, RRR. No JVD, murmurs, rubs, gallops or clicks. No pedal edema.  Telemetry personally reviewed: Sinus rhythm. Gastrointestinal system: Abdomen is nondistended, soft and nontender. No organomegaly or masses felt. Normal bowel sounds heard. Central nervous system: Alert and oriented. No focal neurological deficits. Extremities: Symmetric 5 x 5 power. Skin: No rashes, lesions or ulcers Psychiatry: Judgement and insight appear normal. Mood & affect appropriate.     The results of significant diagnostics from this hospitalization (including imaging, microbiology,  ancillary and laboratory) are listed below for reference.     Microbiology: Recent Results (from the past 240 hours)  MRSA Next Gen by PCR, Nasal     Status: None   Collection Time: 06/03/23 10:19 PM   Specimen: Nasal Mucosa; Nasal Swab  Result Value Ref Range Status   MRSA by PCR Next Gen NOT DETECTED NOT DETECTED Final    Comment: (NOTE) The GeneXpert MRSA Assay (FDA approved for NASAL specimens only), is one component of a comprehensive MRSA colonization surveillance program. It is not intended to diagnose MRSA infection nor to guide or monitor treatment for MRSA infections. Test performance is not FDA approved in patients less than 36 years old. Performed at [facility], [address], [place], [place] [address]      Labs: CBC: Recent Labs  Lab 06/10/23 [phone] 06/11/23 0555 06/12/23 0958 06/13/23 0705  WBC 4.3 4.5 7.5 8.8  NEUTROABS 3.0 3.0 4.4 5.6  HGB 10.8* 10.7* 10.5* 10.2*  HCT 33.1* 32.9* 32.0* 31.3*  MCV 85.1 86.1 86.7 86.0  PLT 289 313 367 440*    Basic Metabolic Panel: Recent Labs  Lab 06/07/23 0846 06/10/23 0441 06/11/23 0555 06/12/23 0958 06/13/23 0705  NA 133* 133* 135 137 136  K 3.7 4.1 4.0 3.8 3.7  CL 100 100 102 102 101  CO2 23 24 25 28 24   GLUCOSE 123* 114* 136* 95 120*  BUN 8 16 10 12 17   CREATININE 0.77 0.81 0.63 0.60 0.86  CALCIUM 9.2 8.9 8.9 9.1 9.0  MG 2.0 1.9  --   --   --   PHOS 3.8 4.3  --   --   --      CBG: Recent Labs  Lab 06/12/23 1116 06/12/23 1622 06/12/23 2148 06/13/23 0727 06/13/23 1054  GLUCAP 113* 128* 95 136* 165*     Time coordinating discharge: 45 minutes  SIGNED:  Trenda Mar, MD,  FACP, [facility], [facility], [facility]   [facility]  Hospitalist & Physician Advisor [place]     To contact the attending provider between 7A-7P or the covering provider during after hours 7P-7A, please log into the web site [email or web address] and access using universal Rossie password for that web site. If you do not  have the password, please call the hospital operator.

## 2023-06-13 NOTE — TOC Transition Note (Signed)
 Transition of Care [facility]) - Discharge Note   Patient Details  Name: Tiffany [name] MRN: [id number] Date of Birth: [birth year]  Transition of Care [facility]) CM/SW Contact:  Almarie CHRISTELLA Goodie, LCSW Phone Number: 06/13/2023, 4:15 PM   Clinical Narrative:   CSW updated by psychiatry that patient will have a bed on [facility] today. CSW attempted to arrange transportation, [employer] will not accept call until ready for dispatch. CSW compiled all information to send to RN for night shift nurse.  Bed at [facility] will be available at 2030, patient going to room 405 bed 2. Report MUST BE CALLED prior to transport, number to call is [phone]. Transport must be arranged with [employer] as patient is under IVC, they can be contacted at [phone] (press 1, then press 3). [employer] will require a copy of the patient's IVC paperwork, it is on the chart.  No further TOC needs at this time.    Final next level of care: Psychiatric Hospital Barriers to Discharge: Barriers Resolved   Patient Goals and CMS Choice            Discharge Placement                       Discharge Plan and Services Additional resources added to the After Visit Summary for                                       Social Drivers of Health (SDOH) Interventions SDOH Screenings   Food Insecurity: Food Insecurity Present (06/05/2023)  Housing: High Risk (06/05/2023)  Transportation Needs: Unmet Transportation Needs (06/05/2023)  Utilities: At Risk (06/05/2023)  Social Connections: Unknown (10/17/2021)   Received from [facility], [facility]  Tobacco Use: High Risk (04/03/2023)     Readmission Risk Interventions     No data to display

## 2023-06-13 NOTE — TOC Progression Note (Signed)
 Transition of Care [facility]) - Progression Note    Patient Details  Name: Tiffany [name] MRN: [id number] Date of Birth: November 28, 1985  Transition of Care [facility]) CM/SW Contact  Almarie CHRISTELLA Goodie, [place] Phone Number: 06/13/2023, 2:52 PM  Clinical Narrative:   CSW updated by psychiatry of recommendation for inpatient psych, awaiting update on bed availability at [facility] ([facility]). CSW also updated by psychiatry that patient missed a court date, was asking for assistance in sending a letter. CSW attempted to meet with patient but was extremely agitated, requiring security and [employer] assistance; will follow up with patient at another time. CSW to follow.    Expected Discharge Plan: Psychiatric Hospital    Expected Discharge Plan and Services                                               Social Determinants of Health (SDOH) Interventions SDOH Screenings   Food Insecurity: Food Insecurity Present (06/05/2023)  Housing: High Risk (06/05/2023)  Transportation Needs: Unmet Transportation Needs (06/05/2023)  Utilities: At Risk (06/05/2023)  Social Connections: Unknown (10/17/2021)   Received from [facility], [facility]  Tobacco Use: High Risk (04/03/2023)    Readmission Risk Interventions     No data to display

## 2023-06-13 NOTE — Tx Team (Signed)
 Initial Treatment Plan 06/13/2023 10:15 PM Tiffany [name] FMW:[id number]    PATIENT STRESSORS: Financial difficulties   Health problems   Substance abuse     PATIENT STRENGTHS: Active sense of humor  Capable of independent living  Communication skills    PATIENT IDENTIFIED PROBLEMS: Depression   Substance use  Anxiety   Anger management   Weight gain             DISCHARGE CRITERIA:  Ability to meet basic life and health needs Improved stabilization in mood, thinking, and/or behavior Medical problems require only outpatient monitoring Motivation to continue treatment in a less acute level of care  PRELIMINARY DISCHARGE PLAN: Attend aftercare/continuing care group Attend PHP/IOP Outpatient therapy Return to previous living arrangement  PATIENT/FAMILY INVOLVEMENT: This treatment plan has been presented to and reviewed with the patient, Tiffany [name], and/or family member.  The patient and family have been given the opportunity to ask questions and make suggestions.  Slater MALVA Edin, RN 06/13/2023, 10:15 PM

## 2023-06-13 NOTE — Progress Notes (Signed)
 PT Cancellation Note  Patient Details Name: ALICEA WENTE MRN: [id number] DOB: 1985-10-05   Cancelled Treatment:    Reason Eval/Treat Not Completed: (P) Other (comment), per chart review pt very agitated, will hold therapy at this time and check back as schedule allows to continue with PT POC.   Therisa JONELLE. PTA Acute Rehabilitation Services Office: [phone]    Therisa CHRISTELLA Boor 06/13/2023, 2:29 PM

## 2023-06-13 NOTE — Progress Notes (Signed)
 Report called to [facility] by [facility] RN from dayshift.  [employer] communications notified of need for IVC transport.  IV removed and paper scrubs provided. Hilton Sinclair BSN RN CMSRN 06/13/2023, 7:38 PM

## 2023-06-13 NOTE — Progress Notes (Signed)
 Pt discharged ambulatory with [employer] in no distress.  She has been alert, oriented and calm/cooperative since my arrival.  All belongings sent with pt except for pocket knife which security stated they couldn't find.  Number to patient services listed in patient guide so pt can f/u.  Copy of IVC forms sent with officers. Glade Lee BSN RN CMSRN 06/13/2023, 8:39 PM

## 2023-06-13 NOTE — [facility] Group Notes (Signed)
 [facility] Group Notes:  (Nursing/MHT/Case Management/Adjunct)  Date:  06/13/2023  Time:  9:14 PM  Type of Therapy:   Wrap-up group  Participation Level:  Did Not Attend  Participation Quality:    Affect:    Cognitive:    Insight:    Engagement in Group:    Modes of Intervention:    Summary of Progress/Problems:Pt refused to attend group.  Grayce LITTIE Essex 06/13/2023, 9:14 PM
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
## 2023-06-13 NOTE — Progress Notes (Signed)
 Referring Physician(s): Dr. JINNY Budge   Supervising Physician: Everitt Sarks Rodrigues, [place]  Patient Status:  Tiffany [name] - In-pt  Chief Complaint: Follow up [facility] s/p primary coiling embolization in NIR 06/03/23   Subjective: Patient sitting on the edge of the bed eating pizza. She is smiling and appears to be happy. She denies any headaches and states she has been ambulating in the room. Safety sitter at the bedside.   Allergies: Shellfish allergy, Pollen extract, and Aspirin  Medications: Prior to Admission medications   Medication Sig Start Date End Date Taking? Authorizing Provider  ibuprofen  (ADVIL ) 200 MG tablet Take 400 mg by mouth every 6 (six) hours as needed for headache or mild pain.    [provider]  ibuprofen  (ADVIL ) 600 MG tablet Take 1 tablet (600 mg total) by mouth every 6 (six) hours as needed. 04/03/23   Dean Clarity, MD  methocarbamol  (ROBAXIN ) 500 MG tablet Take 1 tablet (500 mg total) by mouth 2 (two) times daily as needed for muscle spasms. 04/03/23   Dean Clarity, MD  traZODone  (DESYREL ) 50 MG tablet Take 1 tablet (50 mg total) by mouth at bedtime. 09/13/22   Dasie Ellouise CROME, FNP     Vital Signs: BP 110/64 (BP Location: Left Arm)   Pulse 67   Temp 98.2 F (36.8 C) (Oral)   Resp 17   Ht 5' 5.98 (1.676 m)   Wt 130 lb 8.2 oz (59.2 kg)   SpO2 100%   BMI 21.08 kg/m   Physical Exam Constitutional:      General: She is not in acute distress.    Appearance: She is not ill-appearing.  Pulmonary:     Effort: Pulmonary effort is normal.  Neurological:     Mental Status: She is alert and oriented to person, place, and time.     Imaging: VAS US  TRANSCRANIAL DOPPLER Result Date: 06/11/2023  Transcranial Doppler Patient Name:  Tiffany [name]  Date of Exam:   06/10/2023 Medical Rec #: [id number]         Accession #:    [id number] Date of Birth: 1985-10-25          Patient Gender: F Patient Age:   38 years Exam Location:  [facility] Procedure:       VAS US  TRANSCRANIAL DOPPLER Referring Phys: CURTIS DE MACEDO RODRIGUES --------------------------------------------------------------------------------  Indications: Subarachnoid hemorrhage. Performing Technologist: Ricka Holland RDMS, RVT  Examination Guidelines: A complete evaluation includes B-mode imaging, spectral Doppler, color Doppler, and power Doppler as needed of all accessible portions of each vessel. Bilateral testing is considered an integral part of a complete examination. Limited examinations for reoccurring indications may be performed as noted.  +----------+---------------+----------+-----------+-------------+ RIGHT TCD Right VM (cm/s)Depth (cm)Pulsatility   Comment    +----------+---------------+----------+-----------+-------------+ MCA             103                   1.05                  +----------+---------------+----------+-----------+-------------+ ACA             -62                   0.82                  +----------+---------------+----------+-----------+-------------+ Term ICA        93  0.93                  +----------+---------------+----------+-----------+-------------+ PCA P1          49                    0.95                  +----------+---------------+----------+-----------+-------------+ Opthalmic       18                    0.95                  +----------+---------------+----------+-----------+-------------+ ICA siphon                                    NOT INSONATED +----------+---------------+----------+-----------+-------------+ Vertebral       -30                   1.11                  +----------+---------------+----------+-----------+-------------+ Distal ICA      41                                          +----------+---------------+----------+-----------+-------------+  +----------+--------------+----------+-----------+-------+ LEFT TCD  Left VM (cm/s)Depth (cm)PulsatilityComment  +----------+--------------+----------+-----------+-------+ MCA            110                   1.07            +----------+--------------+----------+-----------+-------+ ACA            -62                   1.10            +----------+--------------+----------+-----------+-------+ Term ICA        67                   0.78            +----------+--------------+----------+-----------+-------+ PCA P1          16                   1.07            +----------+--------------+----------+-----------+-------+ Opthalmic       19                   1.43            +----------+--------------+----------+-----------+-------+ ICA siphon      54                   0.69            +----------+--------------+----------+-----------+-------+ Vertebral      -33                   1.01            +----------+--------------+----------+-----------+-------+ Distal ICA      35                                   +----------+--------------+----------+-----------+-------+  +------------+-------+-------+  VM cm/sComment +------------+-------+-------+ Dist Basilar  -30          +------------+-------+-------+ +----------------------+---+ Right Lindegaard Ratio2.5 +----------------------+---+ +---------------------+---+ Left Lindegaard Ratio3.1 +---------------------+---+  Summary:  Elevated bilateral middle cerebral aretry mean flow velocities suggets mild vasospasm. *See table(s) above for TCD measurements and observations.  Diagnosing physician: Eather Popp MD Electronically signed by Eather Popp MD on 06/11/2023 at 4:45:54 PM.    Final     Labs:  CBC: Recent Labs    06/10/23 0441 06/11/23 0555 06/12/23 0958 06/13/23 0705  WBC 4.3 4.5 7.5 8.8  HGB 10.8* 10.7* 10.5* 10.2*  HCT 33.1* 32.9* 32.0* 31.3*  PLT 289 313 367 440*    COAGS: Recent Labs    06/03/23 1353  INR 1.1    BMP: Recent Labs    06/10/23 0441 06/11/23 0555 06/12/23 0958 06/13/23 0705   NA 133* 135 137 136  K 4.1 4.0 3.8 3.7  CL 100 102 102 101  CO2 24 25 28 24   GLUCOSE 114* 136* 95 120*  BUN 16 10 12 17   CALCIUM 8.9 8.9 9.1 9.0  CREATININE 0.81 0.63 0.60 0.86  GFRNONAA >60 >60 >60 >60    LIVER FUNCTION TESTS: Recent Labs    04/03/23 1025 06/03/23 0148  BILITOT 0.6 0.4  AST 19 20  ALT 11 10  ALKPHOS 63 60  PROT 7.4 7.0  ALBUMIN 3.9 3.8    Assessment and Plan:  38 y.o. female inpatient. History of polysubstance abuse, schizophrenia, bipolar. Presented to the ED at [facility] on 12.30.24 with severe headache and AMS. Found to have a small bilateral frontal SAH. 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 No other aneurysm seen. S/p Primary coiling embolization performed with complete aneurysm occlusion.   Patient is neurologically stable and awaiting admission to inpatient psychiatry.  NIR will continue to follow.   Electronically Signed: Warren Dais, AGACNP-BC 06/13/2023, 11:54 AM   I spent a total of 15 Minutes at the the patient's bedside AND on the patient's hospital floor or unit, greater than 50% of which was counseling/coordinating care for [facility] s/p coiling.

## 2023-06-13 NOTE — Plan of Care (Signed)
  Problem: Safety: Goal: Ability to remain free from injury will improve Outcome: Progressing   Problem: Skin Integrity: Goal: Risk for impaired skin integrity will decrease Outcome: Progressing   Problem: Activity: Goal: Ability to tolerate increased activity will improve Outcome: Progressing   Problem: Role Relationship: Goal: Method of communication will improve Outcome: Progressing   Problem: Coping: Goal: Level of anxiety will decrease Outcome: Progressing   Problem: Health Behavior/Discharge Planning: Goal: Ability to safely manage health-related needs after discharge will improve Outcome: Progressing

## 2023-06-14 ENCOUNTER — Encounter (HOSPITAL_COMMUNITY): Payer: Self-pay

## 2023-06-14 DIAGNOSIS — F1090 Alcohol use, unspecified, uncomplicated: Secondary | ICD-10-CM | POA: Diagnosis present

## 2023-06-14 DIAGNOSIS — F1410 Cocaine abuse, uncomplicated: Secondary | ICD-10-CM | POA: Diagnosis present

## 2023-06-14 DIAGNOSIS — F1220 Cannabis dependence, uncomplicated: Secondary | ICD-10-CM | POA: Diagnosis present

## 2023-06-14 MED ORDER — LORAZEPAM 2 MG/ML IJ SOLN: 2.0000 mg | Freq: Three times a day (TID) | INTRAMUSCULAR | Status: DC | PRN

## 2023-06-14 MED ORDER — DIPHENHYDRAMINE HCL 25 MG PO CAPS: 50.0000 mg | ORAL_CAPSULE | Freq: Four times a day (QID) | ORAL | Status: DC | PRN

## 2023-06-14 MED ORDER — HALOPERIDOL LACTATE 5 MG/ML IJ SOLN: 10.0000 mg | Freq: Three times a day (TID) | INTRAMUSCULAR | Status: DC | PRN

## 2023-06-14 MED ORDER — DIPHENHYDRAMINE HCL 50 MG/ML IJ SOLN: 50.0000 mg | Freq: Four times a day (QID) | INTRAMUSCULAR | Status: DC | PRN

## 2023-06-14 MED ORDER — NICOTINE POLACRILEX 2 MG MT GUM: 2.0000 mg | CHEWING_GUM | OROMUCOSAL | Status: DC | PRN

## 2023-06-14 MED ORDER — DIPHENHYDRAMINE HCL 25 MG PO CAPS: 50.0000 mg | ORAL_CAPSULE | Freq: Three times a day (TID) | ORAL | Status: DC | PRN

## 2023-06-14 MED ORDER — TRAZODONE HCL 50 MG PO TABS: 50.0000 mg | ORAL_TABLET | Freq: Every evening | ORAL | Status: DC | PRN

## 2023-06-14 MED ORDER — DIPHENHYDRAMINE HCL 50 MG/ML IJ SOLN: 50.0000 mg | Freq: Three times a day (TID) | INTRAMUSCULAR | Status: DC | PRN

## 2023-06-14 MED ORDER — HALOPERIDOL 5 MG PO TABS: 5.0000 mg | ORAL_TABLET | Freq: Three times a day (TID) | ORAL | Status: DC | PRN

## 2023-06-14 MED ORDER — HYDROXYZINE HCL 25 MG PO TABS
25.0000 mg | ORAL_TABLET | Freq: Three times a day (TID) | ORAL | Status: DC | PRN
  Administered 2023-06-16: 25 mg via ORAL
  Filled 2023-06-14: qty 1

## 2023-06-14 MED ORDER — DIPHENHYDRAMINE HCL 50 MG/ML IJ SOLN
25.0000 mg | Freq: Every evening | INTRAMUSCULAR | Status: DC | PRN
Start: 2023-06-14 — End: 2023-06-14

## 2023-06-14 NOTE — Plan of Care (Signed)
   Problem: Coping: Goal: Ability to verbalize frustrations and anger appropriately will improve Outcome: Progressing   Problem: Safety: Goal: Periods of time without injury will increase Outcome: Progressing   Problem: Activity: Goal: Interest or engagement in activities will improve Outcome: Progressing

## 2023-06-14 NOTE — Progress Notes (Signed)
   06/14/23 0800  Psych Admission Type (Psych Patients Only)  Admission Status Involuntary  Psychosocial Assessment  Patient Complaints Anxiety;Depression  Eye Contact Fair  Facial Expression Animated  Affect Anxious;Depressed  Speech Logical/coherent  Interaction Assertive  Motor Activity Slow  Appearance/Hygiene Body odor;In scrubs  Mood Depressed;Anxious;Pleasant  Aggressive Behavior  Targets Property  Type of Behavior Striking out  Effect No apparent injury  Thought Process  Coherency WDL  Content Blaming others  Delusions None reported or observed  Perception WDL  Hallucination None reported or observed  Judgment Poor  Confusion Mild  Danger to Self  Current suicidal ideation? Denies  Agreement Not to Harm Self Yes  Description of Agreement Verbal  Danger to Others  Danger to Others None reported or observed

## 2023-06-14 NOTE — BH IP Treatment Plan (Signed)
 Interdisciplinary Treatment and Diagnostic Plan Update  06/14/2023 Time of Session: 11:25 AM Tiffany [name] MRN: [id number]  Principal Diagnosis: Bipolar affective disorder [facility])  Secondary Diagnoses: Principal Problem:   Bipolar affective disorder (HCC)   Current Medications:  Current Facility-Administered Medications  Medication Dose Route Frequency Provider Last Rate Last Admin   acetaminophen  (TYLENOL ) tablet 650 mg  650 mg Oral Q4H PRN Lenard Calin, MD       acetaminophen  (TYLENOL ) tablet 650 mg  650 mg Oral Q6H PRN Lenard Calin, MD       alum & mag hydroxide-simeth (MAALOX/MYLANTA) 200-200-20 MG/5ML suspension 30 mL  30 mL Oral Q4H PRN Lenard Calin, MD       haloperidol  lactate (HALDOL ) injection 5 mg  5 mg Intramuscular TID PRN Lenard Calin, MD       And   diphenhydrAMINE  (BENADRYL ) injection 50 mg  50 mg Intramuscular TID PRN Lenard Calin, MD       And   LORazepam  (ATIVAN ) injection 2 mg  2 mg Intramuscular TID PRN Lenard Calin, MD       haloperidol  (HALDOL ) tablet 5 mg  5 mg Oral Q8H PRN Fredia Dorothe HERO, MD       Or   haloperidol  lactate (HALDOL ) injection 5 mg  5 mg Intramuscular Q8H PRN Fredia Dorothe HERO, MD       magnesium  hydroxide (MILK OF MAGNESIA) suspension 30 mL  30 mL Oral Daily PRN Lenard Calin, MD       melatonin tablet 3 mg  3 mg Oral QHS Lenard Calin, MD   3 mg at 06/13/23 2142   multivitamin with minerals tablet 1 tablet  1 tablet Oral Daily Lenard Calin, MD   1 tablet at 06/14/23 9185   nicotine  (NICODERM CQ  - dosed in mg/24 hr) patch 7 mg  7 mg Transdermal Daily Lenard Calin, MD       nicotine  polacrilex (NICORETTE ) gum 2 mg  2 mg Oral PRN Trudy Carwin, NP       niMODipine  (NIMOTOP ) capsule 60 mg  60 mg Oral QID Rainey, Donovan, MD   60 mg at 06/14/23 9088   ondansetron  (ZOFRAN ) injection 4 mg  4 mg Intravenous Q6H PRN Lenard Calin, MD       polyethylene glycol (MIRALAX  / GLYCOLAX ) packet 17 g  17 g Per Tube Daily PRN  Lenard Calin, MD       risperiDONE  (RISPERDAL ) tablet 2 mg  2 mg Oral QHS Lenard Calin, MD   2 mg at 06/13/23 2142   thiamine  (Vitamin B-1) tablet 100 mg  100 mg Oral Daily Rainey, Donovan, MD   100 mg at 06/14/23 9185   traMADol  (ULTRAM ) tablet 50 mg  50 mg Oral Q6H PRN Lenard Calin, MD   50 mg at 06/14/23 1243   PTA Medications: Medications Prior to Admission  Medication Sig Dispense Refill Last Dose/Taking   acetaminophen  (TYLENOL ) 325 MG tablet Take 2 tablets (650 mg total) by mouth every 4 (four) hours as needed for mild pain (pain score 1-3) or moderate pain (pain score 4-6) (temp > 101.5).      haloperidol  (HALDOL ) 5 MG tablet Take 1 tablet (5 mg total) by mouth every 8 (eight) hours as needed for agitation.      haloperidol  lactate (HALDOL ) 5 MG/ML injection Inject 1 mL (5 mg total) into the muscle every 8 (eight) hours as needed (agitation).      melatonin 3 MG TABS tablet Take 1 tablet (3 mg total) by  mouth at bedtime.      Multiple Vitamin (MULTIVITAMIN WITH MINERALS) TABS tablet Take 1 tablet by mouth daily.      nicotine  (NICODERM CQ  - DOSED IN MG/24 HR) 7 mg/24hr patch Place 1 patch (7 mg total) onto the skin daily for 21 days.      niMODipine  (NIMOTOP ) 30 MG capsule Take 2 capsules (60 mg total) by mouth 4 (four) times daily for 12 days.      polyethylene glycol (MIRALAX  / GLYCOLAX ) 17 g packet Take 17 g by mouth daily.      risperiDONE  (RISPERDAL ) 2 MG tablet Take 1 tablet (2 mg total) by mouth at bedtime.      senna-docusate (SENOKOT-S) 8.6-50 MG tablet Take 1 tablet by mouth 2 (two) times daily.      thiamine  (VITAMIN B-1) 100 MG tablet Take 1 tablet (100 mg total) by mouth daily.      traMADol  (ULTRAM ) 50 MG tablet Take 1 tablet (50 mg total) by mouth every 8 (eight) hours as needed for up to 3 days for severe pain (pain score 7-10).       Patient Stressors: Financial difficulties   Health problems   Substance abuse    Patient Strengths: Active sense of humor   Capable of independent living  Communication skills   Treatment Modalities: Medication Management, Group therapy, Case management,  1 to 1 session with clinician, Psychoeducation, Recreational therapy.   Physician Treatment Plan for Primary Diagnosis: Bipolar affective disorder (HCC) Long Term Goal(s):     Short Term Goals:    Medication Management: Evaluate patient's response, side effects, and tolerance of medication regimen.  Therapeutic Interventions: 1 to 1 sessions, Unit Group sessions and Medication administration.  Evaluation of Outcomes: Not Progressing  Physician Treatment Plan for Secondary Diagnosis: Principal Problem:   Bipolar affective disorder (HCC)  Long Term Goal(s):     Short Term Goals:       Medication Management: Evaluate patient's response, side effects, and tolerance of medication regimen.  Therapeutic Interventions: 1 to 1 sessions, Unit Group sessions and Medication administration.  Evaluation of Outcomes: Not Progressing   RN Treatment Plan for Primary Diagnosis: Bipolar affective disorder (HCC) Long Term Goal(s): Knowledge of disease and therapeutic regimen to maintain health will improve  Short Term Goals: Ability to remain free from injury will improve, Ability to verbalize frustration and anger appropriately will improve, Ability to demonstrate self-control, Ability to participate in decision making will improve, Ability to verbalize feelings will improve, Ability to disclose and discuss suicidal ideas, Ability to identify and develop effective coping behaviors will improve, and Compliance with prescribed medications will improve  Medication Management: RN will administer medications as ordered by provider, will assess and evaluate patient's response and provide education to patient for prescribed medication. RN will report any adverse and/or side effects to prescribing provider.  Therapeutic Interventions: 1 on 1 counseling sessions,  Psychoeducation, Medication administration, Evaluate responses to treatment, Monitor vital signs and CBGs as ordered, Perform/monitor CIWA, COWS, AIMS and Fall Risk screenings as ordered, Perform wound care treatments as ordered.  Evaluation of Outcomes: Not Progressing   LCSW Treatment Plan for Primary Diagnosis: Bipolar affective disorder (HCC) Long Term Goal(s): Safe transition to appropriate next level of care at discharge, Engage patient in therapeutic group addressing interpersonal concerns.  Short Term Goals: Engage patient in aftercare planning with referrals and resources, Increase social support, Increase ability to appropriately verbalize feelings, Increase emotional regulation, Facilitate acceptance of mental health diagnosis and concerns, Facilitate  patient progression through stages of change regarding substance use diagnoses and concerns, and Identify triggers associated with mental health/substance abuse issues  Therapeutic Interventions: Assess for all discharge needs, 1 to 1 time with Social worker, Explore available resources and support systems, Assess for adequacy in community support network, Educate family and significant other(s) on suicide prevention, Complete Psychosocial Assessment, Interpersonal group therapy.  Evaluation of Outcomes: Not Progressing   Progress in Treatment: Attending groups: No. Participating in groups: No. Taking medication as prescribed: Yes. Toleration medication: Yes. Family/Significant other contact made:  consents pending Patient understands diagnosis: No. Discussing patient identified problems/goals with staff: Yes. Medical problems stabilized or resolved: No. Denies suicidal/homicidal ideation: Yes. Issues/concerns per patient self-inventory: No.  New problem(s) identified:  No  New Short Term/Long Term Goal(s):    medication stabilization, elimination of SI thoughts, development of comprehensive mental wellness plan.   Patient  Goals:  get back on my feet so I can go to the job interview and attend my 62 year old grandson's birthday party.  I'm not bi-polar or schizophrenic.  I had suicidal thoughts due to pain.  Discharge Plan or Barriers:  Patient recently admitted. CSW will continue to follow and assess for appropriate referrals and possible discharge planning.    Reason for Continuation of Hospitalization: Depression Medication stabilization Suicidal ideation  Estimated Length of Stay:  5 - 7 days  Last 3 [place] Suicide Severity Risk Score: Flowsheet Row Admission (Current) [facility]-Admission (Discharged) from 06/03/2023 in [facility] from 04/03/2023 in [facility] Emergency Department at [facility]  C-SSRS RISK CATEGORY No Risk No Risk No Risk       Last PHQ 2/9 Scores:     No data to display          Scribe for Treatment Team: Shanina Kepple O Caeleigh [place], LCSWA 06/14/2023 1:08 PM

## 2023-06-14 NOTE — H&P (Addendum)
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 for indigestion -Continue Milk of Magnesia as needed every 6 hrs for constipation  Labs Reviewed: Ordered TSH, hemoglobin A1c WNL from 01/04. Ordered  lipid panel & Vit D levels. Ordered  UA. EKG from 01/09 WNL.  Discharge Planning: Social work and case management to assist with discharge planning and identification of hospital follow-up needs prior to discharge Estimated LOS: 5-7 days Discharge Concerns: Need to establish a safety plan; Medication compliance and effectiveness Discharge Goals: Return home with outpatient referrals for mental health follow-up including medication management/psychotherapy  I certify that inpatient services furnished can reasonably be expected to improve the patient's condition.    Donia Snell, NP 1/10/20255:23 PM

## 2023-06-14 NOTE — Progress Notes (Signed)
 Patient complained of headache this morning and when was given tylenol she walked away saying "you guys are playing with me, I take 2 brown little pills" patient refused tylenol

## 2023-06-14 NOTE — Plan of Care (Signed)
   Problem: Education: Goal: Knowledge of [facility] Education information/materials will improve Outcome: Progressing Goal: Emotional status will improve Outcome: Progressing Goal: Mental status will improve Outcome: Progressing Goal: Verbalization of understanding the information provided will improve Outcome: Progressing

## 2023-06-14 NOTE — [facility] Suicide Risk Assessment (Signed)
 Suicide Risk Assessment  Admission Assessment    [facility] Admission Suicide Risk Assessment   Nursing information obtained from:  Patient Demographic factors:  Gay, lesbian, or bisexual orientation, Unemployed, Low socioeconomic status Current Mental Status:  NA Loss Factors:  Loss of significant relationship, Financial problems / change in socioeconomic status, Decline in physical health Historical Factors:  Impulsivity Risk Reduction Factors:  Living with another person, especially a relative  Total Time spent with patient: 1.5 hours Principal Problem: Schizoaffective disorder, bipolar type (HCC) Diagnosis:  Principal Problem:   Schizoaffective disorder, bipolar type (HCC) Active Problems:   SAH (subarachnoid hemorrhage) (HCC)   Cocaine use disorder (HCC)   Delta-9-tetrahydrocannabinol (THC) dependence (HCC)   Alcohol use disorder  Subjective Data: Psychosis & SI  Continued Clinical Symptoms:  depressed mood,irritability anhedonia, insomnia, fatigue, feelings of worthlessness/guilt, difficulty concentrating, hopelessness, impaired memory, anxiety, loss of energy/fatigue, disturbed sleep, decreased appetite, (Hypo) Manic Symptoms:  Delusions, Distractibility, Hallucinations, Impulsivity, Irritable Mood, Labiality of Mood, Sexually Inapproprite Behavior, Anxiety Symptoms:  Excessive Worry, Psychotic Symptoms:  Delusions, Hallucinations: Auditory Paranoia, Alcohol Use Disorder Identification Test Final Score (AUDIT): 6 The Alcohol Use Disorders Identification Test, Guidelines for Use in Primary Care, Second Edition.  [employer] [facility]). Score between 0-7:  no or low risk or alcohol related problems. Score between 8-15:  moderate risk of alcohol related problems. Score between 16-19:  high risk of alcohol related problems. Score 20 or above:  warrants further diagnostic evaluation for alcohol dependence and treatment.  CLINICAL FACTORS:    Alcohol/Substance Abuse/Dependencies Schizophrenia:   Paranoid or undifferentiated type Previous Psychiatric Diagnoses and Treatments  Musculoskeletal: Strength & Muscle Tone: within normal limits Gait & Station: normal Patient leans: N/A  Psychiatric Specialty Exam:  Presentation  General Appearance:  Disheveled  Eye Contact: Poor; Fair  Speech: Pressured  Speech Volume: Normal  Handedness: Right   Mood and Affect  Mood: Angry; Anxious; Depressed; Irritable; Labile  Affect: Congruent   Thought Process  Thought Processes: Disorganized  Descriptions of Associations:Intact  Orientation:Partial  Thought Content:Illogical  History of Schizophrenia/Schizoaffective disorder:No data recorded Duration of Psychotic Symptoms:No data recorded Hallucinations:Hallucinations: Auditory  Ideas of Reference:Paranoia; Delusions; Percusatory  Suicidal Thoughts:Suicidal Thoughts: No  Homicidal Thoughts:Homicidal Thoughts: No   Sensorium  Memory: Immediate Good; Recent Good  Judgment: Poor  Insight: Poor   Executive Functions  Concentration: Poor  Attention Span: Poor  Recall: Poor  Fund of Knowledge: Poor  Language: Poor   Psychomotor Activity  Psychomotor Activity: Psychomotor Activity: Psychomotor Retardation   Assets  Assets: Resilience   Sleep  Sleep: Sleep: Poor    Physical Exam: Physical Exam Neurological:     General: No focal deficit present.    Review of Systems  Psychiatric/Behavioral:  Positive for depression, hallucinations and substance abuse. Negative for memory loss and suicidal ideas. The patient is nervous/anxious and has insomnia.   All other systems reviewed and are negative.  Blood pressure 102/62, pulse 70, temperature 98.1 F (36.7 C), temperature source Oral, resp. rate 18, height 5' 6 (1.676 m), weight 61.7 kg, last menstrual period 05/11/2023, SpO2 100%, unknown if currently breastfeeding. Body mass  index is 21.95 kg/m.   COGNITIVE FEATURES THAT CONTRIBUTE TO RISK:  Thought constriction (tunnel vision)    SUICIDE RISK:   Severe:  Frequent, intense, and enduring suicidal ideation, specific plan, no subjective intent, but some objective markers of intent (i.e., choice of lethal method), the method is accessible, some limited preparatory behavior, evidence of impaired self-control, severe dysphoria/symptomatology, multiple  risk factors present, and few if any protective factors, particularly a lack of social support.  PLAN OF CARE: See H & P  I certify that inpatient services furnished can reasonably be expected to improve the patient's condition.   Donia Snell, NP 06/14/2023, 5:22 PM

## 2023-06-14 NOTE — Group Note (Signed)
 Date:  06/14/2023 Time:  9:16 PM  Group Topic/Focus:  Wrap-Up Group:   The focus of this group is to help patients review their daily goal of treatment and discuss progress on daily workbooks.    Participation Level:  Active  Participation Quality:  Appropriate  Affect:  Appropriate  Cognitive:  Appropriate  Insight: Appropriate  Engagement in Group:  Engaged  Modes of Intervention:  Education  Additional Comments:  Patient attended and participated in group tonight.  She reports that she tried giving herself an arosement which did not went well. She started having a headache and is still having a headache. This clinical research associate to advised her to talk to her nurse about it.  Gwenn Chillington Dacosta 06/14/2023, 9:16 PM

## 2023-06-14 NOTE — Progress Notes (Signed)
   06/14/23 2015  Psych Admission Type (Psych Patients Only)  Admission Status Involuntary  Psychosocial Assessment  Patient Complaints Anxiety;Depression  Eye Contact Fair  Facial Expression Animated  Affect Anxious  Speech Logical/coherent  Interaction Assertive  Motor Activity Slow  Appearance/Hygiene Disheveled  Behavior Characteristics Cooperative  Mood Depressed  Aggressive Behavior  Effect No apparent injury  Thought Process  Coherency Circumstantial  Content Blaming others  Delusions WDL  Perception WDL  Hallucination None reported or observed  Judgment Poor  Confusion Mild  Danger to Self  Current suicidal ideation? Denies

## 2023-06-14 NOTE — Plan of Care (Signed)
   Problem: Education: Goal: Emotional status will improve Outcome: Progressing Goal: Mental status will improve Outcome: Progressing   Problem: Activity: Goal: Interest or engagement in activities will improve Outcome: Progressing Goal: Sleeping patterns will improve Outcome: Progressing

## 2023-06-14 NOTE — Group Note (Signed)
 Recreation Therapy Group Note   Group Topic:Health and Wellness  Group Date: 06/14/2023 Start Time: 0930 End Time: 1007 Facilitators: Hibba Schram-McCall, LRT,CTRS Location: [address] Day[address]   Group Topic: Wellness  Goal Area(s) Addresses:  Patient will define components of whole wellness. Patient will verbalize benefit of whole wellness.  Group Description: LRT and patients discussed the importance of exercise and its affect on your body and lifestyle. Patients took turns leading the group in the exercises of their choosing. Patients also chose the music they wanted to exercise to as well.    Education: Wellness, Building Control Surveyor.   Education Outcome: Acknowledges education/In group clarification offered/Needs additional education.    Affect/Mood: N/A   Participation Level: Did not attend    Clinical Observations/Individualized Feedback:     Plan: Continue to engage patient in RT group sessions 2-3x/week.   Vi Biddinger-McCall, LRT,CTRS 06/14/2023 12:05 PM

## 2023-06-15 LAB — LIPID PANEL
Cholesterol: 188 mg/dL (ref 0–200)
HDL: 72 mg/dL (ref >40)
LDL Cholesterol: 91 mg/dL (ref 0–99)
Total CHOL/HDL Ratio: 2.6 {ratio}
Triglycerides: 127 mg/dL (ref <150)
VLDL: 25 mg/dL (ref 0–40)

## 2023-06-15 LAB — TSH: TSH: 3.47 u[IU]/mL (ref 0.350–4.500)

## 2023-06-15 LAB — VITAMIN D 25 HYDROXY (VIT D DEFICIENCY, FRACTURES): Vit D, 25-Hydroxy: 14.1 ng/mL — ABNORMAL LOW (ref 30–100)

## 2023-06-15 MED ORDER — MELATONIN 5 MG PO TABS
5.0000 mg | ORAL_TABLET | Freq: Every day | ORAL | Status: DC
  Administered 2023-06-15 – 2023-06-16 (×2): 5 mg via ORAL
  Filled 2023-06-15 (×4): qty 1

## 2023-06-15 NOTE — Group Note (Signed)
 Date:  06/15/2023 Time:  9:23 PM  Group Topic/Focus:  Wrap-Up Group:   The focus of this group is to help patients review their daily goal of treatment and discuss progress on daily workbooks.    Participation Level:  Active  Participation Quality:  Appropriate  Affect:  Appropriate  Cognitive:  Appropriate  Insight: Appropriate  Engagement in Group:  Engaged  Modes of Intervention:  Education and Exploration  Additional Comments:  Patient attended and participated in group tonight. She reports that she likes that she was brave. When she get discharge she will get her hair done.  Tiffany [name] 06/15/2023, 9:23 PM

## 2023-06-15 NOTE — Progress Notes (Signed)
   06/15/23 2300  Psych Admission Type (Psych Patients Only)  Admission Status Involuntary  Psychosocial Assessment  Patient Complaints Anxiety  Eye Contact Brief  Facial Expression Animated  Affect Appropriate to circumstance;Anxious  Speech Logical/coherent  Interaction Assertive  Motor Activity Slow  Appearance/Hygiene Disheveled  Behavior Characteristics Appropriate to situation;Anxious  Mood Anxious;Pleasant  Aggressive Behavior  Effect No apparent injury  Thought Process  Coherency WDL  Content WDL  Delusions None reported or observed  Perception WDL  Hallucination None reported or observed  Judgment Poor  Confusion None  Danger to Self  Current suicidal ideation? Denies  Danger to Others  Danger to Others None reported or observed

## 2023-06-15 NOTE — Progress Notes (Signed)
 [facility] MD Progress Note  06/15/2023 9:51 AM Tiffany [name]  MRN:  [id number]   Reason for Admission:  Patient is a 38 year old African-American female with prior mental health diagnoses of schizophrenia, polysubstance abuse, including EtOH, THC & cocaine who initially presented to the [facility] ED on 12/30 with complaints of a headache, was diagnosed with a subarachnoid hemorrhage. She was medically treated and cleared prior to being transferred and admitted under IVC status to this [facility] on 1/09 for stabilization of her mental status.  The patient is currently on Hospital Day 2.   Chart Review from last 24 hours:  The patient's chart was reviewed and nursing notes were reviewed. The patient's case was discussed in multidisciplinary team meeting. Per [facility] patient is compliant with her scheduled medications, no as needed medication given for agitation but patient was transferred on 1/10-500 hold for being disruptive.  Information Obtained Today During Patient Interview: The patient was seen and evaluated on the unit. On assessment today the patient reports poor sleep, she denies SI HI or AVH and denies feeling depressed, she is preoccupied with wanting to be discharged, she provides name of her friend Ozell Sharps as a source of collateral information with telephone [phone], she tells me she is planning to go back to stay with her roommate in [place].  She denies side effect to current medications.  She seems to have very limited insight to having subarachnoid hemorrhage prior to being here, I redirected her regarding need to follow with neurology after discharge as recommended by neurosurgery service who evaluated her in the hospital.  She denies any craving to illicit drug use and I counseled her regarding need to abstain completely from illicit drugs after discharge.  She denies feeling depressed and denies any symptoms consistent with mania, does not present manic but seems slightly confused yet  alert and oriented.  With patient's verbal permission I spoke to her friend Ozell Sharps at [phone] who reports that they have been friends for 14 years and last time he spoke to her was 2 days ago he describes that phone call that she was at her baseline except for being depressed but denies any concern about her safety if discharged home he denies her having access to guns and confirms that she lives with roommate in an apartment, he confirms that he helps her with making follow-up appointments.  He expressed his plan to visit with her in the hospital today or tomorrow.  I plan to continue to monitor and patient during hospital stay and discharge her tomorrow or Monday morning, she will need follow-up appointment with neurosurgery as recommended by neurosurgery consult in the hospital with (to see Dr. Lanis in the office in a few weeks).  Sleep  Sleep:Sleep: Poor   Principal Problem: Schizoaffective disorder, bipolar type (HCC) Diagnosis: Principal Problem:   Schizoaffective disorder, bipolar type (HCC) Active Problems:   SAH (subarachnoid hemorrhage) (HCC)   Cocaine use disorder (HCC)   Delta-9-tetrahydrocannabinol (THC) dependence (HCC)   Alcohol use disorder    Past Psychiatric History:  Previous Psych Diagnoses: Schizoaffective disorder, bipolar type.  Polysubstance abuse. Prior inpatient treatment: Denies, but as per chart review, admitted on 10/20/2011 for HI towards her mother in the context of substance abuse. -Patient also has another mental health related hospitalization on 11/22/2010. Current/prior outpatient treatment: Denies Prior rehab hx: None Psychotherapy hx: Denies History of suicide attempts: Denies History of homicide or aggression: Denies any history of aggression, but as per chart review, and as  per collateral information from patient's sister, she tends to get very aggressive at home to her family members and to strangers as well.   Psychiatric  medication history: Denies ever being on psychotropic medications Psychiatric medication compliance history: Chronically noncompliant Neuromodulation history: None Current Psychiatrist: None Current therapist: None  Past Medical History:  Past Medical History:  Diagnosis Date   Addiction, marijuana (HCC)    Alcohol abuse    Chlamydia    Cocaine addiction (HCC)    Depression    History of concussion    Hypertension    does not take any meds    Past Surgical History:  Procedure Laterality Date   IR 3D INDEPENDENT WKST  06/03/2023   IR ANGIO INTRA EXTRACRAN SEL INTERNAL CAROTID BILAT MOD SED  06/03/2023   IR ANGIO VERTEBRAL SEL VERTEBRAL BILAT MOD SED  06/03/2023   IR ANGIOGRAM FOLLOW UP STUDY  06/03/2023   IR ANGIOGRAM FOLLOW UP STUDY  06/03/2023   IR ANGIOGRAM FOLLOW UP STUDY  06/03/2023   IR NEURO EACH ADD'L AFTER BASIC UNI LEFT (MS)  06/03/2023   IR TRANSCATH/EMBOLIZ  06/03/2023   IR US  GUIDE VASC ACCESS RIGHT  06/03/2023   RADIOLOGY WITH ANESTHESIA N/A 06/03/2023   Procedure: IR WITH ANESTHESIA;  Surgeon: de Macedo Rodrigues, Katyucia, MD;  Location: [facility] OR;  Service: Radiology;  Laterality: N/A;   WISDOM TOOTH EXTRACTION  2007   Family History:  Family History  Problem Relation Age of Onset   Hypertension Other    Diabetes Other    Heart failure Mother    Hypertension Mother    Cervical cancer Paternal Aunt    Family Psychiatric  History: Unknown Social History:  Patient reports that she resides with a roommate who is autistic.  Reports that she has 1 child, who is 23 years old, and a granddaughter who is 53-year-old.  Reports that she was born and raised in [place], [place].  Reports highest level of education as being some college.  Reports that her parents are still alive and are supportive of her.  She reports that she has a court case, but refuses to discuss the specifics of the court case, states that she would like to be discharged so that she can get into the case.   Also stating that she has a job interview, and that she wants to go home to her grandchild.    Patient educated that she is involuntarily committed, and will need to stay hospitalized until her treatment team deems that she is well enough to be discharged.  Patient still in the hall on the day [address] [place] screaming at the top of her lungs, and being very irrational, difficult to redirect, and disruptive to the milieu, requiring a transfer to the [address].   Patient appears very disheveled, hair looks unkempt, and she is malodorous, seems not to have attending to personal hygiene and grooming for a while. Pt is currently oriented to person, place, time but disoriented to situation.   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 Finances: A stressor Legal: States that she has medical issues, but refuses to discuss them. Military: Denies   Current Medications: Current Facility-Administered Medications  Medication Dose Route Frequency Provider Last Rate Last Admin   acetaminophen  (TYLENOL ) tablet 650 mg  650 mg Oral Q4H PRN Lenard Calin, MD       acetaminophen  (TYLENOL )  tablet 650 mg  650 mg Oral Q6H PRN Lenard Calin, MD   650 mg at 06/14/23 1812   alum & mag hydroxide-simeth (MAALOX/MYLANTA) 200-200-20 MG/5ML suspension 30 mL  30 mL Oral Q4H PRN Lenard Calin, MD       haloperidol  (HALDOL ) tablet 5 mg  5 mg Oral TID PRN McLauchlin, Angela, NP       And   diphenhydrAMINE  (BENADRYL ) capsule 50 mg  50 mg Oral TID PRN McLauchlin, Angela, NP       diphenhydrAMINE  (BENADRYL ) capsule 50 mg  50 mg Oral Q6H PRN Tex Drilling, NP       Or   diphenhydrAMINE  (BENADRYL ) injection 50 mg  50 mg Intramuscular Q6H PRN Tex Drilling, NP       haloperidol  lactate (HALDOL ) injection 10 mg  10 mg Intramuscular TID PRN McLauchlin, Jon, NP       And    diphenhydrAMINE  (BENADRYL ) injection 50 mg  50 mg Intramuscular TID PRN McLauchlin, Jon, NP       And   LORazepam  (ATIVAN ) injection 2 mg  2 mg Intramuscular TID PRN McLauchlin, Angela, NP       haloperidol  (HALDOL ) tablet 5 mg  5 mg Oral Q8H PRN Fredia Dorothe HERO, MD       Or   haloperidol  lactate (HALDOL ) injection 5 mg  5 mg Intramuscular Q8H PRN Fredia Dorothe HERO, MD       haloperidol  lactate (HALDOL ) injection 5 mg  5 mg Intramuscular TID PRN Lenard Calin, MD       And   LORazepam  (ATIVAN ) injection 2 mg  2 mg Intramuscular TID PRN Lenard Calin, MD       hydrOXYzine  (ATARAX ) tablet 25 mg  25 mg Oral TID PRN McLauchlin, Angela, NP       magnesium  hydroxide (MILK OF MAGNESIA) suspension 30 mL  30 mL Oral Daily PRN Lenard Calin, MD       melatonin tablet 3 mg  3 mg Oral QHS Lenard Calin, MD   3 mg at 06/14/23 2031   multivitamin with minerals tablet 1 tablet  1 tablet Oral Daily Lenard Calin, MD   1 tablet at 06/15/23 9091   nicotine  (NICODERM CQ  - dosed in mg/24 hr) patch 7 mg  7 mg Transdermal Daily Lenard Calin, MD   7 mg at 06/15/23 9091   nicotine  polacrilex (NICORETTE ) gum 2 mg  2 mg Oral PRN Trudy Carwin, NP       niMODipine  (NIMOTOP ) capsule 60 mg  60 mg Oral QID Lenard Calin, MD   60 mg at 06/15/23 9090   ondansetron  (ZOFRAN ) injection 4 mg  4 mg Intravenous Q6H PRN Lenard Calin, MD       polyethylene glycol (MIRALAX  / GLYCOLAX ) packet 17 g  17 g Per Tube Daily PRN Lenard Calin, MD       risperiDONE  (RISPERDAL ) tablet 2 mg  2 mg Oral QHS Lenard Calin, MD   2 mg at 06/14/23 2031   thiamine  (Vitamin B-1) tablet 100 mg  100 mg Oral Daily Rainey, Donovan, MD   100 mg at 06/15/23 9090   traMADol  (ULTRAM ) tablet 50 mg  50 mg Oral Q6H PRN Lenard Calin, MD   50 mg at 06/15/23 0350   traZODone  (DESYREL ) tablet 50 mg  50 mg Oral QHS PRN McLauchlin, Jon, NP        Lab Results:  Results for orders placed or performed during the hospital encounter of  06/13/23 (from the past 48  hours)  Glucose, capillary     Status: None   Collection Time: 06/13/23  9:34 PM  Result Value Ref Range   Glucose-Capillary 98 70 - 99 mg/dL    Comment: Glucose reference range applies only to samples taken after fasting for at least 8 hours.  Lipid panel     Status: None   Collection Time: 06/15/23  6:44 AM  Result Value Ref Range   Cholesterol 188 0 - 200 mg/dL   Triglycerides 872 <849 mg/dL   HDL 72 >59 mg/dL   Total CHOL/HDL Ratio 2.6 RATIO   VLDL 25 0 - 40 mg/dL   LDL Cholesterol 91 0 - 99 mg/dL    Comment:        Total Cholesterol/HDL:CHD Risk Coronary Heart Disease Risk Table                     Men   Women  1/2 Average Risk   3.4   3.3  Average Risk       5.0   4.4  2 X Average Risk   9.6   7.1  3 X Average Risk  23.4   11.0        Use the calculated Patient Ratio above and the CHD Risk Table to determine the patient's CHD Risk.        ATP III CLASSIFICATION (LDL):  <100     mg/dL   Optimal  899-870  mg/dL   Near or Above                    Optimal  130-159  mg/dL   Borderline  839-810  mg/dL   High  >809     mg/dL   Very High Performed at [facility], [address], [place], [place] [address]   TSH     Status: None   Collection Time: 06/15/23  6:44 AM  Result Value Ref Range   TSH 3.470 0.350 - 4.500 uIU/mL    Comment: Performed by a 3rd Generation assay with a functional sensitivity of <=0.01 uIU/mL. Performed at [facility], [address], [place], [place] [address]     Blood Alcohol level:  Lab Results  Component Value Date   [facility] <10 06/03/2023   ETH <10 04/03/2023    Metabolic Disorder Labs: Lab Results  Component Value Date   HGBA1C 5.6 06/08/2023   MPG 114 06/08/2023   No results found for: PROLACTIN Lab Results  Component Value Date   CHOL 188 06/15/2023   TRIG 127 06/15/2023   HDL 72 06/15/2023   CHOLHDL 2.6 06/15/2023   VLDL 25 06/15/2023   LDLCALC 91 06/15/2023     Physical Findings: AIMS:  , ,  ,  ,    CIWA:    COWS:     Musculoskeletal: Strength & Muscle Tone: within normal limits Gait & Station: normal Patient leans: N/A  Psychiatric Specialty Exam:  General Appearance: Disheveled and unkempt  Behavior: Guarded and irritable  Psychomotor Activity:No psychomotor agitation or retardation noted   Eye Contact: Limited Speech: Decreased amount   Mood: Dysphoric and irritable Affect: Congruent  Thought Process: Linear yet concrete Descriptions of Associations: Intact Thought Content: Preoccupied with wanting discharge Hallucinations: Denies AH, VH, does not appear responding to stimuli Delusions: No paranoia  Suicidal Thoughts: Denies passive or active SI, intention, plan  Homicidal Thoughts: Denies HI, intention, plan   Alertness/Orientation: Alert and oriented to person place and  time, limited orientation to situation  Insight: Limited Judgment: Limited  Memory: Personnel Officer  Concentration: Limited Attention Span: Limited Recall: Limited Fund of Knowledge: Limited   Assets  Assets: Resilience Access to care   Physical Exam: Physical Exam ROS Blood pressure 103/74, pulse 79, temperature 98.2 F (36.8 C), temperature source Oral, resp. rate 18, height 5' 6 (1.676 m), weight 61.7 kg, last menstrual period 05/11/2023, SpO2 100%, unknown if currently breastfeeding. Body mass index is 21.95 kg/m.   Treatment Plan Summary: Daily contact with patient to assess and evaluate symptoms and progress in treatment and Medication management  ASSESSMENT:  Diagnoses / Active Problems: Principal Problem: Schizoaffective disorder, bipolar type (HCC) Diagnosis: Principal Problem:   Schizoaffective disorder, bipolar type (HCC) Active Problems:   SAH (subarachnoid hemorrhage) (HCC)   Cocaine use disorder (HCC)   Delta-9-tetrahydrocannabinol (THC) dependence (HCC)   Alcohol use disorder   PLAN: Safety  and Monitoring:  -- Involuntary admission to inpatient psychiatric unit for safety, stabilization and treatment  -- Daily contact with patient to assess and evaluate symptoms and progress in treatment  -- Patient's case to be discussed in multi-disciplinary team meeting  -- Observation Level : q15 minute checks  -- Vital signs:  q12 hours  -- Precautions: suicide, elopement, and assault  2. Medications:   Continue risperidone  2 mg at bedtime for mood and psychosis  Titrate melatonin from 3 to 5 mg at bedtime for sleep  Continue trazodone  as needed for sleep and Vistaril  as needed for anxiety  Continue amlodipine 60 mg 4 times daily until 1/21 as per hospitalist recommendations at time of discharge   --  The risks/benefits/side-effects/alternatives to this medication were discussed in detail with the patient and time was given for questions. The patient consents to medication trial.    -- Metabolic profile and EKG monitoring obtained while on an atypical antipsychotic (BMI: Lipid Panel: HbgA1c: QTc:)      3. Pertinent labs: Reviewed      Lab ordered: None   4. Tobacco Use Disorder  -- Nicotine  patch 21mg /24 hours ordered  -- Smoking cessation encouraged  5. Group and Therapy: -- Encouraged patient to participate in unit milieu and in scheduled group therapies     Patient was counseled regarding need to abstain completely from illicit drug use after discharge, she agrees  -- Short Term Goals: Ability to identify changes in lifestyle to reduce recurrence of condition will improve, Ability to verbalize feelings will improve, Ability to disclose and discuss suicidal ideas, and Ability to demonstrate self-control will improve  -- Long Term Goals: Improvement in symptoms so as ready for discharge  6. Discharge Planning:   -- Social work and case management to assist with discharge planning and identification of hospital follow-up needs prior to discharge  -- Estimated LOS: 5-7 days  --  Discharge Concerns: Need to establish a safety plan; Medication compliance and effectiveness  -- Discharge Goals: Return home with outpatient referrals for mental health follow-up including medication management/psychotherapy      Total Time Spent in Direct Patient Care:  I personally spent 35 minutes on the unit in direct patient care. The direct patient care time included face-to-face time with the patient, reviewing the patient's chart, communicating with other professionals, and coordinating care. Greater than 50% of this time was spent in counseling or coordinating care with the patient regarding goals of hospitalization, psycho-education, and discharge planning needs.   Gwen Sarvis Evelena, MD 06/15/2023, 9:51 AM

## 2023-06-15 NOTE — Progress Notes (Signed)
 Patient denies SI, HI, and AVH this shift. Patient had complaints of headache and sensitivity to light. Patient has had no incidents of behavioral dyscontrol.   Assess patient for safety, offer medications as prescribed, engage patient in 1:1 staff talks.   Patient able to contract for safety. Continue to monitor as planned.

## 2023-06-16 DIAGNOSIS — F250 Schizoaffective disorder, bipolar type: Secondary | ICD-10-CM

## 2023-06-16 NOTE — Plan of Care (Signed)
  Problem: Physical Regulation: Goal: Ability to maintain clinical measurements within normal limits will improve Outcome: Progressing   Problem: Education: Goal: Will be free of psychotic symptoms Outcome: Progressing   Problem: Education: Goal: Knowledge of the prescribed therapeutic regimen will improve Outcome: Progressing   Problem: Role Relationship: Goal: Ability to communicate needs accurately will improve Outcome: Progressing   Problem: Role Relationship: Goal: Ability to interact with others will improve Outcome: Progressing

## 2023-06-16 NOTE — Plan of Care (Addendum)
  Problem: Physical Regulation: Goal: Ability to maintain clinical measurements within normal limits will improve Outcome: Progressing   Problem: Education: Goal: Will be free of psychotic symptoms Outcome: Progressing   Problem: Education: Goal: Knowledge of the prescribed therapeutic regimen will improve 06/16/2023 1738 by Myra Curtistine BROCKS, RN Outcome: Progressing  Problem: Role Relationship: Goal: Ability to interact with others will improve 06/16/2023 1738 by Myra Curtistine BROCKS, RN Outcome: Progressing   Problem: Coping: Goal: Coping ability will improve Outcome: Progressing
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
## 2023-06-16 NOTE — Progress Notes (Signed)
   06/16/23 0915  Psych Admission Type (Psych Patients Only)  Admission Status Involuntary  Psychosocial Assessment  Patient Complaints Anxiety  Eye Contact Fair  Facial Expression Animated  Affect Appropriate to circumstance  Speech Logical/coherent  Interaction Assertive  Motor Activity Other (Comment) (WNL)  Appearance/Hygiene Disheveled  Behavior Characteristics Appropriate to situation  Mood Pleasant  Thought Process  Coherency WDL  Content WDL  Delusions None reported or observed  Perception WDL  Hallucination None reported or observed  Judgment Limited  Confusion None  Danger to Self  Current suicidal ideation? Denies  Agreement Not to Harm Self Yes  Description of Agreement verbal  Danger to Others  Danger to Others None reported or observed

## 2023-06-16 NOTE — BHH Group Notes (Signed)
 Adult Psychoeducational Group Note  Date:  06/16/2023 Time:  10:30 AM  Group Topic/Focus:  Goals Group:   The focus of this group is to help patients establish daily goals to achieve during treatment and discuss how the patient can incorporate goal setting into their daily lives to aide in recovery. Orientation:   The focus of this group is to educate the patient on the purpose and policies of crisis stabilization and provide a format to answer questions about their admission.  The group details unit policies and expectations of patients while admitted.  Participation Level:  Active  Participation Quality:  Appropriate  Affect:  Appropriate  Cognitive:  Appropriate  Insight: Appropriate  Engagement in Group:  Engaged  Modes of Intervention:  Discussion  Additional Comments:  Pt attended the goals group and remained appropriate and engaged throughout the duration of the group.   [name] O 06/16/2023, 10:30 AM

## 2023-06-16 NOTE — Plan of Care (Signed)
   Problem: Education: Goal: Emotional status will improve Outcome: Progressing Goal: Mental status will improve Outcome: Progressing   Problem: Activity: Goal: Interest or engagement in activities will improve Outcome: Progressing   Problem: Safety: Goal: Periods of time without injury will increase Outcome: Progressing

## 2023-06-16 NOTE — BHH Suicide Risk Assessment (Signed)
 [facility] INPATIENT:  Family/Significant Other Suicide Prevention Education  Suicide Prevention Education:  Education Completed; Ozell Imus (Long term Friend) [phone] has been identified by the patient as the family member/significant other with whom the patient will be residing, and identified as the person(s) who will aid the patient in the event of a mental health crisis (suicidal ideations/suicide attempt).  With written consent from the patient, the family member/significant other has been provided the following suicide prevention education, prior to the and/or following the discharge of the patient.  The suicide prevention education provided includes the following: Suicide risk factors Suicide prevention and interventions National Suicide Hotline telephone number [facility] assessment telephone number [facility] Emergency Assistance 911 [facility] and/or Residential Mobile Crisis Unit telephone number  Request made of family/significant other to: Remove weapons (e.g., guns, rifles, knives), all items previously/currently identified as safety concern.   Remove drugs/medications (over-the-counter, prescriptions, illicit drugs), all items previously/currently identified as a safety concern.  The family member/significant other verbalizes understanding of the suicide prevention education information provided.  The family member/significant other agrees to remove the items of safety concern listed above.  Patient does not have access to guns, however she carries a pocketknife for safety.   Rolonda Pontarelli L Ramil Edgington LCSWA 06/16/2023, 11:50 AM

## 2023-06-16 NOTE — Group Note (Signed)
 Date:  06/16/2023 Time:  9:56 PM  Group Topic/Focus:  Wrap-Up Group:   The focus of this group is to help patients review their daily goal of treatment and discuss progress on daily workbooks.    Participation Level:  Active  Participation Quality:  Appropriate  Affect:  Appropriate  Cognitive:  Appropriate  Insight: Appropriate  Engagement in Group:  Engaged  Modes of Intervention:  Education and Exploration  Additional Comments:  Patient attended and participated in group tonight. She stated her goal was to stop cursing which she did today  Krimson Massmann Dacosta 06/16/2023, 9:56 PM

## 2023-06-16 NOTE — Progress Notes (Signed)
 [facility] MD Progress Note  06/16/2023 10:03 AM Tiffany [name]  MRN:  [id number]   Reason for Admission:  Patient is a 38 year old African-American female with prior mental health diagnoses of schizophrenia, polysubstance abuse, including EtOH, THC & cocaine who initially presented to the [facility] ED on 12/30 with complaints of a headache, was diagnosed with a subarachnoid hemorrhage. She was medically treated and cleared prior to being transferred and admitted under IVC status to this [facility] on 1/09 for stabilization of her mental status.  The patient is currently on Hospital Day 3.   Chart Review from last 24 hours:  The patient's chart was reviewed and nursing notes were reviewed. The patient's case was discussed in multidisciplinary team meeting. Per [facility] patient is compliant with her scheduled medications, no as needed medication given for agitation.  Was reported by staff to be cooperative and pleasant, reported headache this morning, received Tylenol  as needed.  Information Obtained Today During Patient Interview: The patient was seen and evaluated on the unit. On assessment today the patient reports fair sleep last night, fair appetite.  She continues to deny SI HI or AVH, denies feeling hopeless or helpless, denies feeling depressed or anxious.  She presents pleasant and cheerful today.  Denies paranoia or other delusions, does not appear responding to stimuli or psychotic.  She reports consistently that her friend Ozell Sharps helps her with making outpatient follow-up appointment which was confirmed after calling her friend yesterday.  She also consistently report that she will stay with her roommate after discharge, she does admit to using street drugs on and off, denies craving and agrees to comply with recommendation to abstain completely after discharge.  She has better insight today understanding need to follow-up with psychiatric provider as well as follow-up for neurosurgery for subarachnoid  hemorrhage, she asks linear appropriate questions regarding her appointments, plan is to make her appointments tomorrow morning prior to her discharge, plan to discharge her tomorrow if continues to do well.  Patient denies side effect to medications and agrees to comply with medications after discharge.  On 1/11, with patient's verbal permission I spoke to her friend Ozell Sharps at [phone] who reports that they have been friends for 14 years and last time he spoke to her was 2 days ago he describes that phone call that she was at her baseline except for being depressed but denies any concern about her safety if discharged home he denies her having access to guns and confirms that she lives with roommate in an apartment, he confirms that he helps her with making follow-up appointments.  she will need follow-up appointment with neurosurgery as recommended by neurosurgery consult in the hospital with (to see Dr. Lanis in the office in a few weeks).    Principal Problem: Schizoaffective disorder, bipolar type (HCC) Diagnosis: Principal Problem:   Schizoaffective disorder, bipolar type (HCC) Active Problems:   SAH (subarachnoid hemorrhage) (HCC)   Cocaine use disorder (HCC)   Delta-9-tetrahydrocannabinol (THC) dependence (HCC)   Alcohol use disorder    Past Psychiatric History:  Previous Psych Diagnoses: Schizoaffective disorder, bipolar type.  Polysubstance abuse. Prior inpatient treatment: Denies, but as per chart review, admitted on 10/20/2011 for HI towards her mother in the context of substance abuse. -Patient also has another mental health related hospitalization on 11/22/2010. Current/prior outpatient treatment: Denies Prior rehab hx: None Psychotherapy hx: Denies History of suicide attempts: Denies History of homicide or aggression: Denies any history of aggression, but as per chart review, and  as per collateral information from patient's sister, she tends to get very  aggressive at home to her family members and to strangers as well.   Psychiatric medication history: Denies ever being on psychotropic medications Psychiatric medication compliance history: Chronically noncompliant Neuromodulation history: None Current Psychiatrist: None Current therapist: None  Past Medical History:  Past Medical History:  Diagnosis Date   Addiction, marijuana (HCC)    Alcohol abuse    Chlamydia    Cocaine addiction (HCC)    Depression    History of concussion    Hypertension    does not take any meds    Past Surgical History:  Procedure Laterality Date   IR 3D INDEPENDENT WKST  06/03/2023   IR ANGIO INTRA EXTRACRAN SEL INTERNAL CAROTID BILAT MOD SED  06/03/2023   IR ANGIO VERTEBRAL SEL VERTEBRAL BILAT MOD SED  06/03/2023   IR ANGIOGRAM FOLLOW UP STUDY  06/03/2023   IR ANGIOGRAM FOLLOW UP STUDY  06/03/2023   IR ANGIOGRAM FOLLOW UP STUDY  06/03/2023   IR NEURO EACH ADD'L AFTER BASIC UNI LEFT (MS)  06/03/2023   IR TRANSCATH/EMBOLIZ  06/03/2023   IR US  GUIDE VASC ACCESS RIGHT  06/03/2023   RADIOLOGY WITH ANESTHESIA N/A 06/03/2023   Procedure: IR WITH ANESTHESIA;  Surgeon: de Macedo Rodrigues, Katyucia, MD;  Location: [facility] OR;  Service: Radiology;  Laterality: N/A;   WISDOM TOOTH EXTRACTION  2007   Family History:  Family History  Problem Relation Age of Onset   Hypertension Other    Diabetes Other    Heart failure Mother    Hypertension Mother    Cervical cancer Paternal Aunt    Family Psychiatric  History: Unknown Social History:  Patient reports that she resides with a roommate who is autistic.  Reports that she has 1 child, who is 62 years old, and a granddaughter who is 33-year-old.  Reports that she was born and raised in [place], [place].  Reports highest level of education as being some college.  Reports that her parents are still alive and are supportive of her.    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 Finances: A stressor Legal: States that she has l court date, but refuses to discuss details. Military: Denies   Current Medications: Current Facility-Administered Medications  Medication Dose Route Frequency Provider Last Rate Last Admin   acetaminophen  (TYLENOL ) tablet 650 mg  650 mg Oral Q4H PRN Lenard Calin, MD       acetaminophen  (TYLENOL ) tablet 650 mg  650 mg Oral Q6H PRN Lenard Calin, MD   650 mg at 06/16/23 0709   alum & mag hydroxide-simeth (MAALOX/MYLANTA) 200-200-20 MG/5ML suspension 30 mL  30 mL Oral Q4H PRN Lenard Calin, MD       haloperidol  (HALDOL ) tablet 5 mg  5 mg Oral TID PRN McLauchlin, Angela, NP       And   diphenhydrAMINE  (BENADRYL ) capsule 50 mg  50 mg Oral TID PRN McLauchlin, Angela, NP       diphenhydrAMINE  (BENADRYL ) capsule 50 mg  50 mg Oral Q6H PRN Nkwenti, Doris, NP       Or   diphenhydrAMINE  (BENADRYL ) injection 50 mg  50 mg Intramuscular Q6H PRN Nkwenti, Doris, NP       haloperidol  lactate (HALDOL ) injection 10 mg  10 mg Intramuscular TID PRN McLauchlin, Angela, NP       And  diphenhydrAMINE  (BENADRYL ) injection 50 mg  50 mg Intramuscular TID PRN McLauchlin, Jon, NP       And   LORazepam  (ATIVAN ) injection 2 mg  2 mg Intramuscular TID PRN McLauchlin, Angela, NP       haloperidol  (HALDOL ) tablet 5 mg  5 mg Oral Q8H PRN Fredia Dorothe HERO, MD       Or   haloperidol  lactate (HALDOL ) injection 5 mg  5 mg Intramuscular Q8H PRN Fredia Dorothe HERO, MD       haloperidol  lactate (HALDOL ) injection 5 mg  5 mg Intramuscular TID PRN Lenard Calin, MD       And   LORazepam  (ATIVAN ) injection 2 mg  2 mg Intramuscular TID PRN Lenard Calin, MD       hydrOXYzine  (ATARAX ) tablet 25 mg  25 mg Oral TID PRN McLauchlin, Angela, NP   25 mg at 06/16/23 0038   magnesium  hydroxide (MILK OF MAGNESIA) suspension 30 mL  30 mL Oral Daily PRN Lenard Calin, MD       melatonin tablet 5 mg  5 mg Oral QHS Evelena, Annemarie Sebree, MD   5 mg at 06/15/23 2041   multivitamin with minerals tablet 1 tablet  1 tablet Oral Daily Lenard Calin, MD   1 tablet at 06/15/23 0908   nicotine  (NICODERM CQ  - dosed in mg/24 hr) patch 7 mg  7 mg Transdermal Daily Lenard Calin, MD   7 mg at 06/15/23 9091   nicotine  polacrilex (NICORETTE ) gum 2 mg  2 mg Oral PRN Trudy Carwin, NP       niMODipine  (NIMOTOP ) capsule 60 mg  60 mg Oral QID Lenard Calin, MD   60 mg at 06/15/23 2041   ondansetron  (ZOFRAN ) injection 4 mg  4 mg Intravenous Q6H PRN Lenard Calin, MD       polyethylene glycol (MIRALAX  / GLYCOLAX ) packet 17 g  17 g Per Tube Daily PRN Lenard Calin, MD       risperiDONE  (RISPERDAL ) tablet 2 mg  2 mg Oral QHS Lenard Calin, MD   2 mg at 06/15/23 2041   thiamine  (Vitamin B-1) tablet 100 mg  100 mg Oral Daily Lenard Calin, MD   100 mg at 06/15/23 9090   traMADol  (ULTRAM ) tablet 50 mg  50 mg Oral Q6H PRN Lenard Calin, MD   50 mg at 06/15/23 0350   traZODone  (DESYREL ) tablet 50 mg  50 mg Oral QHS PRN McLauchlin, Jon, NP        Lab Results:  Results for orders placed or performed during the hospital encounter of 06/13/23 (from the past 48 hours)  Lipid panel     Status: None   Collection Time: 06/15/23  6:44 AM  Result Value Ref Range   Cholesterol 188 0 - 200 mg/dL   Triglycerides 872 <849 mg/dL   HDL 72 >59 mg/dL   Total CHOL/HDL Ratio 2.6 RATIO   VLDL 25 0 - 40 mg/dL   LDL Cholesterol 91 0 - 99 mg/dL    Comment:        Total Cholesterol/HDL:CHD Risk Coronary Heart Disease Risk Table                     Men   Women  1/2 Average Risk   3.4   3.3  Average Risk       5.0   4.4  2 X Average Risk   9.6   7.1  3 X Average Risk  23.4   11.0  Use the calculated Patient Ratio above and the CHD Risk Table to determine the patient's CHD Risk.        ATP III CLASSIFICATION (LDL):  <100     mg/dL   Optimal  899-870  mg/dL   Near or  Above                    Optimal  130-159  mg/dL   Borderline  839-810  mg/dL   High  >809     mg/dL   Very High Performed at [facility], [address] [place], [place] [address]   TSH     Status: None   Collection Time: 06/15/23  6:44 AM  Result Value Ref Range   TSH 3.470 0.350 - 4.500 uIU/mL    Comment: Performed by a 3rd Generation assay with a functional sensitivity of <=0.01 uIU/mL. Performed at [facility], [address] [place], [place] [address]   VITAMIN D  25 Hydroxy (Vit-D Deficiency, Fractures)     Status: Abnormal   Collection Time: 06/15/23  6:44 AM  Result Value Ref Range   Vit D, 25-Hydroxy 14.10 (L) 30 - 100 ng/mL    Comment: (NOTE) Vitamin D  deficiency has been defined by the Institute of Medicine  and an Endocrine Society practice guideline as a level of serum 25-OH  vitamin D  less than 20 ng/mL (1,2). The Endocrine Society went on to  further define vitamin D  insufficiency as a level between 21 and 29  ng/mL (2).  1. IOM (Institute of Medicine). 2010. Dietary reference intakes for  calcium and D. [place]: [employer]. 2. Holick MF, Binkley Gardiner, Bischoff-Ferrari HA, et al. Evaluation,  treatment, and prevention of vitamin D  deficiency: an Endocrine  Society clinical practice guideline, JCEM. 2011 Jul; 96(7): 1911-30.  Performed at [facility], [address], [place], [place] [address]     Blood Alcohol level:  Lab Results  Component Value Date   [facility] <10 06/03/2023   ETH <10 04/03/2023    Metabolic Disorder Labs: Lab Results  Component Value Date   HGBA1C 5.6 06/08/2023   MPG 114 06/08/2023   No results found for: PROLACTIN Lab Results  Component Value Date   CHOL 188 06/15/2023   TRIG 127 06/15/2023   HDL 72 06/15/2023   CHOLHDL 2.6 06/15/2023   VLDL 25 06/15/2023   LDLCALC 91 06/15/2023    Physical Findings: AIMS: Facial and Oral Movements Muscles of Facial  Expression: None Lips and Perioral Area: None Jaw: None Tongue: None,Extremity Movements Upper (arms, wrists, hands, fingers): None Lower (legs, knees, ankles, toes): None, Trunk Movements Neck, shoulders, hips: None, Global Judgements Severity of abnormal movements overall : None Incapacitation due to abnormal movements: None Patient's awareness of abnormal movements: No Awareness, Dental Status Current problems with teeth and/or dentures?: No Does patient usually wear dentures?: No Edentia?: No  CIWA:    COWS:     Musculoskeletal: Strength & Muscle Tone: within normal limits Gait & Station: normal Patient leans: N/A  Psychiatric Specialty Exam:  General Appearance: Dressed in hospital gown, fairly groomed  Behavior: Pleasant and cooperative  Psychomotor Activity:No psychomotor agitation or retardation noted   Eye Contact: Improved, fair Speech: Normal amount, normal tone and volume   Mood: Pleasant, euthymic Affect: Congruent, calm, smiling appropriately  Thought Process: Linear yet concrete Descriptions of Associations: Intact Thought Content: Preoccupied with wanting discharge Hallucinations: Denies AH, VH, does not appear responding to  stimuli Delusions: No paranoia  Suicidal Thoughts: Denies passive or active SI, intention, plan  Homicidal Thoughts: Denies HI, intention, plan   Alertness/Orientation: Alert and oriented to person place and time, limited orientation to situation  Insight: Improved yet limited Judgment: Improved, fair  Memory: Limited, questionable baseline  Executive Functions  Concentration: Limited Attention Span: Limited Recall: Limited Fund of Knowledge: Limited   Assets  Assets: Resilience Access to care   Physical Exam: Physical Exam Vitals and nursing note reviewed.    Review of Systems  Neurological:  Positive for headaches.  All other systems reviewed and are negative.  Blood pressure 98/82, pulse 79, temperature  97.7 F (36.5 C), temperature source Oral, resp. rate 18, height 5' 6 (1.676 m), weight 61.7 kg, last menstrual period 05/11/2023, SpO2 100%, unknown if currently breastfeeding. Body mass index is 21.95 kg/m.   Treatment Plan Summary: Daily contact with patient to assess and evaluate symptoms and progress in treatment and Medication management  ASSESSMENT:  Diagnoses / Active Problems: Principal Problem: Schizoaffective disorder, bipolar type (HCC) Diagnosis: Principal Problem:   Schizoaffective disorder, bipolar type (HCC) Active Problems:   SAH (subarachnoid hemorrhage) (HCC)   Cocaine use disorder (HCC)   Delta-9-tetrahydrocannabinol (THC) dependence (HCC)   Alcohol use disorder   PLAN: Safety and Monitoring:  -- Involuntary admission to inpatient psychiatric unit for safety, stabilization and treatment  -- Daily contact with patient to assess and evaluate symptoms and progress in treatment  -- Patient's case to be discussed in multi-disciplinary team meeting  -- Observation Level : q15 minute checks  -- Vital signs:  q12 hours  -- Precautions: suicide, elopement, and assault  2. Medications:   Continue risperidone  2 mg at bedtime for mood and psychosis  Continue melatonin 5 mg at bedtime for sleep  Continue trazodone  as needed for sleep and Vistaril  as needed for anxiety  Continue amlodipine 60 mg 4 times daily until 1/21 as per hospitalist recommendations at time of discharge   --  The risks/benefits/side-effects/alternatives to this medication were discussed in detail with the patient and time was given for questions. The patient consents to medication trial.    -- Metabolic profile and EKG monitoring obtained while on an atypical antipsychotic (BMI: Lipid Panel: HbgA1c: QTc:)      3. Pertinent labs: Reviewed      Lab ordered: None   4. Tobacco Use Disorder  -- Nicotine  patch 21mg /24 hours ordered  -- Smoking cessation encouraged  5. Group and Therapy: --  Encouraged patient to participate in unit milieu and in scheduled group therapies     Patient was counseled regarding need to abstain completely from illicit drug use after discharge, she agrees  -- Short Term Goals: Ability to identify changes in lifestyle to reduce recurrence of condition will improve, Ability to verbalize feelings will improve, Ability to disclose and discuss suicidal ideas, and Ability to demonstrate self-control will improve  -- Long Term Goals: Improvement in symptoms so as ready for discharge  6. Discharge Planning:   -- Social work and case management to assist with discharge planning and identification of hospital follow-up needs prior to discharge  -- Estimated LOS: 5-7 days  -- Discharge Concerns: Need to establish a safety plan; Medication compliance and effectiveness  -- Discharge Goals: Return home with outpatient referrals for mental health follow-up including medication management/psychotherapy      Total Time Spent in Direct Patient Care:  I personally spent 35 minutes on the unit in direct patient care. The direct  patient care time included face-to-face time with the patient, reviewing the patient's chart, communicating with other professionals, and coordinating care. Greater than 50% of this time was spent in counseling or coordinating care with the patient regarding goals of hospitalization, psycho-education, and discharge planning needs.   Jonique Kulig Evelena, MD 06/16/2023, 10:03 AM

## 2023-06-17 ENCOUNTER — Encounter (HOSPITAL_COMMUNITY): Payer: Self-pay | Admitting: Psychiatry

## 2023-06-17 DIAGNOSIS — F250 Schizoaffective disorder, bipolar type: Principal | ICD-10-CM

## 2023-06-17 MED ORDER — MELATONIN 5 MG PO TABS: 5.0000 mg | ORAL_TABLET | Freq: Every day | ORAL | 0 refills | Status: DC

## 2023-06-17 MED ORDER — RISPERIDONE 2 MG PO TABS: 2.0000 mg | ORAL_TABLET | Freq: Every day | ORAL | 0 refills | Status: DC

## 2023-06-17 MED ORDER — NIMODIPINE 30 MG PO CAPS: 60.0000 mg | ORAL_CAPSULE | Freq: Four times a day (QID) | ORAL | 0 refills | Status: DC

## 2023-06-17 MED ORDER — VITAMIN D (ERGOCALCIFEROL) 1.25 MG (50000 UNIT) PO CAPS: 50000.0000 [IU] | ORAL_CAPSULE | ORAL | 0 refills | Status: DC

## 2023-06-17 NOTE — Plan of Care (Signed)
 Alert and oriented, compliant with medication regimen and care, complaints of headaches, and meds administered for headache. 15 safety checks performed, denies SI/HI/ hallucinations. Care, comfort and safety maintained/ongoing.   Problem: Education: Goal: Knowledge of [facility] Education information/materials will improve Outcome: Progressing Goal: Emotional status will improve Outcome: Progressing Goal: Mental status will improve Outcome: Progressing Goal: Verbalization of understanding the information provided will improve Outcome: Progressing   Problem: Activity: Goal: Interest or engagement in activities will improve Outcome: Progressing Goal: Sleeping patterns will improve Outcome: Progressing   Problem: Coping: Goal: Ability to verbalize frustrations and anger appropriately will improve Outcome: Progressing Goal: Ability to demonstrate self-control will improve Outcome: Progressing   Problem: Health Behavior/Discharge Planning: Goal: Identification of resources available to assist in meeting health care needs will improve Outcome: Progressing Goal: Compliance with treatment plan for underlying cause of condition will improve Outcome: Progressing   Problem: Physical Regulation: Goal: Ability to maintain clinical measurements within normal limits will improve Outcome: Progressing   Problem: Safety: Goal: Periods of time without injury will increase Outcome: Progressing   Problem: Activity: Goal: Will verbalize the importance of balancing activity with adequate rest periods Outcome: Progressing   Problem: Education: Goal: Will be free of psychotic symptoms Outcome: Progressing Goal: Knowledge of the prescribed therapeutic regimen will improve Outcome: Progressing   Problem: Coping: Goal: Coping ability will improve Outcome: Progressing Goal: Will verbalize feelings Outcome: Progressing   Problem: Health Behavior/Discharge Planning: Goal: Compliance  with prescribed medication regimen will improve Outcome: Progressing   Problem: Nutritional: Goal: Ability to achieve adequate nutritional intake will improve Outcome: Progressing   Problem: Role Relationship: Goal: Ability to communicate needs accurately will improve Outcome: Progressing Goal: Ability to interact with others will improve Outcome: Progressing   Problem: Safety: Goal: Ability to redirect hostility and anger into socially appropriate behaviors will improve Outcome: Progressing Goal: Ability to remain free from injury will improve Outcome: Progressing   Problem: Self-Care: Goal: Ability to participate in self-care as condition permits will improve Outcome: Progressing   Problem: Self-Concept: Goal: Will verbalize positive feelings about self Outcome: Progressing   Problem: Education: Goal: Ability to state activities that reduce stress will improve Outcome: Progressing   Problem: Coping: Goal: Ability to identify and develop effective coping behavior will improve Outcome: Progressing   Problem: Self-Concept: Goal: Ability to identify factors that promote anxiety will improve Outcome: Progressing Goal: Level of anxiety will decrease Outcome: Progressing Goal: Ability to modify response to factors that promote anxiety will improve Outcome: Progressing

## 2023-06-17 NOTE — [facility] Suicide Risk Assessment (Signed)
 Suicide Risk Assessment  Discharge Assessment    [employer] [facility] Discharge Suicide Risk Assessment   Principal Problem: Schizoaffective disorder, bipolar type [facility]) Discharge Diagnoses: Principal Problem:   Schizoaffective disorder, bipolar type (HCC) Active Problems:   SAH (subarachnoid hemorrhage) (HCC)   Cocaine use disorder (HCC)   Delta-9-tetrahydrocannabinol (THC) dependence (HCC)   Alcohol use disorder  HPI: Patient is a 38 year old African-American female with prior mental health diagnoses of schizophrenia, polysubstance abuse, including EtOH, THC & cocaine who initially presented to the [facility] ED on 12/30 with complaints of a headache, & was diagnosed with a subarachnoid hemorrhage. She was medically treated and cleared prior to being transferred and admitted under IVC status to this [facility] on 1/09 for stabilization of her mental status.    During the patient's hospitalization, patient had extensive initial psychiatric evaluation, and follow-up psychiatric evaluations every day.  Psychiatric diagnoses provided upon initial assessment: As listed above.  Patient's psychiatric medications were adjusted on admission: As follows: -Continue Risperdal  2 mg nightly for psychosis and mood stabilization -Continue nicotine  patch daily, increased to 21 mg for nicotine  dependence -Continue hydroxyzine  25 mg twice daily as needed for anxiety -Continue melatonin 3 mg nightly for sleep -Continue Nimotop  60 mg 4 times daily until 06/25/2023 as per the medicine team's recommendations for [facility].  -No trazodone  as per the medicine team, it was discontinued due to its tendency to worsen patient's headaches   During the hospitalization, other adjustments were made to the patient's psychiatric medication regimen: Medications at discharge are as follows:  -Continue Risperdal  2 mg nightly for psychosis and mood stabilization -Continue melatonin 3 mg nightly for sleep -Continue Nimotop  60 mg 4 times daily until  06/25/2023 as per the medicine team's recommendations. Referral created in the system for f/u with: [facility] [address], [place], [place] [address] P: [phone]  F: [phone]  Writer attempted to call and get an appointement prior to discharge, but message on the neurosurgery line stated  We do not actively answer calls, all calls are directed to voice mail. Writer called pt's friend Tatiana who she indicates is her support person and notified him that he should call Neurosujrgery at the phone number above to f/u regarding making an appointment for pt in the next 24 hrs. Friend verbalized understanding and stated that he would call.  -Pt refused offer of medication for smoking cessation, and has been persistent in refusing this since hospitalization.  Patient's care was discussed during the interdisciplinary team meeting every day during the hospitalization. The patient denies having side effects to prescribed psychiatric medication. Gradually, patient started adjusting to milieu. The patient was evaluated each day by a clinical provider to ascertain response to treatment. Improvement was noted by the patient's report of decreasing symptoms, improved sleep and appetite, affect, medication tolerance, behavior, and participation in unit programming.  Patient was asked each day to complete a self inventory noting mood, mental status, pain, new symptoms, anxiety and concerns.    Symptoms were reported as significantly decreased or resolved completely by discharge.   On day of discharge, the patient reports that their mood is stable. The patient denied having suicidal thoughts for more than 48 hours prior to discharge.  Patient denies having homicidal thoughts.  Patient denies having auditory hallucinations.  Patient denies any visual hallucinations or other symptoms of psychosis. The patient was motivated to continue taking medication with a goal of  continued improvement in mental health.   The patient reports  their target psychiatric symptoms of psychosis, anxiety, insomnia & depression responded well to the psychiatric medications, and the patient reports overall benefit from this psychiatric hospitalization. Supportive psychotherapy was provided to the patient. The patient also participated in regular group therapy while hospitalized. Coping skills, problem solving as well as relaxation therapies were also part of the unit programming.  Labs were reviewed with the patient, and abnormal results were discussed with the patient. Vitamin D  low at 14.10, educated on the need to start taking Vitamin D  50.000 units weekly for supplementation. Educated to f/u all other medical problems with her PCP.  The patient is able to verbalize their individual safety plan to this provider.  # It is recommended to the patient to continue psychiatric medications as prescribed, after discharge from the hospital.    # It is recommended to the patient to follow up with your outpatient psychiatric provider and PCP.  # It was discussed with the patient, the impact of alcohol, drugs, tobacco have been there overall psychiatric and medical wellbeing, and total abstinence from substance use was recommended the patient.ed.  # Prescriptions provided or sent directly to preferred pharmacy at discharge. Patient agreeable to plan. Given opportunity to ask questions. Appears to feel comfortable with discharge.    # In the event of worsening symptoms, the patient is instructed to call the crisis hotline (988), 911 and or go to the nearest ED for appropriate evaluation and treatment of symptoms. To follow-up with primary care provider for other medical issues, concerns and or health care needs  # Patient was discharged home with a plan to follow up as noted below.   Total Time spent with patient: 45 minutes  Musculoskeletal: Strength & Muscle Tone: within normal  limits Gait & Station: normal Patient leans: N/A  Psychiatric Specialty Exam  Presentation  General Appearance:  Appropriate for Environment; Fairly Groomed  Eye Contact: Fair  Speech: Clear and Coherent  Speech Volume: Normal  Handedness: Right   Mood and Affect  Mood: Euthymic  Duration of Depression Symptoms: No data recorded Affect: Appropriate; Congruent   Thought Process  Thought Processes: Coherent  Descriptions of Associations:Intact  Orientation:Full (Time, Place and Person)  Thought Content:Logical  History of Schizophrenia/Schizoaffective disorder:No data recorded Duration of Psychotic Symptoms:No data recorded Hallucinations:Hallucinations: None  Ideas of Reference:None  Suicidal Thoughts:Suicidal Thoughts: No  Homicidal Thoughts:Homicidal Thoughts: No   Sensorium  Memory: Immediate Good  Judgment: Good  Insight: Good  Executive Functions  Concentration: Good  Attention Span: Good  Recall: Fair  Fund of Knowledge: Fair  Language: Good   Psychomotor Activity  Psychomotor Activity:Psychomotor Activity: Normal   Assets  Assets: Resilience; Social Support  Sleep  Sleep:Sleep: Good   Physical Exam: Physical Exam Constitutional:      Appearance: Normal appearance.  Musculoskeletal:        General: Normal range of motion.     Cervical back: Normal range of motion.  Neurological:     Mental Status: She is alert and oriented to person, place, and time.  Psychiatric:        Mood and Affect: Mood normal.        Behavior: Behavior normal.        Thought Content: Thought content normal.        Judgment: Judgment normal.    Review of Systems  Psychiatric/Behavioral:  Positive for depression (Stable for outpatient management) and substance abuse (Educated on cessation, not interested in rehab). Negative for hallucinations, memory loss and suicidal ideas.  The patient is nervous/anxious (Stable for outpatient  management) and has insomnia (Stable for outpatient management).   All other systems reviewed and are negative.  Blood pressure 97/63, pulse 80, temperature 98.2 F (36.8 C), temperature source Oral, resp. rate 18, height 5' 6 (1.676 m), weight 61.7 kg, last menstrual period 05/11/2023, SpO2 100%, unknown if currently breastfeeding. Body mass index is 21.95 kg/m.  Mental Status Per Nursing Assessment::   On Admission:  NA  Demographic Factors:  Low socioeconomic status  Loss Factors: Financial problems/change in socioeconomic status  Historical Factors: Family history of mental illness or substance abuse  Risk Reduction Factors:   Positive social support  Continued Clinical Symptoms:  Schizophrenia:   Depressive state-Symptoms have resolved and pt is stable for outpatient management.  Cognitive Features That Contribute To Risk:  None    Suicide Risk:  Mild:  There are no identifiable suicide plans, no associated intent, mild dysphoria and related symptoms, good self-control (both objective and subjective assessment), few other risk factors, and identifiable protective factors, including available and accessible social support.    Follow-up Information     [facility] Follow up.   Contact information: [address] [place] [address] [phone]         [facility] Follow up.   Contact information: [address] [place], [place] [address] [phone]         [name], MD. Call in 1 day(s).   Specialty: Neurosurgery Why: A referral has been sent to this Neurosurgeon who saw you at the hospital and treated you for your [facility] & Aneurysm. Please call them within 24 hrs of discharge to follow up on getting the appointment. Contact information: [address] [place] [address] [phone]                Donia Snell, NP 06/17/2023, 10:37 AM

## 2023-06-17 NOTE — Group Note (Signed)
 Recreation Therapy Group Note   Group Topic:General Recreation  Group Date: 06/17/2023 Start Time: 1005 End Time: 1030 Facilitators: Baillie Mohammad-McCall, LRT, CTRS Location: [address] Day[address]   Group Topic/Focus: General Recreation   Goal Area(s) Addresses:  Patient will discuss the importance of music in daily life. Patient will appropriately engage in group session.  Behavioral Response: Appropriate   Intervention: Music  Activity:  Music Therapy. LRT and patients discussed how music can affect a person's mood and emotions. Patients were then allowed to pick songs they wanted to play for the group. Patients would also share the importance of the songs they chose. Patient songs had to be clean and appropriate for group session.     Affect/Mood: N/A   Participation Level: Did not attend    Clinical Observations/Individualized Feedback:     Plan: Continue to engage patient in RT group sessions 2-3x/week.   Audia Amick-McCall, LRT,CTRS 06/17/2023 1:03 PM

## 2023-06-17 NOTE — Progress Notes (Signed)
 Patient verbalizes readiness for discharge. All patient belongings returned to patient. Discharge instructions read and discussed with patient (appointments, medications, resources). Patient expressed gratitude for care provided. Patient discharged to lobby at 1345 where her aunt was waiting.

## 2023-06-17 NOTE — Discharge Summary (Signed)
 Physician Discharge Summary Note  Patient:  Tiffany [name] is an 38 y.o., female MRN:  [id number] DOB:  July 02, 1985 Patient phone:  [phone] (home)  Patient address:   [address] [place] [facility] [address],  Total Time spent with patient: 45 minutes  Date of Admission:  06/13/2023 Date of Discharge: 06/17/2023  Reason for Admission:  : Patient is a 38 year old African-American female with prior mental health diagnoses of schizophrenia, polysubstance abuse, including EtOH, THC & cocaine who initially presented to the [facility] ED on 12/30 with complaints of a headache, & was diagnosed with a subarachnoid hemorrhage. She was medically treated and cleared prior to being transferred and admitted under IVC status to this [facility] on 1/09 for stabilization of her mental status.    Principal Problem: Schizoaffective disorder, bipolar type [facility]) Discharge Diagnoses: Principal Problem:   Schizoaffective disorder, bipolar type (HCC) Active Problems:   SAH (subarachnoid hemorrhage) (HCC)   Cocaine use disorder (HCC)   Delta-9-tetrahydrocannabinol (THC) dependence (HCC)   Alcohol use disorder  Past Psychiatric History: See below   Past Medical History:  Past Medical History:  Diagnosis Date   Addiction, marijuana (HCC)    Alcohol abuse    Chlamydia    Cocaine addiction (HCC)    Depression    History of concussion    Hypertension    does not take any meds    Past Surgical History:  Procedure Laterality Date   IR 3D INDEPENDENT WKST  06/03/2023   IR ANGIO INTRA EXTRACRAN SEL INTERNAL CAROTID BILAT MOD SED  06/03/2023   IR ANGIO VERTEBRAL SEL VERTEBRAL BILAT MOD SED  06/03/2023   IR ANGIOGRAM FOLLOW UP STUDY  06/03/2023   IR ANGIOGRAM FOLLOW UP STUDY  06/03/2023   IR ANGIOGRAM FOLLOW UP STUDY  06/03/2023   IR NEURO EACH ADD'L AFTER BASIC UNI LEFT (MS)  06/03/2023   IR TRANSCATH/EMBOLIZ  06/03/2023   IR US  GUIDE VASC ACCESS RIGHT  06/03/2023   RADIOLOGY WITH ANESTHESIA N/A  06/03/2023   Procedure: IR WITH ANESTHESIA;  Surgeon: de Macedo Rodrigues, Katyucia, MD;  Location: [facility] OR;  Service: Radiology;  Laterality: N/A;   WISDOM TOOTH EXTRACTION  2007   Family History:  Family History  Problem Relation Age of Onset   Hypertension Other    Diabetes Other    Heart failure Mother    Hypertension Mother    Cervical cancer Paternal Aunt    Family Psychiatric  History: Denies  Social History:  Social History   Substance and Sexual Activity  Alcohol Use Yes   Alcohol/week: 13.0 standard drinks of alcohol   Types: 12 Cans of beer, 1 Standard drinks or equivalent per week   Comment: daily     Social History   Substance and Sexual Activity  Drug Use Yes   Frequency: 1.0 times per week   Types: Marijuana, Cocaine   Comment: cocaine use currently    Social History   Socioeconomic History   Marital status: Single    Spouse name: Not on file   Number of children: Not on file   Years of education: Not on file   Highest education level: Not on file  Occupational History   Not on file  Tobacco Use   Smoking status: Every Day    Current packs/day: 1.00    Average packs/day: 1 pack/day for 9.0 years (9.0 ttl pk-yrs)    Types: Cigarettes   Smokeless tobacco: Never  Substance and Sexual Activity   Alcohol use:  Yes    Alcohol/week: 13.0 standard drinks of alcohol    Types: 12 Cans of beer, 1 Standard drinks or equivalent per week    Comment: daily   Drug use: Yes    Frequency: 1.0 times per week    Types: Marijuana, Cocaine    Comment: cocaine use currently   Sexual activity: Yes    Birth control/protection: Condom  Other Topics Concern   Not on file  Social History Narrative   Not on file   Social Drivers of Health   Financial Resource Strain: Not on file  Food Insecurity: Food Insecurity Present (06/05/2023)   Hunger Vital Sign    Worried About Programme Researcher, Broadcasting/film/video in the Last Year: Often true    Ran Out of Food in the Last Year: Often true   Transportation Needs: Unmet Transportation Needs (06/05/2023)   PRAPARE - Administrator, Civil Service (Medical): Yes    Lack of Transportation (Non-Medical): Yes  Physical Activity: Not on file  Stress: Not on file  Social Connections: Unknown (06/13/2023)   Social Connection and Isolation Panel [NHANES]    Frequency of Communication with Friends and Family: Patient unable to answer    Frequency of Social Gatherings with Friends and Family: Patient unable to answer    Attends Religious Services: Patient unable to answer    Active Member of Clubs or Organizations: Not on file    Attends Banker Meetings: Patient unable to answer    Marital Status: Patient unable to answer   Hospital Course:   During the patient's hospitalization, patient had extensive initial psychiatric evaluation, and follow-up psychiatric evaluations every day.   Psychiatric diagnoses provided upon initial assessment: As listed above.   Patient's psychiatric medications were adjusted on admission: As follows: -Continue Risperdal  2 mg nightly for psychosis and mood stabilization -Continue nicotine  patch daily, increased to 21 mg for nicotine  dependence -Continue hydroxyzine  25 mg twice daily as needed for anxiety -Continue melatonin 3 mg nightly for sleep -Continue Nimotop  60 mg 4 times daily until 06/25/2023 as per the medicine team's recommendations for [facility].  -No trazodone  as per the medicine team, it was discontinued due to its tendency to worsen patient's headaches   During the hospitalization, other adjustments were made to the patient's psychiatric medication regimen: Medications at discharge are as follows:   -Continue Risperdal  2 mg nightly for psychosis and mood stabilization -Continue melatonin 3 mg nightly for sleep -Continue Nimotop  60 mg 4 times daily until 06/25/2023 as per the medicine team's recommendations. Referral created in the system for f/u with: [facility] [address], [place], [place] [address] P: [phone]  F: [phone]  Writer attempted to call and get an appointement prior to discharge, but message on the neurosurgery line stated  We do not actively answer calls, all calls are directed to voice mail. Writer called pt's friend Tatiana who she indicates is her support person and notified him that he should call Neurosujrgery at the phone number above to f/u regarding making an appointment for pt in the next 24 hrs. Friend verbalized understanding and stated that he would call.   -Pt refused offer of medication for smoking cessation, and has been persistent in refusing this since hospitalization.   Patient's care was discussed during the interdisciplinary team meeting every day during the hospitalization. The patient denies having side effects to prescribed psychiatric medication. Gradually, patient started adjusting to milieu. The patient was evaluated  each day by a clinical provider to ascertain response to treatment. Improvement was noted by the patient's report of decreasing symptoms, improved sleep and appetite, affect, medication tolerance, behavior, and participation in unit programming.  Patient was asked each day to complete a self inventory noting mood, mental status, pain, new symptoms, anxiety and concerns.     Symptoms were reported as significantly decreased or resolved completely by discharge.    On day of discharge, the patient reports that their mood is stable. The patient denied having suicidal thoughts for more than 48 hours prior to discharge.  Patient denies having homicidal thoughts.  Patient denies having auditory hallucinations.  Patient denies any visual hallucinations or other symptoms of psychosis. The patient was motivated to continue taking medication with a goal of continued improvement in mental health.    The patient reports their target psychiatric symptoms of psychosis, anxiety,  insomnia & depression responded well to the psychiatric medications, and the patient reports overall benefit from this psychiatric hospitalization. Supportive psychotherapy was provided to the patient. The patient also participated in regular group therapy while hospitalized. Coping skills, problem solving as well as relaxation therapies were also part of the unit programming.   Labs were reviewed with the patient, and abnormal results were discussed with the patient. Vitamin D  low at 14.10, educated on the need to start taking Vitamin D  50.000 units weekly for supplementation. Educated to f/u all other medical problems with her PCP.   The patient is able to verbalize their individual safety plan to this provider.   # It is recommended to the patient to continue psychiatric medications as prescribed, after discharge from the hospital.     # It is recommended to the patient to follow up with your outpatient psychiatric provider and PCP.   # It was discussed with the patient, the impact of alcohol, drugs, tobacco have been there overall psychiatric and medical wellbeing, and total abstinence from substance use was recommended the patient.ed.   # Prescriptions provided or sent directly to preferred pharmacy at discharge. Patient agreeable to plan. Given opportunity to ask questions. Appears to feel comfortable with discharge.    # In the event of worsening symptoms, the patient is instructed to call the crisis hotline (988), 911 and or go to the nearest ED for appropriate evaluation and treatment of symptoms. To follow-up with primary care provider for other medical issues, concerns and or health care needs   # Patient was discharged home with a plan to follow up as noted below.    Total Time spent with patient: 45 minutes Physical Findings: AIMS: Facial and Oral Movements Muscles of Facial Expression: None Lips and Perioral Area: None Jaw: None Tongue: None,Extremity Movements Upper (arms,  wrists, hands, fingers): None Lower (legs, knees, ankles, toes): None, Trunk Movements Neck, shoulders, hips: None, Global Judgements Severity of abnormal movements overall : None Incapacitation due to abnormal movements: None Patient's awareness of abnormal movements: No Awareness, Dental Status Current problems with teeth and/or dentures?: No Does patient usually wear dentures?: No Edentia?: No  CIWA: 0  COWS:  0 AIMS: 0 Musculoskeletal: Strength & Muscle Tone: within normal limits Gait & Station: normal Patient leans: N/A  Psychiatric Specialty Exam:  Presentation  General Appearance:  Appropriate for Environment; Fairly Groomed  Eye Contact: Fair  Speech: Clear and Coherent  Speech Volume: Normal  Handedness: Right   Mood and Affect  Mood: Euthymic  Affect: Appropriate; Congruent   Thought Process  Thought Processes: Coherent  Descriptions of Associations:Intact  Orientation:Full (Time, Place and Person)  Thought Content:Logical  History of Schizophrenia/Schizoaffective disorder:No data recorded Duration of Psychotic Symptoms:No data recorded Hallucinations:Hallucinations: None  Ideas of Reference:None  Suicidal Thoughts:Suicidal Thoughts: No  Homicidal Thoughts:Homicidal Thoughts: No   Sensorium  Memory: Immediate Good  Judgment: Good  Insight: Good   Executive Functions  Concentration: Good  Attention Span: Good  Recall: Fair  Fund of Knowledge: Fair  Language: Good   Psychomotor Activity  Psychomotor Activity: Psychomotor Activity: Normal   Assets  Assets: Resilience; Social Support   Sleep  Sleep: Sleep: Good    Physical Exam: Physical Exam Constitutional:      Appearance: Normal appearance.  Musculoskeletal:        General: Normal range of motion.     Cervical back: Normal range of motion.  Neurological:     Mental Status: She is alert.  Psychiatric:        Mood and Affect: Mood normal.         Behavior: Behavior normal.        Thought Content: Thought content normal.        Judgment: Judgment normal.    Review of Systems  Psychiatric/Behavioral:  Positive for depression (Denies SI/HI, denies plan or intent to harm self or any one else) and substance abuse (Educated on cessation). Negative for hallucinations, memory loss and suicidal ideas. The patient is nervous/anxious (Stable for outpatient management) and has insomnia (Sleeping through the night on current medications).   All other systems reviewed and are negative.  Blood pressure 97/63, pulse 80, temperature 98.2 F (36.8 C), temperature source Oral, resp. rate 18, height 5' 6 (1.676 m), weight 61.7 kg, last menstrual period 05/11/2023, SpO2 100%, unknown if currently breastfeeding. Body mass index is 21.95 kg/m.   Social History   Tobacco Use  Smoking Status Every Day   Current packs/day: 1.00   Average packs/day: 1 pack/day for 9.0 years (9.0 ttl pk-yrs)   Types: Cigarettes  Smokeless Tobacco Never   Tobacco Cessation:  A prescription for an FDA-approved tobacco cessation medication was offered at discharge and the patient refused   Blood Alcohol level:  Lab Results  Component Value Date   [facility] <10 06/03/2023   ETH <10 04/03/2023    Metabolic Disorder Labs:  Lab Results  Component Value Date   HGBA1C 5.6 06/08/2023   MPG 114 06/08/2023   No results found for: PROLACTIN Lab Results  Component Value Date   CHOL 188 06/15/2023   TRIG 127 06/15/2023   HDL 72 06/15/2023   CHOLHDL 2.6 06/15/2023   VLDL 25 06/15/2023   LDLCALC 91 06/15/2023    See Psychiatric Specialty Exam and Suicide Risk Assessment completed by Attending Physician prior to discharge.  Discharge destination:  Home  Is patient on multiple antipsychotic therapies at discharge:  No   Has Patient had three or more failed trials of antipsychotic monotherapy by history:  No  Recommended Plan for Multiple Antipsychotic  Therapies: NA  Discharge Instructions     Ambulatory referral to Neurosurgery   Complete by: As directed    Patient seen while inpatient by Dr. Lanis for a [facility], and follow up was reccommended after discharge from Psychiatry. Please call pt for an appointment at the contact numbers on her chart. Thank you.      Allergies as of 06/17/2023       Reactions   Shellfish Allergy Anaphylaxis   Pollen Extract    Aspirin Palpitations  Medication List     STOP taking these medications    acetaminophen  325 MG tablet Commonly known as: TYLENOL    haloperidol  5 MG tablet Commonly known as: HALDOL    haloperidol  lactate 5 MG/ML injection Commonly known as: HALDOL    nicotine  7 mg/24hr patch Commonly known as: NICODERM CQ  - dosed in mg/24 hr   polyethylene glycol 17 g packet Commonly known as: MIRALAX  / GLYCOLAX    thiamine  100 MG tablet Commonly known as: Vitamin B-1   traMADol  50 MG tablet Commonly known as: ULTRAM        TAKE these medications      Indication  melatonin 5 MG Tabs Take 1 tablet (5 mg total) by mouth at bedtime. What changed:  medication strength how much to take  Indication: Trouble Sleeping   multivitamin with minerals Tabs tablet Take 1 tablet by mouth daily.  Indication: Nutritional Support   niMODipine  30 MG capsule Commonly known as: NIMOTOP  Take 2 capsules (60 mg total) by mouth 4 (four) times daily for 8 days.  Indication: Hemorrhage in the Subarachnoid Space of the Brain   risperiDONE  2 MG tablet Commonly known as: RISPERDAL  Take 1 tablet (2 mg total) by mouth at bedtime.  Indication: psychosis/mood stabilization   senna-docusate 8.6-50 MG tablet Commonly known as: Senokot-S Take 1 tablet by mouth 2 (two) times daily.  Indication: Constipation   Vitamin D  (Ergocalciferol ) 1.25 MG (50000 UNIT) Caps capsule Commonly known as: DRISDOL  Take 1 capsule (50,000 Units total) by mouth every 7 (seven) days.  Indication: Vitamin D   Deficiency        Follow-up Information     [facility] Follow up.   Why: Your appointment is scheduled for Wednesday, February 5 at 11:20 AM.  The appointment will be conducted via Telehealth; however, if you prefer, the doctor can see you in person.  Please call the doctor's office for more information about the appointment. Contact information: [address] [place] [address] [phone]         [facility] Follow up.   Contact information: [address] [place], [place] [address] [phone]         [name], MD. Call in 1 day(s).   Specialty: Neurosurgery Why: A referral has been sent to this Neurosurgeon who saw you at the hospital and treated you for your [facility] & Aneurysm. Please call them within 24 hrs of discharge to follow up on getting the appointment. Contact information: [address] [place] [address] [phone]                Signed: Donia Snell, NP 06/17/2023, 11:59 AM

## 2023-06-17 NOTE — Progress Notes (Signed)
  [facility] Adult Case Management Discharge Plan :  Will you be returning to the same living situation after discharge:  No, patient will go to her aunt's home, [place] [phone]  At discharge, do you have transportation home?: Yes,  Patient's aunt, Apolinar Llano, will pick her up at 1 PM. Do you have the ability to pay for your medications: Yes,  patient has Medicaid.  Release of information consent forms completed and in the chart;  Patient's signature needed at discharge.  Patient to Follow up at:  Follow-up Information     [facility] Follow up.   Why: Your appointment is scheduled for Wednesday, February 5 at 11:20 AM.  The appointment will be conducted via Telehealth; however, if you prefer, the doctor can see you in person.  Please call the doctor's office for more information about the appointment. Contact information: [address] [place] [address] [phone]         [facility] Follow up.   Contact information: [address] [place], [place] [address] [phone]         [name], MD. Call in 1 day(s).   Specialty: Neurosurgery Why: A referral has been sent to this Neurosurgeon who saw you at the hospital and treated you for your [facility] & Aneurysm. Please call them within 24 hrs of discharge to follow up on getting the appointment. Contact information: [address] [place] [address] [phone]                 Next level of care provider has access to [facility] Link:no  Safety Planning and Suicide Prevention discussed: Yes,  aunt, Apolinar Llano, [phone]  Has patient been referred to the Quitline?: Patient does not use tobacco/nicotine  products  Patient has been referred for addiction treatment:  No substance use disorder.   Izzak Fries O Rasheema Truluck, LCSWA 06/17/2023, 12:04 PM

## 2023-06-17 NOTE — [facility] Suicide Risk Assessment (Signed)
 [facility] INPATIENT:  Family/Significant Other Suicide Prevention Education  Suicide Prevention Education:  Education Completed; Apolinar Llano [phone] (aunt)  (name of family member/significant other) has been identified by the patient as the family member/significant other with whom the patient will be residing, and identified as the person(s) who will aid the patient in the event of a mental health crisis (suicidal ideations/suicide attempt).  With written consent from the patient, the family member/significant other has been provided the following suicide prevention education, prior to the and/or following the discharge of the patient.  Patient's aunt said that she doesn't have any concerns with patient coming home at this time.  There are no weapons in her home, and she is not aware of any weapons that [place] could have.  Aunt said that medications are secured in her home.  She said that she will pick up patient at 1 PM today.  The suicide prevention education provided includes the following: Suicide risk factors Suicide prevention and interventions National Suicide Hotline telephone number [facility] assessment telephone number [facility] Emergency Assistance 911 [facility] and/or Residential Mobile Crisis Unit telephone number  Request made of family/significant other to: Remove weapons (e.g., guns, rifles, knives), all items previously/currently identified as safety concern.   Remove drugs/medications (over-the-counter, prescriptions, illicit drugs), all items previously/currently identified as a safety concern.  The family member/significant other verbalizes understanding of the suicide prevention education information provided.  The family member/significant other agrees to remove the items of safety concern listed above.  Ashara Lounsbury O Zoriyah Scheidegger 06/17/2023, 11:08 AM

## 2023-06-18 ENCOUNTER — Telehealth (HOSPITAL_COMMUNITY): Payer: Self-pay | Admitting: Pharmacy Technician

## 2023-06-18 ENCOUNTER — Other Ambulatory Visit (HOSPITAL_COMMUNITY): Payer: Self-pay

## 2023-06-18 NOTE — Telephone Encounter (Signed)
 Patient Product/process Development Scientist completed.    The patient is insured through [employer] and [employer].     Ran test claim for nimodipine  30 mg and the current 30 day co-pay is $1.28.   This test claim was processed through [facility]- copay amounts may vary at other pharmacies due to pharmacy/plan contracts, or as the patient moves through the different stages of their insurance plan.     Reyes Sharps, CPHT Pharmacy Technician III Certified Patient Advocate [facility] Patient Advocate Team Direct Number: [phone]  Fax: [phone]

## 2023-06-18 NOTE — Progress Notes (Signed)
 Patient ID: Tiffany [name], female   DOB: 1986/02/23, 38 y.o.   MRN: [id number] Patient returns to [facility] today, 1/14 states that she is unable to find Nimodipine  30mg  which she is supposed to take x 7 more days as per education provided to her at time of discharge, and as per Neurosurgeon's recommendations. [facility] pharmacist has provided pt with 4 days supply of this medication free of charge, and we have again, reiterated for her to call and f/u with Neurosurgery as soon as possible regarding an appointment, for f/u for her [facility] & post aneurysm repair. A referral was created yesterday for pt since there was no way for writer to make an appt for her since all new patient appointments were being sent to [facility] for a call back. Phone number for Neurosurgeon again provided to pt, and pt educated on the need to go back to the ER should she have a medical emergency requiring immediate care.

## 2023-11-01 ENCOUNTER — Other Ambulatory Visit: Payer: Self-pay | Admitting: Neurosurgery

## 2023-11-01 DIAGNOSIS — I609 Nontraumatic subarachnoid hemorrhage, unspecified: Secondary | ICD-10-CM

## 2023-11-12 ENCOUNTER — Other Ambulatory Visit (HOSPITAL_COMMUNITY)

## 2024-01-01 ENCOUNTER — Other Ambulatory Visit: Payer: Self-pay | Admitting: Neurosurgery

## 2024-01-01 DIAGNOSIS — I609 Nontraumatic subarachnoid hemorrhage, unspecified: Secondary | ICD-10-CM

## 2024-01-14 ENCOUNTER — Other Ambulatory Visit: Payer: Self-pay

## 2024-01-14 ENCOUNTER — Other Ambulatory Visit: Payer: Self-pay | Admitting: Neurosurgery

## 2024-01-14 ENCOUNTER — Ambulatory Visit (HOSPITAL_COMMUNITY)
Admission: RE | Admit: 2024-01-14 | Discharge: 2024-01-14 | Disposition: A | Discharge status: Home / Self Care | Payer: MEDICAID | Source: Ambulatory Visit | Attending: Neurosurgery | Admitting: Neurosurgery

## 2024-01-14 VITALS — BP 101/61 | HR 70 | Temp 97.5°F | Resp 13 | Ht 66.0 in | Wt 130.0 lb

## 2024-01-14 DIAGNOSIS — I609 Nontraumatic subarachnoid hemorrhage, unspecified: Secondary | ICD-10-CM

## 2024-01-14 DIAGNOSIS — I671 Cerebral aneurysm, nonruptured: Secondary | ICD-10-CM | POA: Insufficient documentation

## 2024-01-14 DIAGNOSIS — F17210 Nicotine dependence, cigarettes, uncomplicated: Secondary | ICD-10-CM | POA: Insufficient documentation

## 2024-01-14 HISTORY — PX: IR ANGIO INTRA EXTRACRAN SEL INTERNAL CAROTID UNI L MOD SED: IMG5361

## 2024-01-14 HISTORY — PX: IR US GUIDE VASC ACCESS RIGHT: IMG2390

## 2024-01-14 LAB — CBC WITH DIFFERENTIAL/PLATELET
Abs Immature Granulocytes: 0.01 K/uL (ref 0.00–0.07)
Basophils Absolute: 0.1 K/uL (ref 0.0–0.1)
Basophils Relative: 1 %
Eosinophils Absolute: 0.2 K/uL (ref 0.0–0.5)
Eosinophils Relative: 3 %
HCT: 39.7 % (ref 36.0–46.0)
Hemoglobin: 12.7 g/dL (ref 12.0–15.0)
Immature Granulocytes: 0 %
Lymphocytes Relative: 37 %
Lymphs Abs: 2.4 K/uL (ref 0.7–4.0)
MCH: 28.5 pg (ref 26.0–34.0)
MCHC: 32 g/dL (ref 30.0–36.0)
MCV: 89.2 fL (ref 80.0–100.0)
Monocytes Absolute: 0.6 K/uL (ref 0.1–1.0)
Monocytes Relative: 10 %
Neutro Abs: 3.1 K/uL (ref 1.7–7.7)
Neutrophils Relative %: 49 %
Platelets: 444 K/uL — ABNORMAL HIGH (ref 150–400)
RBC: 4.45 MIL/uL (ref 3.87–5.11)
RDW: 18 % — ABNORMAL HIGH (ref 11.5–15.5)
WBC: 6.3 K/uL (ref 4.0–10.5)
nRBC: 0 % (ref 0.0–0.2)

## 2024-01-14 LAB — URINALYSIS, ROUTINE W REFLEX MICROSCOPIC
Bilirubin Urine: NEGATIVE
Glucose, UA: NEGATIVE mg/dL
Hgb urine dipstick: NEGATIVE
Ketones, ur: NEGATIVE mg/dL
Leukocytes,Ua: NEGATIVE
Nitrite: NEGATIVE
Protein, ur: NEGATIVE mg/dL
Specific Gravity, Urine: 1.017 (ref 1.005–1.030)
pH: 6 (ref 5.0–8.0)

## 2024-01-14 LAB — PROTIME-INR
INR: 1 (ref 0.8–1.2)
Prothrombin Time: 13.5 s (ref 11.4–15.2)

## 2024-01-14 LAB — BASIC METABOLIC PANEL WITH GFR
Anion gap: 11 (ref 5–15)
BUN: 7 mg/dL (ref 6–20)
CO2: 23 mmol/L (ref 22–32)
Calcium: 8.9 mg/dL (ref 8.9–10.3)
Chloride: 102 mmol/L (ref 98–111)
Creatinine, Ser: 0.7 mg/dL (ref 0.44–1.00)
GFR, Estimated: >60 mL/min (ref >60)
Glucose, Bld: 96 mg/dL (ref 70–99)
Potassium: 3.4 mmol/L — ABNORMAL LOW (ref 3.5–5.1)
Sodium: 136 mmol/L (ref 135–145)

## 2024-01-14 LAB — PREGNANCY, URINE: Preg Test, Ur: NEGATIVE

## 2024-01-14 MED ORDER — VERAPAMIL HCL 2.5 MG/ML IV SOLN
INTRAVENOUS | Status: AC
  Filled 2024-01-14: qty 2

## 2024-01-14 MED ORDER — NITROGLYCERIN 1 MG/10 ML FOR IR/CATH LAB
INTRA_ARTERIAL | Status: AC
  Filled 2024-01-14: qty 10

## 2024-01-14 MED ORDER — LIDOCAINE HCL 1 % IJ SOLN
INTRAMUSCULAR | Status: AC
  Filled 2024-01-14: qty 20

## 2024-01-14 MED ORDER — CEFAZOLIN SODIUM-DEXTROSE 2-4 GM/100ML-% IV SOLN: 2.0000 g | INTRAVENOUS | Status: DC

## 2024-01-14 MED ORDER — HEPARIN SODIUM (PORCINE) 1000 UNIT/ML IJ SOLN
INTRAMUSCULAR | Status: AC | PRN
  Administered 2024-01-14 (×2): 3000 [IU] via INTRAVENOUS

## 2024-01-14 MED ORDER — MIDAZOLAM HCL 2 MG/2ML IJ SOLN
INTRAMUSCULAR | Status: AC
  Filled 2024-01-14: qty 2

## 2024-01-14 MED ORDER — VERAPAMIL HCL 2.5 MG/ML IV SOLN: INTRA_ARTERIAL | Status: AC | PRN

## 2024-01-14 MED ORDER — MIDAZOLAM HCL 2 MG/2ML IJ SOLN
INTRAMUSCULAR | Status: AC | PRN
  Administered 2024-01-14 (×2): 1 mg via INTRAVENOUS

## 2024-01-14 MED ORDER — FENTANYL CITRATE (PF) 100 MCG/2ML IJ SOLN
INTRAMUSCULAR | Status: AC
Start: 2024-01-14 — End: 2024-01-14
  Filled 2024-01-14: qty 2

## 2024-01-14 MED ORDER — FENTANYL CITRATE (PF) 100 MCG/2ML IJ SOLN
INTRAMUSCULAR | Status: AC | PRN
  Administered 2024-01-14 (×2): 25 ug via INTRAVENOUS

## 2024-01-14 MED ORDER — CHLORHEXIDINE GLUCONATE CLOTH 2 % EX PADS: 6.0000 | MEDICATED_PAD | Freq: Once | CUTANEOUS | Status: DC

## 2024-01-14 MED ORDER — LIDOCAINE HCL 1 % IJ SOLN
20.0000 mL | Freq: Once | INTRAMUSCULAR | Status: AC
  Administered 2024-01-14 (×2): 10 mL

## 2024-01-14 MED ORDER — HEPARIN SODIUM (PORCINE) 1000 UNIT/ML IJ SOLN
INTRAMUSCULAR | Status: AC
Start: 2024-01-14 — End: 2024-01-14
  Filled 2024-01-14: qty 10

## 2024-01-14 MED ORDER — IOHEXOL 300 MG/ML  SOLN
50.0000 mL | Freq: Once | INTRAMUSCULAR | Status: AC | PRN
  Administered 2024-01-14 (×2): 25 mL via INTRA_ARTERIAL

## 2024-01-14 NOTE — Discharge Instructions (Signed)

## 2024-01-14 NOTE — H&P (Signed)
 Chief Complaint   Aneurysm  History of Present Illness  Tiffany [name] is a 38 year old woman I am seeing after admission for subarachnoid hemorrhage back in Feb 2025.  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 Evern.  Her hospital stay was largely uncomplicated by vasospasm or other complication.  She has been home over the last several weeks doing quite well.  She reports minimal headache at this point.  She is walking well.  No visual changes.  To review, the patient does have a medical history significant for poorly compliant hypertension and history of substance abuse including marijuana, cocaine, alcohol.  Past Medical History   Past Medical History:  Diagnosis Date   Addiction, marijuana (HCC)    Alcohol abuse    Chlamydia    Cocaine addiction (HCC)    Depression    History of concussion    Hypertension    does not take any meds    Past Surgical History   Past Surgical History:  Procedure Laterality Date   IR 3D INDEPENDENT WKST  06/03/2023   IR ANGIO INTRA EXTRACRAN SEL INTERNAL CAROTID BILAT MOD SED  06/03/2023   IR ANGIO VERTEBRAL SEL VERTEBRAL BILAT MOD SED  06/03/2023   IR ANGIOGRAM FOLLOW UP STUDY  06/03/2023   IR ANGIOGRAM FOLLOW UP STUDY  06/03/2023   IR ANGIOGRAM FOLLOW UP STUDY  06/03/2023   IR NEURO EACH ADD'L AFTER BASIC UNI LEFT (MS)  06/03/2023   IR TRANSCATH/EMBOLIZ  06/03/2023   IR US  GUIDE VASC ACCESS RIGHT  06/03/2023   RADIOLOGY WITH ANESTHESIA N/A 06/03/2023   Procedure: IR WITH ANESTHESIA;  Surgeon: de Macedo Rodrigues, [place], MD;  Location: [facility] OR;  Service: Radiology;  Laterality: N/A;   WISDOM TOOTH EXTRACTION  2007    Social History   Social History   Tobacco Use   Smoking status: Every Day    Current packs/day: 1.00    Average packs/day: 1 pack/day for 9.0 years (9.0 ttl pk-yrs)    Types: Cigarettes   Smokeless tobacco: Never  Substance Use Topics    Alcohol use: Yes    Alcohol/week: 13.0 standard drinks of alcohol    Types: 12 Cans of beer, 1 Standard drinks or equivalent per week    Comment: daily   Drug use: Yes    Frequency: 1.0 times per week    Types: Marijuana, Cocaine    Comment: cocaine use currently    Medications   Prior to Admission medications   Medication Sig Start Date End Date Taking? Authorizing Provider  melatonin 5 MG TABS Take 1 tablet (5 mg total) by mouth at bedtime. 06/17/23   Tex Drilling, NP  Multiple Vitamin (MULTIVITAMIN WITH MINERALS) TABS tablet Take 1 tablet by mouth daily. 06/14/23   Hongalgi, Anand D, MD  niMODipine  (NIMOTOP ) 30 MG capsule Take 2 capsules (60 mg total) by mouth 4 (four) times daily for 8 days. 06/17/23 06/25/23  Tex Drilling, NP  risperiDONE  (RISPERDAL ) 2 MG tablet Take 1 tablet (2 mg total) by mouth at bedtime. 06/17/23   Tex Drilling, NP  senna-docusate (SENOKOT-S) 8.6-50 MG tablet Take 1 tablet by mouth 2 (two) times daily. 06/13/23   Hongalgi, Anand D, MD  Vitamin D , Ergocalciferol , (DRISDOL ) 1.25 MG (50000 UNIT) CAPS capsule Take 1 capsule (50,000 Units total) by mouth every 7 (seven) days. 06/17/23   Tex Drilling, NP    Allergies   Allergies  Allergen Reactions  Shellfish Allergy Anaphylaxis   Pollen Extract    Aspirin Palpitations    Review of Systems  ROS  Neurologic Exam  Awake, alert, oriented Memory and concentration grossly intact Speech fluent, appropriate CN grossly intact Motor exam: Upper Extremities Deltoid Bicep Tricep Grip  Right 5/5 5/5 5/5 5/5  Left 5/5 5/5 5/5 5/5   Lower Extremities IP Quad PF DF EHL  Right 5/5 5/5 5/5 5/5 5/5  Left 5/5 5/5 5/5 5/5 5/5   Sensation grossly intact to LT  Impression  - 38 y.o. female 67mo s/p SAH and coil embolization of Acom aneurysm, doing well  Plan  - Will proceed with routine short-term angiogram  I have reviewed the indications for the procedure as well as the details of the procedure and the  expected postoperative course and recovery at length with the patient in the office. We have also reviewed in detail the risks, benefits, and alternatives to the procedure. All questions were answered and Tiffany [name] provided informed consent to proceed.  Tiffany Maizes, MD [facility]

## 2024-01-14 NOTE — Sedation Documentation (Signed)
 Patient transported to recovery area via stretcher. Bedside report given to RN. Femoral site assessed - Level 0, no hematoma, dressing is clean, dry, and intact. Pulses also assessed bilaterally.

## 2024-01-14 NOTE — Progress Notes (Signed)
 PT ambulated to and from the bathroom where she was able to void without difficulty. TR band removed no s/s of complications. Discharge instructions reviewed with patient, left message for Wylie Howard no call back. Pick up person confirmed pt would have 24 hour supervision upon discharge. Denies questions or concerns at this time. PT was escorted off the unit via wheel chair by staff to personal vehicle.

## 2024-01-21 NOTE — Op Note (Signed)
  ENDOVASCULAR NEUROSURGERY OPERATIVE NOTE   PROCEDURE: Diagnostic Cerebral Angiogram   SURGEON:   Dr. Gerldine Maizes, MD  HISTORY:   The patient is a 38 y.o. yo female with a history of subarachnoid hemorrhage approximately 6 months ago at which time she underwent coil embolization of a 5 mm anterior communicating artery aneurysm.  She presents today for routine short-term angiographic follow-up.  APPROACH:   The technical aspects of the procedure as well as its potential risks and benefits were reviewed with the patient. These risks included but were not limited bleeding, infection, allergic reaction, damage to organs/vital structures, stroke, non-diagnostic procedure, and the catastrophic outcomes of heart attack, coma, and death. With an understanding of these risks, informed consent was obtained and witnessed.    The patient was placed in the supine position on the angiography table and the skin of right groin prepped in the usual sterile fashion. The procedure was performed under local anesthesia (1%-solution of bicarbonate-bufferred Lidoacaine) and conscious sedation with Versed  and fentanyl  monitored by the in-suite nurse and myself, including non-invasive blood pressure and continuous pulse oxymetry.    Access to the right radial artery was obtained using ultrasound guidance and standard Seldinger technique.  Ultrasound was used to directly visualize access to the lumen of the right radial artery with the micropuncture needle.  A short 5 French glide slender sheath was then placed.  MEDICATIONS:  Heparin : 3000 Units total.   Verapamil : 2.5mg  Nitroglycerin : 100 mcg  CONTRAST AGENT:  See IR records   FLUOROSCOPY TIME:  See IR records    CATHETER(S) AND WIRE(S):    5-French Simmons-2 glidecatheter   0.035" glidewire    VESSELS CATHETERIZED:   Left internal carotid   Right radial  VESSELS STUDIED:   Left internal carotid, head Right radial  PROCEDURAL NARRATIVE:   A  5-Fr Simmons-2 catheter was advanced over a 0.035 glidewire into the aortic arch.  Secondary curve was reformed. The left internal carotid artery was then catheterized and cervical/cerebral angiograms taken. After review of images, the catheter was removed without incident.    INTERPRETATION:   Left internal carotid, head:   Injection reveals the presence of a widely patent ICA, M1, and A1 segments and their branches.  Coil mass is seen in the region of the anterior communicating artery complex.  No aneurysm filling or neck residual is noted.  The distal segments of the left anterior cerebral artery remain widely patent.  Capillary phase does not demonstrate any perfusion deficits or branch occlusions.  Venous sinuses are patent.  Right radial:    Normal vessel. No significant atherosclerotic disease. Arterial sheath in adequate position.   DISPOSITION:  Upon completion of the study, the radial sheath was removed and patent hemostasis was secured using a Terumo TR band.  The procedure was well tolerated and no early complications were observed.  The patient was transferred to the recovery area for further care.    IMPRESSION:  1.  Complete occlusion of an anterior communicating artery aneurysm 6 months after subarachnoid hemorrhage and primary coil embolization.    Gerldine Maizes, MD [facility]

## 2024-01-22 ENCOUNTER — Encounter (HOSPITAL_COMMUNITY): Payer: Self-pay

## 2024-04-25 ENCOUNTER — Other Ambulatory Visit: Payer: Self-pay

## 2024-04-25 ENCOUNTER — Encounter (HOSPITAL_COMMUNITY): Payer: Self-pay

## 2024-04-25 ENCOUNTER — Emergency Department (HOSPITAL_COMMUNITY)
Admission: EM | Admit: 2024-04-25 | Discharge: 2024-04-26 | Disposition: A | Discharge status: Home / Self Care | Payer: MEDICAID | Attending: Emergency Medicine | Admitting: Emergency Medicine

## 2024-04-25 DIAGNOSIS — F1910 Other psychoactive substance abuse, uncomplicated: Secondary | ICD-10-CM

## 2024-04-25 DIAGNOSIS — F32A Depression, unspecified: Secondary | ICD-10-CM | POA: Insufficient documentation

## 2024-04-25 DIAGNOSIS — I10 Essential (primary) hypertension: Secondary | ICD-10-CM | POA: Diagnosis not present

## 2024-04-25 DIAGNOSIS — R45851 Suicidal ideations: Secondary | ICD-10-CM

## 2024-04-25 DIAGNOSIS — F209 Schizophrenia, unspecified: Secondary | ICD-10-CM | POA: Diagnosis not present

## 2024-04-25 DIAGNOSIS — Z8659 Personal history of other mental and behavioral disorders: Secondary | ICD-10-CM

## 2024-04-25 DIAGNOSIS — F17210 Nicotine dependence, cigarettes, uncomplicated: Secondary | ICD-10-CM | POA: Diagnosis not present

## 2024-04-25 LAB — URINALYSIS, ROUTINE W REFLEX MICROSCOPIC
Bilirubin Urine: NEGATIVE
Glucose, UA: NEGATIVE mg/dL
Hgb urine dipstick: NEGATIVE
Ketones, ur: NEGATIVE mg/dL
Leukocytes,Ua: NEGATIVE
Nitrite: NEGATIVE
Protein, ur: NEGATIVE mg/dL
Specific Gravity, Urine: 1.008 (ref 1.005–1.030)
pH: 7 (ref 5.0–8.0)

## 2024-04-25 LAB — COMPREHENSIVE METABOLIC PANEL WITH GFR
ALT: 13 U/L (ref 0–44)
AST: 25 U/L (ref 15–41)
Albumin: 3.7 g/dL (ref 3.5–5.0)
Alkaline Phosphatase: 51 U/L (ref 38–126)
Anion gap: 10 (ref 5–15)
BUN: <5 mg/dL — ABNORMAL LOW (ref 6–20)
CO2: 22 mmol/L (ref 22–32)
Calcium: 8.4 mg/dL — ABNORMAL LOW (ref 8.9–10.3)
Chloride: 105 mmol/L (ref 98–111)
Creatinine, Ser: 0.71 mg/dL (ref 0.44–1.00)
GFR, Estimated: >60 mL/min (ref >60)
Glucose, Bld: 97 mg/dL (ref 70–99)
Potassium: 3.6 mmol/L (ref 3.5–5.1)
Sodium: 137 mmol/L (ref 135–145)
Total Bilirubin: 0.3 mg/dL (ref 0.0–1.2)
Total Protein: 6.5 g/dL (ref 6.5–8.1)

## 2024-04-25 LAB — RAPID URINE DRUG SCREEN, HOSP PERFORMED
Amphetamines: NOT DETECTED
Barbiturates: NOT DETECTED
Benzodiazepines: NOT DETECTED
Cocaine: POSITIVE — AB
Opiates: NOT DETECTED
Tetrahydrocannabinol: POSITIVE — AB

## 2024-04-25 LAB — CBC
HCT: 31.8 % — ABNORMAL LOW (ref 36.0–46.0)
Hemoglobin: 10.3 g/dL — ABNORMAL LOW (ref 12.0–15.0)
MCH: 28.1 pg (ref 26.0–34.0)
MCHC: 32.4 g/dL (ref 30.0–36.0)
MCV: 86.6 fL (ref 80.0–100.0)
Platelets: 418 K/uL — ABNORMAL HIGH (ref 150–400)
RBC: 3.67 MIL/uL — ABNORMAL LOW (ref 3.87–5.11)
RDW: 17.3 % — ABNORMAL HIGH (ref 11.5–15.5)
WBC: 4.1 K/uL (ref 4.0–10.5)
nRBC: 0 % (ref 0.0–0.2)

## 2024-04-25 LAB — HCG, SERUM, QUALITATIVE: Preg, Serum: NEGATIVE

## 2024-04-25 LAB — SARS CORONAVIRUS 2 BY RT PCR: SARS Coronavirus 2 by RT PCR: NEGATIVE

## 2024-04-25 LAB — ETHANOL: Alcohol, Ethyl (B): <15 mg/dL (ref <15)

## 2024-04-25 NOTE — ED Triage Notes (Signed)
 Pt requesting detox from alcohol, cocaine and marijuana. Pt last use was this morning. Pt arrives with mobile crisis. Pt has hx of PTSD and schizophrenia and is requesting a psych eval as well. Pt requesting STD check, denies sexual assault. Denies SI/HI

## 2024-04-25 NOTE — ED Provider Notes (Signed)
 [place] EMERGENCY DEPARTMENT AT [facility] Provider Note   CSN: [id number] Arrival date & time: 04/25/24  1120     Patient presents with: Drug / Alcohol Assessment   Tiffany [name] is a 38 y.o. female who presents for primary concern of requesting detox services for alcohol, cocaine, and marijuana.  Last use of all substances was this morning prior to her arrival.  States that she drank a bottle of wine earlier this morning approximately 1 to 2 hours prior to arrival to the ED today.  Denies any SI/HI, requests assistance with detoxification from substance abuse.  Previous diagnoses of polysubstance abuse, depression, schizoaffective disorder, and alcohol use disorder.  Review of medications does show she takes risperidone , uncertain of adherence to this.    Drug / Alcohol Assessment Associated symptoms: no abdominal pain, no headaches, no nausea, no shortness of breath, no vomiting and no weakness        Prior to Admission medications   Medication Sig Start Date End Date Taking? Authorizing Provider  melatonin 5 MG TABS Take 1 tablet (5 mg total) by mouth at bedtime. 06/17/23   Tex Drilling, NP  Multiple Vitamin (MULTIVITAMIN WITH MINERALS) TABS tablet Take 1 tablet by mouth daily. 06/14/23   Hongalgi, Anand D, MD  niMODipine  (NIMOTOP ) 30 MG capsule Take 2 capsules (60 mg total) by mouth 4 (four) times daily for 8 days. 06/17/23 06/25/23  Tex Drilling, NP  risperiDONE  (RISPERDAL ) 2 MG tablet Take 1 tablet (2 mg total) by mouth at bedtime. 06/17/23   Tex Drilling, NP  senna-docusate (SENOKOT-S) 8.6-50 MG tablet Take 1 tablet by mouth 2 (two) times daily. 06/13/23   Hongalgi, Anand D, MD  Vitamin D , Ergocalciferol , (DRISDOL ) 1.25 MG (50000 UNIT) CAPS capsule Take 1 capsule (50,000 Units total) by mouth every 7 (seven) days. 06/17/23   Tex Drilling, NP    Allergies: Bee venom, Shellfish allergy, Ibuprofen , Pollen extract, and Aspirin    Review of Systems   Constitutional:  Negative for chills and fever.  HENT:  Negative for sore throat.   Eyes:  Negative for visual disturbance.  Respiratory:  Negative for cough and shortness of breath.   Cardiovascular:  Negative for chest pain.  Gastrointestinal:  Negative for abdominal pain, diarrhea, nausea and vomiting.  Genitourinary:  Negative for dysuria and frequency.  Musculoskeletal:  Negative for back pain and neck pain.  Skin:  Negative for rash.  Neurological:  Negative for weakness, numbness and headaches.  Hematological:  Negative for adenopathy.  Psychiatric/Behavioral:  Negative for behavioral problems.     Updated Vital Signs BP 113/72   Pulse 85   Temp 98.5 F (36.9 C) (Oral)   Resp (!) 23   Ht 5' 6 (1.676 m)   Wt 59 kg   LMP 04/17/2024 (Approximate)   SpO2 100%   BMI 20.98 kg/m   Physical Exam Vitals and nursing note reviewed.  Constitutional:      General: She is awake. She is not in acute distress.    Appearance: Normal appearance. She is well-developed.  HENT:     Head: Normocephalic and atraumatic.  Eyes:     Conjunctiva/sclera: Conjunctivae normal.  Cardiovascular:     Rate and Rhythm: Normal rate and regular rhythm.     Heart sounds: No murmur heard. Pulmonary:     Effort: Pulmonary effort is normal. No respiratory distress.     Breath sounds: Normal breath sounds.  Abdominal:     Palpations: Abdomen is soft.  Tenderness: There is no abdominal tenderness.  Musculoskeletal:        General: No swelling.     Cervical back: Neck supple.  Skin:    General: Skin is warm and dry.     Capillary Refill: Capillary refill takes less than 2 seconds.  Neurological:     General: No focal deficit present.     Mental Status: She is alert and oriented to person, place, and time.     GCS: GCS eye subscore is 4. GCS verbal subscore is 5. GCS motor subscore is 6.     Comments: Normal speech however mild slurring but intelligible speech.  Psychiatric:        Attention  and Perception: Attention and perception normal.        Mood and Affect: Mood and affect normal.        Speech: Speech is slurred.        Behavior: Behavior is cooperative.     (all labs ordered are listed, but only abnormal results are displayed) Labs Reviewed  COMPREHENSIVE METABOLIC PANEL WITH GFR - Abnormal; Notable for the following components:      Result Value   BUN <5 (*)    Calcium 8.4 (*)    All other components within normal limits  CBC - Abnormal; Notable for the following components:   RBC 3.67 (*)    Hemoglobin 10.3 (*)    HCT 31.8 (*)    RDW 17.3 (*)    Platelets 418 (*)    All other components within normal limits  RAPID URINE DRUG SCREEN, HOSP PERFORMED - Abnormal; Notable for the following components:   Cocaine POSITIVE (*)    Tetrahydrocannabinol POSITIVE (*)    All other components within normal limits  SARS CORONAVIRUS 2 BY RT PCR  ETHANOL  HCG, SERUM, QUALITATIVE  URINALYSIS, ROUTINE W REFLEX MICROSCOPIC    EKG: None  Radiology: No results found.   Procedures   Medications Ordered in the ED - No data to display                                  Medical Decision Making Amount and/or Complexity of Data Reviewed Labs: ordered.   Medical Decision Making:   Tiffany [name] is a 38 y.o. female who presented to the ED today with request for drug and alcohol detox detailed above.     Complete initial physical exam performed, notably the patient  was alert and oriented in no apparent distress.  Physical exam is largely unremarkable..    Reviewed and confirmed nursing documentation for past medical history, family history, social history.    Initial Assessment:   With the patient's presentation of request for drug and alcohol detox, consider substance abuse for admitted use of cocaine, other polysubstance abuse, alcohol abuse.   Initial Plan:   Screening labs including CBC and Metabolic panel to evaluate for infectious or metabolic etiology of  disease.  Urinalysis with reflex culture ordered to evaluate for UTI or relevant urologic/nephrologic pathology.  Add UDS to confirm presence of controlled substances. Serum hCG to evaluate for pregnancy EKG ordered secondary to psychiatric clearance. Objective evaluation as below reviewed   Initial Study Results:   Laboratory  All laboratory results reviewed without evidence of clinically relevant pathology.   Exceptions include: UDS is positive for cocaine and THC.  EKG EKG pending  Reassessment and Plan:   Initial plan at reassessment  was discharged to outpatient detox with resources to be provided to the patient.  When this was discussed with the patient patient verbalized suicidal intent with no clear plan.  Suspect secondary intent.  Though secondary to verbalized suicidal ideation, TTS consultation was placed in psychiatric hold orders placed.  Care signed out to S. Upstill, PA-C for psychiatric dispo.       Final diagnoses:  Polysubstance abuse [facility])  Suicidal ideation    ED Discharge Orders     None          Myriam Dorn BROCKS, [place] 04/25/24 1525    Garrick Charleston, MD 04/26/24 [phone]
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
## 2024-04-25 NOTE — ED Notes (Signed)
 PA at [facility].

## 2024-04-25 NOTE — ED Triage Notes (Signed)
 Here for detox from alcohol , cocaine and weed

## 2024-04-25 NOTE — BH Assessment (Signed)
 Clinician spoke to [facility] with [facility] to complete pt's TTS assessment. Clinician provided pt's name, MRN, location, age, room number. Secure message completed. [facility] coordinator to update secure chat when assessment time and provider are assigned.
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
## 2024-04-26 NOTE — ED Provider Notes (Signed)
 Emergency Medicine Observation Re-evaluation Note  Tiffany [name] is a 38 y.o. female, seen on rounds today.  Pt initially presented to the ED for complaints of Drug / Alcohol Assessment Currently, the patient is awake and alert sitting, calmly.  Physical Exam  BP 108/64   Pulse 70   Temp 98.4 F (36.9 C) (Oral)   Resp 16   Ht 1.676 m (5' 6)   Wt 59 kg   LMP 04/17/2024 (Approximate)   SpO2 100%   BMI 20.98 kg/m  Physical Exam General: Adult female in no distress  Psych: Calm, pleasantly interactive  ED Course / MDM  EKG:   I have reviewed the labs performed to date as well as medications administered while in observation.  Recent changes in the last 24 hours include assessment from behavioral health clearance for discharge with outpatient follow-up.  Patient aware of plan for discharge, she will be provided homeless resources, substance abuse counseling resources.  No evidence for decompensated state, no distress, no hemodynamic instability.  Plan  Current plan is for discharge as above.    Garrick Charleston, MD 04/26/24 [phone]

## 2024-04-26 NOTE — ED Notes (Signed)
 Pt has blocked herself in her room, talking with pt through the door, explained to pt that the psyc np and md had already spoken with her and she was to be discharged, read and reviewed d/c instructions to pt through the door and explained community resources such a [facility], pt verbalized understanding existence of [facility].  Pt the refused to open the door, explained to pt that if she would like to continue talking she would need to open the door and I would get her more food and a buss pass, if not, security would be escorting her from the department.  Pt not opening the door, security opening door, belonging returned to pt, three bags and some shoes, pt refusing to confirm identity or comply with security, PD at bedside, pt escorted from department by security and PD with belongings.

## 2024-04-26 NOTE — Discharge Instructions (Signed)
 It is very important that you follow-up with your physician.  Use the provided resources for assistance with rehab as well as homelessness if you are under these challenges.

## 2024-04-27 ENCOUNTER — Ambulatory Visit (HOSPITAL_COMMUNITY)
Admission: EM | Admit: 2024-04-27 | Discharge: 2024-04-28 | Disposition: A | Discharge status: Other Acute Inpatient Hospital | Payer: MEDICAID | Attending: Psychiatry | Admitting: Psychiatry

## 2024-04-27 DIAGNOSIS — F1090 Alcohol use, unspecified, uncomplicated: Secondary | ICD-10-CM

## 2024-04-27 DIAGNOSIS — F1410 Cocaine abuse, uncomplicated: Secondary | ICD-10-CM | POA: Diagnosis not present

## 2024-04-27 DIAGNOSIS — F250 Schizoaffective disorder, bipolar type: Secondary | ICD-10-CM | POA: Insufficient documentation

## 2024-04-27 DIAGNOSIS — F1994 Other psychoactive substance use, unspecified with psychoactive substance-induced mood disorder: Secondary | ICD-10-CM | POA: Diagnosis not present

## 2024-04-27 DIAGNOSIS — F1020 Alcohol dependence, uncomplicated: Secondary | ICD-10-CM | POA: Insufficient documentation

## 2024-04-27 DIAGNOSIS — Z91148 Patient's other noncompliance with medication regimen for other reason: Secondary | ICD-10-CM | POA: Insufficient documentation

## 2024-04-27 LAB — POCT URINE DRUG SCREEN - MANUAL ENTRY (I-SCREEN)
POC Amphetamine UR: NOT DETECTED
POC Buprenorphine (BUP): NOT DETECTED
POC Cocaine UR: POSITIVE — AB
POC Marijuana UR: POSITIVE — AB
POC Methadone UR: NOT DETECTED
POC Methamphetamine UR: NOT DETECTED
POC Morphine: NOT DETECTED
POC Oxazepam (BZO): NOT DETECTED
POC Oxycodone UR: NOT DETECTED
POC Secobarbital (BAR): NOT DETECTED

## 2024-04-27 MED ORDER — MAGNESIUM HYDROXIDE 400 MG/5ML PO SUSP: 30.0000 mL | Freq: Every day | ORAL | Status: DC | PRN

## 2024-04-27 MED ORDER — HALOPERIDOL 5 MG PO TABS
5.0000 mg | ORAL_TABLET | Freq: Three times a day (TID) | ORAL | Status: DC | PRN
  Administered 2024-04-27: 5 mg via ORAL
  Filled 2024-04-27: qty 1

## 2024-04-27 MED ORDER — LORAZEPAM 1 MG PO TABS: 1.0000 mg | ORAL_TABLET | Freq: Four times a day (QID) | ORAL | Status: DC | PRN

## 2024-04-27 MED ORDER — LOPERAMIDE HCL 2 MG PO CAPS: 2.0000 mg | ORAL_CAPSULE | ORAL | Status: DC | PRN

## 2024-04-27 MED ORDER — THIAMINE HCL 100 MG/ML IJ SOLN
100.0000 mg | Freq: Once | INTRAMUSCULAR | Status: AC
  Administered 2024-04-27: 100 mg via INTRAMUSCULAR
  Filled 2024-04-27: qty 2

## 2024-04-27 MED ORDER — HYDROXYZINE HCL 25 MG PO TABS
25.0000 mg | ORAL_TABLET | Freq: Three times a day (TID) | ORAL | Status: DC | PRN
  Filled 2024-04-27: qty 1

## 2024-04-27 MED ORDER — ONDANSETRON 4 MG PO TBDP: 4.0000 mg | ORAL_TABLET | Freq: Four times a day (QID) | ORAL | Status: DC | PRN

## 2024-04-27 MED ORDER — LORAZEPAM 2 MG/ML IJ SOLN: 2.0000 mg | Freq: Three times a day (TID) | INTRAMUSCULAR | Status: DC | PRN

## 2024-04-27 MED ORDER — THIAMINE MONONITRATE 100 MG PO TABS
100.0000 mg | ORAL_TABLET | Freq: Every day | ORAL | Status: DC
  Filled 2024-04-27: qty 1

## 2024-04-27 MED ORDER — DIPHENHYDRAMINE HCL 50 MG PO CAPS
50.0000 mg | ORAL_CAPSULE | Freq: Three times a day (TID) | ORAL | Status: DC | PRN
  Administered 2024-04-27: 50 mg via ORAL
  Filled 2024-04-27: qty 1

## 2024-04-27 MED ORDER — ADULT MULTIVITAMIN W/MINERALS CH
1.0000 | ORAL_TABLET | Freq: Every day | ORAL | Status: DC
  Administered 2024-04-27: 1 via ORAL
  Filled 2024-04-27 (×2): qty 1

## 2024-04-27 MED ORDER — ALUM & MAG HYDROXIDE-SIMETH 200-200-20 MG/5ML PO SUSP: 30.0000 mL | ORAL | Status: DC | PRN

## 2024-04-27 MED ORDER — HYDROXYZINE HCL 25 MG PO TABS
25.0000 mg | ORAL_TABLET | Freq: Four times a day (QID) | ORAL | Status: DC | PRN
  Administered 2024-04-27: 25 mg via ORAL

## 2024-04-27 MED ORDER — DIPHENHYDRAMINE HCL 50 MG/ML IJ SOLN: 50.0000 mg | Freq: Three times a day (TID) | INTRAMUSCULAR | Status: DC | PRN

## 2024-04-27 MED ORDER — HALOPERIDOL LACTATE 5 MG/ML IJ SOLN: 5.0000 mg | Freq: Three times a day (TID) | INTRAMUSCULAR | Status: DC | PRN

## 2024-04-27 MED ORDER — HALOPERIDOL LACTATE 5 MG/ML IJ SOLN: 10.0000 mg | Freq: Three times a day (TID) | INTRAMUSCULAR | Status: DC | PRN

## 2024-04-27 MED ORDER — OLANZAPINE 5 MG PO TABS
5.0000 mg | ORAL_TABLET | Freq: Every day | ORAL | Status: DC
  Administered 2024-04-27: 5 mg via ORAL
  Filled 2024-04-27: qty 1

## 2024-04-27 MED ORDER — ACETAMINOPHEN 325 MG PO TABS: 650.0000 mg | ORAL_TABLET | Freq: Four times a day (QID) | ORAL | Status: DC | PRN

## 2024-04-27 MED ORDER — TRAZODONE HCL 50 MG PO TABS
50.0000 mg | ORAL_TABLET | Freq: Every evening | ORAL | Status: DC | PRN
  Administered 2024-04-27: 50 mg via ORAL
  Filled 2024-04-27: qty 1

## 2024-04-27 NOTE — ED Notes (Signed)
 Pt re

## 2024-04-27 NOTE — ED Notes (Signed)
 Patient arrived to adult [facility] side. Patient is cooperative and able to answer admission questions. States that she uses marijuana and crack cocaine. Last  use of crack cocaine was this morning.Patient states that lives with roommate and sometimes they get into arguments. Has friend Ozell that is their support system. Denies SI/HI and ACH at this time but states has endorese SI due to her work situation. Safety measures and POC initiated.

## 2024-04-27 NOTE — Progress Notes (Signed)
 Pt has been accepted to [facility] on 04/27/2024  assignment: Main campus  Pt meets inpatient criteria per  Elveria Batter, NP    Attending Physician will be Millie Manners, MD  Report can be called to: [phone] (this is a pager, please leave call-back number when giving report)  Pt can arrive after 8 AM  Care Team Notified: Rafe Gerold, RN, Elveria Batter, NP
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
## 2024-04-27 NOTE — Progress Notes (Signed)
 Inpatient Psychiatric Referral  Patient was recommended inpatient per [facility] . There are no available beds at [facility], per [facility] . Patient was referred to the following out of network facilities:  Destination  Service Provider Address Phone Fax  [facility]  [address] [place] [address] [phone]  [facility]  [address], [place] [address] [phone]  [facility]  [address] [place] [address] [phone]  [facility]  [address] [place] [address] [phone]  [facility] EFAX  [address] [place], [place] [phone]  [facility]  [address], [place] [address] [phone]  [facility]  [address] [place], [place] [address] [phone]  [facility]  [address] [place], [place] [address] [phone]  [facility]  [address] [place] [address] [phone]    Situation ongoing, CSW to continue following and update chart as more information becomes available.   Harrie Sofia MSW, [place] 04/27/2024

## 2024-04-27 NOTE — Progress Notes (Signed)
   04/27/24 1411  [facility] Triage Screening (Walk-ins at [facility] only)  How Did You Hear About Us ? Legal System  What Is the Reason for Your Visit/Call Today? Hornbeck is a 38 year old female presenting to [facility] escorted by [facility]. Pt states that she is looking to detox off of Cocaine, Alcohol and Marijuana. Pt states that she was thinking about drinking bleach today. Pt states that she smokes Cocaine and drinks Alcohol daily. Pt is also diagnosed with PTSD and Insomnia. Pt reports she has struggled with Cocaine and Alcohol for years and is unable to find ways to stop using, she stated. Pt denies SI, Hi and AVH.  How Long Has This Been Causing You Problems? > than 6 months  Have You Recently Had Any Thoughts About Hurting Yourself? No  Are You Planning to Commit Suicide/Harm Yourself At This time? No  Have you Recently Had Thoughts About Hurting Someone [name]? No  Are You Planning To Harm Someone At This Time? No  Exploitation of patient/patient's resources Denies  Self-Neglect Denies  Possible abuse reported to: Other (Comment)  Are you currently experiencing any auditory, visual or other hallucinations? No  Do you have any current medical co-morbidities that require immediate attention? No  What Do You Feel Would Help You the Most Today? Alcohol or Drug Use Treatment  If access to [facility] was not available, would you have sought care in the Emergency Department? No  Determination of Need Urgent (48 hours)  Options For Referral Facility-Based Crisis  Determination of Need filed? Yes

## 2024-04-27 NOTE — ED Notes (Signed)
 Pt presents with mood lability from nice to threatening, unhappy with trazodone  dosage stating in a loud tone , I get 100 mg of trazodone  and Im not taking anything until I don and I am not taking Effexor Pt. Began cursing loudly.  Verbally de-escalated patient . She took trazadone and Zyprexa  5 mg at 2200. Then patient became angry approaching the nursing station with a loud tone, boistrous pt given haldol  5 mg and, benadryl  5 mg.  Pt de-escalated by staff sleeping presently. Will monitor for safety.  CiWA score at 8PM =0.  Will continue to provide safety

## 2024-04-28 ENCOUNTER — Other Ambulatory Visit (HOSPITAL_COMMUNITY)
Admission: EM | Admit: 2024-04-28 | Discharge: 2024-05-04 | Disposition: A | Discharge status: Home / Self Care | Payer: MEDICAID

## 2024-04-28 ENCOUNTER — Encounter (HOSPITAL_COMMUNITY): Payer: Self-pay

## 2024-04-28 DIAGNOSIS — F1290 Cannabis use, unspecified, uncomplicated: Secondary | ICD-10-CM | POA: Insufficient documentation

## 2024-04-28 DIAGNOSIS — F1510 Other stimulant abuse, uncomplicated: Secondary | ICD-10-CM | POA: Insufficient documentation

## 2024-04-28 DIAGNOSIS — Z9151 Personal history of suicidal behavior: Secondary | ICD-10-CM | POA: Insufficient documentation

## 2024-04-28 DIAGNOSIS — F1010 Alcohol abuse, uncomplicated: Secondary | ICD-10-CM | POA: Insufficient documentation

## 2024-04-28 DIAGNOSIS — F419 Anxiety disorder, unspecified: Secondary | ICD-10-CM | POA: Insufficient documentation

## 2024-04-28 DIAGNOSIS — F1020 Alcohol dependence, uncomplicated: Secondary | ICD-10-CM | POA: Diagnosis not present

## 2024-04-28 DIAGNOSIS — Z653 Problems related to other legal circumstances: Secondary | ICD-10-CM | POA: Insufficient documentation

## 2024-04-28 DIAGNOSIS — F1910 Other psychoactive substance abuse, uncomplicated: Secondary | ICD-10-CM

## 2024-04-28 DIAGNOSIS — Z79899 Other long term (current) drug therapy: Secondary | ICD-10-CM | POA: Insufficient documentation

## 2024-04-28 DIAGNOSIS — F1090 Alcohol use, unspecified, uncomplicated: Secondary | ICD-10-CM

## 2024-04-28 DIAGNOSIS — R45851 Suicidal ideations: Secondary | ICD-10-CM | POA: Insufficient documentation

## 2024-04-28 DIAGNOSIS — F4310 Post-traumatic stress disorder, unspecified: Secondary | ICD-10-CM | POA: Insufficient documentation

## 2024-04-28 DIAGNOSIS — F1410 Cocaine abuse, uncomplicated: Secondary | ICD-10-CM | POA: Insufficient documentation

## 2024-04-28 DIAGNOSIS — F1994 Other psychoactive substance use, unspecified with psychoactive substance-induced mood disorder: Secondary | ICD-10-CM | POA: Insufficient documentation

## 2024-04-28 DIAGNOSIS — F1590 Other stimulant use, unspecified, uncomplicated: Secondary | ICD-10-CM

## 2024-04-28 DIAGNOSIS — F251 Schizoaffective disorder, depressive type: Secondary | ICD-10-CM | POA: Insufficient documentation

## 2024-04-28 DIAGNOSIS — F250 Schizoaffective disorder, bipolar type: Secondary | ICD-10-CM | POA: Diagnosis not present

## 2024-04-28 MED ORDER — DIPHENHYDRAMINE HCL 50 MG/ML IJ SOLN: 50.0000 mg | Freq: Three times a day (TID) | INTRAMUSCULAR | Status: DC | PRN

## 2024-04-28 MED ORDER — ACETAMINOPHEN 325 MG PO TABS
650.0000 mg | ORAL_TABLET | Freq: Four times a day (QID) | ORAL | Status: DC | PRN
  Administered 2024-05-01 – 2024-05-03 (×2): 650 mg via ORAL
  Filled 2024-04-28 (×2): qty 2

## 2024-04-28 MED ORDER — DIPHENHYDRAMINE HCL 50 MG PO CAPS: 50.0000 mg | ORAL_CAPSULE | Freq: Three times a day (TID) | ORAL | Status: DC | PRN

## 2024-04-28 MED ORDER — MAGNESIUM HYDROXIDE 400 MG/5ML PO SUSP
30.0000 mL | Freq: Every day | ORAL | Status: DC | PRN
  Administered 2024-05-01: 30 mL via ORAL
  Filled 2024-04-28: qty 30

## 2024-04-28 MED ORDER — TRAZODONE HCL 100 MG PO TABS
100.0000 mg | ORAL_TABLET | Freq: Every evening | ORAL | Status: DC | PRN
  Administered 2024-04-28 – 2024-04-30 (×3): 100 mg via ORAL
  Filled 2024-04-28 (×4): qty 1

## 2024-04-28 MED ORDER — HALOPERIDOL 5 MG PO TABS: 5.0000 mg | ORAL_TABLET | Freq: Three times a day (TID) | ORAL | Status: DC | PRN

## 2024-04-28 MED ORDER — ONDANSETRON 4 MG PO TBDP: 4.0000 mg | ORAL_TABLET | Freq: Four times a day (QID) | ORAL | Status: AC | PRN

## 2024-04-28 MED ORDER — LORAZEPAM 2 MG/ML IJ SOLN: 2.0000 mg | Freq: Three times a day (TID) | INTRAMUSCULAR | Status: DC | PRN

## 2024-04-28 MED ORDER — HALOPERIDOL LACTATE 5 MG/ML IJ SOLN: 5.0000 mg | Freq: Three times a day (TID) | INTRAMUSCULAR | Status: DC | PRN

## 2024-04-28 MED ORDER — HYDROXYZINE HCL 25 MG PO TABS
25.0000 mg | ORAL_TABLET | Freq: Four times a day (QID) | ORAL | Status: AC | PRN
  Filled 2024-04-28: qty 1

## 2024-04-28 MED ORDER — LOPERAMIDE HCL 2 MG PO CAPS: 2.0000 mg | ORAL_CAPSULE | ORAL | Status: AC | PRN

## 2024-04-28 MED ORDER — ALUM & MAG HYDROXIDE-SIMETH 200-200-20 MG/5ML PO SUSP: 30.0000 mL | ORAL | Status: DC | PRN

## 2024-04-28 MED ORDER — OLANZAPINE 5 MG PO TABS
5.0000 mg | ORAL_TABLET | Freq: Every day | ORAL | Status: DC
  Filled 2024-04-28: qty 1

## 2024-04-28 MED ORDER — HALOPERIDOL LACTATE 5 MG/ML IJ SOLN: 10.0000 mg | Freq: Three times a day (TID) | INTRAMUSCULAR | Status: DC | PRN

## 2024-04-28 MED ORDER — HYDROXYZINE HCL 25 MG PO TABS
25.0000 mg | ORAL_TABLET | Freq: Three times a day (TID) | ORAL | Status: DC | PRN
  Administered 2024-04-30 – 2024-05-02 (×3): 25 mg via ORAL
  Filled 2024-04-28 (×3): qty 1

## 2024-04-28 MED ORDER — LORAZEPAM 1 MG PO TABS: 1.0000 mg | ORAL_TABLET | Freq: Four times a day (QID) | ORAL | Status: AC | PRN

## 2024-04-28 MED ORDER — HYDROXYZINE HCL 25 MG PO TABS: 25.0000 mg | ORAL_TABLET | Freq: Three times a day (TID) | ORAL | Status: DC | PRN

## 2024-04-28 MED ORDER — THIAMINE MONONITRATE 100 MG PO TABS
100.0000 mg | ORAL_TABLET | Freq: Every day | ORAL | Status: DC
  Administered 2024-05-01 – 2024-05-04 (×4): 100 mg via ORAL
  Filled 2024-04-28 (×5): qty 1

## 2024-04-28 MED ORDER — ADULT MULTIVITAMIN W/MINERALS CH
1.0000 | ORAL_TABLET | Freq: Every day | ORAL | Status: DC
  Administered 2024-05-01 – 2024-05-04 (×4): 1 via ORAL
  Filled 2024-04-28 (×5): qty 1

## 2024-04-28 MED ORDER — TRAZODONE HCL 50 MG PO TABS: 50.0000 mg | ORAL_TABLET | Freq: Every evening | ORAL | Status: DC | PRN

## 2024-04-28 NOTE — Care Management (Addendum)
 [facility]BC Care Management...  Addendum 3:12 pm  Writer reached out to LCG  Patient needs to call and complete phone screening  Patient provided LCG number   Addendum 1:30 pm  [facility] [phone] Officer Jama cell [phone]   Writer left HIPAA compliant message at cell phone listed above  Writer met with patient to discuss discharge planning..   Patient reported being on probation. Provided [employer]. Patient stated ok to contact if needed to advised of current location.  Patient reported wanting inpatient treatment and is ok with going outside of [facility]  Patient reported [facility]  Writer will fax referrals to [facility], and [facility] [place]

## 2024-04-28 NOTE — ED Notes (Signed)
Pt is sleeping now. No acute distress noted.

## 2024-04-28 NOTE — Discharge Instructions (Signed)
Patient is instructed prior to discharge to:  Take all medications as prescribed by his/her mental healthcare provider. Report any adverse effects and or reactions from the medicines to his/her outpatient provider promptly. Keep all scheduled appointments, to ensure that you are getting refills on time and to avoid any interruption in your medication.  If you are unable to keep an appointment call to reschedule.  Be sure to follow-up with resources and follow-up appointments provided.  Patient has been instructed & cautioned: To not engage in alcohol and or illegal drug use while on prescription medicines. In the event of worsening symptoms, patient is instructed to call the crisis hotline, 911 and or go to the nearest ED for appropriate evaluation and treatment of symptoms. To follow-up with his/her primary care provider for your other medical issues, concerns and or health care needs.    
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
## 2024-04-28 NOTE — ED Notes (Signed)
 Patient was reminded this morning of her pending transfer to [facility] today after 8 am. Patient advised that she does not want to go to [facility]. Dr. Idelle was made aware, per provider recommendation is for [facility]. RN from [facility] made aware.

## 2024-04-28 NOTE — Group Note (Signed)
 Group Topic: Social Support  Group Date: 04/28/2024 Start Time: 2000 End Time: 2030 Facilitators: Joan Plowman B  Department: [facility]  Number of Participants: 1  Group Focus: goals/reality orientation Treatment Modality:  Psychoeducation Interventions utilized were leisure development Purpose: express feelings  Name: Tiffany [name] Date of Birth: 15-Dec-1985  MR: [id number]    Level of Participation: PT DID NOT ATTEND GROUPS   Patients Problems:  Patient Active Problem List   Diagnosis Date Noted   Cocaine use disorder (HCC) 06/14/2023   Delta-9-tetrahydrocannabinol (THC) dependence (HCC) 06/14/2023   Alcohol use disorder 06/14/2023   Schizoaffective disorder, bipolar type (HCC) 06/13/2023   SAH (subarachnoid hemorrhage) (HCC) 06/03/2023   Acute pyelonephritis 08/17/2019   SIRS (systemic inflammatory response syndrome) (HCC) 08/17/2019   Hypokalemia 08/17/2019   Incomplete miscarriage 05/13/2012   Polysubstance abuse (HCC) 10/21/2011   Depression 10/21/2011

## 2024-04-28 NOTE — ED Notes (Signed)
 Patient alert and oriented 4 no c/o pain. She denies all SI/HI and denies AH/VH. Patient has mild agitation refused medications. Report given to receiving nurse Brittany

## 2024-04-28 NOTE — ED Provider Notes (Signed)
 FBC/OBS ASAP Discharge Summary  Date and Time: 04/28/2024 6:55 AM  Name: Tiffany [name]  MRN:  [id number]   Discharge Diagnoses:  Final diagnoses:  Substance induced mood disorder (HCC)  Cocaine use disorder (HCC)  Alcohol use disorder  Schizoaffective disorder, bipolar type [facility])   Subjective:  Stay Summary: Tiffany [name] is a 38 y.o. female. patient presented to [facility] as a walk in accompanied by [facility] team voluntarily requesting detoxification from alcohol and cocaine dependency.  Patient also endorsed SI with plan to walk into traffic on arrival, however denies all SI/HI/AVH on day of discharge.  Patient was admitted to observation unit overnight, and reassessed in the morning.  Patient was accepted to [facility] last night, however upon hearing this news this morning, patient was upset and disagreeable to inpatient psychiatric hospitalization.  Patient requested to be discharged, however upon further de-escalation patient agreeable to transfer to [facility] unit for detoxification while awaiting residential rehabilitation placement.  Patient denying any physical complaints currently, and has no concerns.  Total Time spent with patient: 15 minutes  Past Psychiatric History: Prior diagnoses of substance-induced mood disorder, chronically noncompliant with medications Past Medical History: Denies Family History: Denies Social History: [employer], smokes crack cocaine all day every day, and drinking a bottle of wine every other day Tobacco Cessation:  N/A, patient does not currently use tobacco products  Current Medications:  Current Facility-Administered Medications  Medication Dose Route Frequency Provider Last Rate Last Admin   acetaminophen  (TYLENOL ) tablet 650 mg  650 mg Oral Q6H PRN Coleman, Carolyn H, NP       alum & mag hydroxide-simeth (MAALOX/MYLANTA) 200-200-20 MG/5ML suspension 30 mL  30 mL Oral Q4H PRN Coleman, Carolyn H, NP       haloperidol  (HALDOL ) tablet 5  mg  5 mg Oral TID PRN Mardy Elveria DEL, NP   5 mg at 04/27/24 2210   And   diphenhydrAMINE  (BENADRYL ) capsule 50 mg  50 mg Oral TID PRN Coleman, Carolyn H, NP   50 mg at 04/27/24 2210   haloperidol  lactate (HALDOL ) injection 5 mg  5 mg Intramuscular TID PRN Mardy Elveria DEL, NP       And   diphenhydrAMINE  (BENADRYL ) injection 50 mg  50 mg Intramuscular TID PRN Mardy Elveria DEL, NP       And   LORazepam  (ATIVAN ) injection 2 mg  2 mg Intramuscular TID PRN Coleman, Carolyn H, NP       haloperidol  lactate (HALDOL ) injection 10 mg  10 mg Intramuscular TID PRN Mardy Elveria DEL, NP       And   diphenhydrAMINE  (BENADRYL ) injection 50 mg  50 mg Intramuscular TID PRN Mardy Elveria DEL, NP       And   LORazepam  (ATIVAN ) injection 2 mg  2 mg Intramuscular TID PRN Coleman, Carolyn H, NP       hydrOXYzine  (ATARAX ) tablet 25 mg  25 mg Oral TID PRN Coleman, Carolyn H, NP       hydrOXYzine  (ATARAX ) tablet 25 mg  25 mg Oral Q6H PRN Coleman, Carolyn H, NP   25 mg at 04/27/24 2209   loperamide  (IMODIUM ) capsule 2-4 mg  2-4 mg Oral PRN Coleman, Carolyn H, NP       LORazepam  (ATIVAN ) tablet 1 mg  1 mg Oral Q6H PRN Coleman, Carolyn H, NP       magnesium  hydroxide (MILK OF MAGNESIA) suspension 30 mL  30 mL Oral Daily PRN Mardy Elveria DEL, NP  multivitamin with minerals tablet 1 tablet  1 tablet Oral Daily Mardy Elveria DEL, NP   1 tablet at 04/27/24 1646   OLANZapine  (ZYPREXA ) tablet 5 mg  5 mg Oral QHS Coleman, Carolyn H, NP   5 mg at 04/27/24 2144   ondansetron  (ZOFRAN -ODT) disintegrating tablet 4 mg  4 mg Oral Q6H PRN Coleman, Carolyn H, NP       thiamine  (VITAMIN B1) tablet 100 mg  100 mg Oral Daily Coleman, Carolyn H, NP       traZODone  (DESYREL ) tablet 50 mg  50 mg Oral QHS PRN Coleman, Carolyn H, NP   50 mg at 04/27/24 2144   Current Outpatient Medications  Medication Sig Dispense Refill   venlafaxine XR (EFFEXOR-XR) 37.5 MG 24 hr capsule Take 37.5 mg by mouth daily with breakfast. LF  07/29/23     traZODone  (DESYREL ) 100 MG tablet Take 100 mg by mouth at bedtime.      PTA Medications:  Facility Ordered Medications  Medication   acetaminophen  (TYLENOL ) tablet 650 mg   alum & mag hydroxide-simeth (MAALOX/MYLANTA) 200-200-20 MG/5ML suspension 30 mL   magnesium  hydroxide (MILK OF MAGNESIA) suspension 30 mL   haloperidol  (HALDOL ) tablet 5 mg   And   diphenhydrAMINE  (BENADRYL ) capsule 50 mg   haloperidol  lactate (HALDOL ) injection 5 mg   And   diphenhydrAMINE  (BENADRYL ) injection 50 mg   And   LORazepam  (ATIVAN ) injection 2 mg   haloperidol  lactate (HALDOL ) injection 10 mg   And   diphenhydrAMINE  (BENADRYL ) injection 50 mg   And   LORazepam  (ATIVAN ) injection 2 mg   hydrOXYzine  (ATARAX ) tablet 25 mg   traZODone  (DESYREL ) tablet 50 mg   [COMPLETED] thiamine  (VITAMIN B1) injection 100 mg   thiamine  (VITAMIN B1) tablet 100 mg   multivitamin with minerals tablet 1 tablet   LORazepam  (ATIVAN ) tablet 1 mg   hydrOXYzine  (ATARAX ) tablet 25 mg   loperamide  (IMODIUM ) capsule 2-4 mg   ondansetron  (ZOFRAN -ODT) disintegrating tablet 4 mg   OLANZapine  (ZYPREXA ) tablet 5 mg   PTA Medications  Medication Sig   traZODone  (DESYREL ) 100 MG tablet Take 100 mg by mouth at bedtime.        No data to display          Flowsheet Row ED from 04/27/2024 in [facility] ED from 04/25/2024 in [facility] Emergency Department at [facility] IR CNSA ANG VRTBL SEL VRTBL BI from 01/14/2024 in [facility] INTERVENTIONAL RADIOLOGY  C-SSRS RISK CATEGORY Low Risk No Risk No Risk    Musculoskeletal  Strength & Muscle Tone: within normal limits Gait & Station: normal Patient leans: N/A  Psychiatric Specialty Exam  Presentation  General Appearance:  Disheveled  Eye Contact: Good  Speech: Normal Rate  Speech Volume: Normal  Handedness: Right  Mood and Affect  Mood: Anxious  Affect: Congruent  Thought Process   Thought Processes: Coherent  Descriptions of Associations:Intact  Orientation:Full (Time, Place and Person)  Thought Content:Logical  Diagnosis of Schizophrenia or Schizoaffective disorder in past: Yes    Hallucinations:Hallucinations: None  Ideas of Reference:None  Suicidal Thoughts:Suicidal Thoughts: No  Homicidal Thoughts:Homicidal Thoughts: No  Sensorium  Memory: Immediate Fair; Recent Fair; Remote Fair  Judgment: Impaired  Insight: Lacking  Executive Functions  Concentration: Fair  Attention Span: Fair  Recall: [employer] of Knowledge: Fair  Language: Fair   Psychomotor Activity  Psychomotor Activity: Psychomotor Activity: Restlessness  Assets  Assets: Communication Skills; Desire for Improvement;  Resilience; Physical Health  Sleep  Sleep: Sleep: Poor  No Safety Checks orders active in given range  Nutritional Assessment (For OBS and FBC admissions only) Has the patient had a weight loss or gain of 10 pounds or more in the last 3 months?: No Has the patient had a decrease in food intake/or appetite?: No Does the patient have dental problems?: No Does the patient have eating habits or behaviors that may be indicators of an eating disorder including binging or inducing vomiting?: No Has the patient recently lost weight without trying?: 0 Has the patient been eating poorly because of a decreased appetite?: 0 Malnutrition Screening Tool Score: 0    Physical Exam  Physical Exam Constitutional:      General: She is not in acute distress.    Appearance: She is not toxic-appearing or diaphoretic.  Pulmonary:     Effort: Pulmonary effort is normal.  Neurological:     General: No focal deficit present.    Review of Systems  Constitutional:  Negative for diaphoresis.  Gastrointestinal:  Negative for abdominal pain.  Neurological:  Negative for dizziness, tremors and headaches.  Psychiatric/Behavioral:  Negative for depression,  hallucinations and suicidal ideas. The patient is not nervous/anxious.    Blood pressure 112/72, pulse 78, temperature 98 F (36.7 C), temperature source Oral, resp. rate 18, last menstrual period 04/17/2024, SpO2 100%, unknown if currently breastfeeding. There is no height or weight on file to calculate BMI.  Demographic Factors:  Unemployed  Loss Factors: NA  Historical Factors: Impulsivity  Risk Reduction Factors:   NA  Continued Clinical Symptoms:  Alcohol/Substance Abuse/Dependencies  Cognitive Features That Contribute To Risk:  None    Suicide Risk:  Minimal: No identifiable suicidal ideation.  Patients presenting with no risk factors but with morbid ruminations; may be classified as minimal risk based on the severity of the depressive symptoms  Plan Of Care/Follow-up recommendations:  Activity: as tolerated  Diet: heart healthy  Other: -Follow-up with your outpatient psychiatric provider -instructions on appointment date, time, and address (location) are provided to you in discharge paperwork.  -Take your psychiatric medications as prescribed at discharge - instructions are provided to you in the discharge paperwork  -Recommend abstinence from alcohol, tobacco, and other illicit drug use at discharge.   -If your psychiatric symptoms recur, worsen, or if you have side effects to your psychiatric medications, call your outpatient psychiatric provider, 911, [phone] or go to the nearest emergency department.  -If suicidal thoughts recur, call your outpatient psychiatric provider, 911, [phone] or go to the nearest emergency department.   Disposition: FBC  Daking Westervelt, DO 04/28/2024, 6:55 AM

## 2024-04-28 NOTE — Group Note (Signed)
 Group Topic: Relapse and Recovery  Group Date: 04/28/2024 Start Time: 1430 End Time: 1500 Facilitators: Juanda Luba, Zane HERO, RN  Department: [facility]  Number of Participants: 1  Group Focus: nursing group Treatment Modality:  Individual Therapy Interventions utilized were patient education and support Purpose: express feelings and increase insight  Name: Tiffany [name] Date of Birth: [birth year]  MR: [id number]    Level of Participation: when cued Quality of Participation: cooperative and passive Interactions with others: gave feedback Mood/Affect: closed / guarded Triggers (if applicable): None identified at this time Cognition: coherent/clear Progress: Gaining insight Response: Patient guarded but answers questions appropriately. Patient oriented to unit, voices understanding of unit rules and layout. No questions at this time, voices understanding of who to reach out to in case future questions arise. Plan: patient will be encouraged to continue to attend groups/programming on the unit  Patients Problems:  Patient Active Problem List   Diagnosis Date Noted   Cocaine use disorder (HCC) 06/14/2023   Delta-9-tetrahydrocannabinol (THC) dependence (HCC) 06/14/2023   Alcohol use disorder 06/14/2023   Schizoaffective disorder, bipolar type (HCC) 06/13/2023   SAH (subarachnoid hemorrhage) (HCC) 06/03/2023   Acute pyelonephritis 08/17/2019   SIRS (systemic inflammatory response syndrome) (HCC) 08/17/2019   Hypokalemia 08/17/2019   Incomplete miscarriage 05/13/2012   Polysubstance abuse (HCC) 10/21/2011   Depression 10/21/2011

## 2024-04-28 NOTE — ED Notes (Signed)
 Patient transferred from [facility] to [facility] requesting assistance in detox from cocaine. Calm, cooperative throughout interview process. Skin assessment completed. Oriented to unit. Meal and drink offered. Patient alert & oriented x4. Denies intent to harm self or others when asked. Denies A/VH. Patient denies any physical complaints when asked. No acute distress noted. Support and encouragement provided. Routine safety checks conducted per facility protocol. Encouraged patient to notify staff if any thoughts of harm towards self or others arise. Patient verbalizes understanding and agreement.

## 2024-04-28 NOTE — ED Notes (Addendum)
 Pt sleeping short sintervals during night.  Toileted once through night and drinking fluids.  Calm and cooperative.  Denies thoughts of SI or self harm.Will continue to provide safety.

## 2024-04-28 NOTE — Group Note (Signed)
 Group Topic: Positive Affirmations  Group Date: 04/28/2024 Start Time: 1700 End Time: 1730 Facilitators: Elnor Keven SAILOR  Department: [facility]  Number of Participants: 7  Group Focus: affirmation Treatment Modality:  Psychoeducation Interventions utilized were support Purpose: reinforce self-care  Name: Tiffany [name] Date of Birth: 12/14/1985  MR: [id number]    Level of Participation: minimal Quality of Participation: attentive Interactions with others: positive Mood/Affect: appropriate Triggers (if applicable): NA Cognition: coherent/clear Progress: Moderate Response: Discussed what affirmations are and why they can be useful. Discussed the importance of positive self-concept and shared examples of positive self affirmations.  Plan: follow-up needed  Patients Problems:  Patient Active Problem List   Diagnosis Date Noted   Cocaine use disorder (HCC) 06/14/2023   Delta-9-tetrahydrocannabinol (THC) dependence (HCC) 06/14/2023   Alcohol use disorder 06/14/2023   Schizoaffective disorder, bipolar type (HCC) 06/13/2023   SAH (subarachnoid hemorrhage) (HCC) 06/03/2023   Acute pyelonephritis 08/17/2019   SIRS (systemic inflammatory response syndrome) (HCC) 08/17/2019   Hypokalemia 08/17/2019   Incomplete miscarriage 05/13/2012   Polysubstance abuse (HCC) 10/21/2011   Depression 10/21/2011

## 2024-04-28 NOTE — ED Notes (Signed)
 Pt appears to be resting in no acute distress. RR even and unlabored. Environment secured. Will continue to monitor for safety.SABRA

## 2024-04-29 DIAGNOSIS — F1410 Cocaine abuse, uncomplicated: Secondary | ICD-10-CM | POA: Diagnosis not present

## 2024-04-29 DIAGNOSIS — F1020 Alcohol dependence, uncomplicated: Secondary | ICD-10-CM | POA: Diagnosis not present

## 2024-04-29 DIAGNOSIS — F1994 Other psychoactive substance use, unspecified with psychoactive substance-induced mood disorder: Secondary | ICD-10-CM | POA: Diagnosis not present

## 2024-04-29 DIAGNOSIS — F250 Schizoaffective disorder, bipolar type: Secondary | ICD-10-CM | POA: Diagnosis not present

## 2024-04-29 MED ORDER — MIRTAZAPINE 7.5 MG PO TABS
7.5000 mg | ORAL_TABLET | Freq: Every day | ORAL | Status: DC
  Administered 2024-04-30 – 2024-05-02 (×4): 7.5 mg via ORAL
  Filled 2024-04-29 (×5): qty 1

## 2024-04-29 NOTE — Care Management (Deleted)
 FBC Care Management...  Addendum 3:04 pm  Patient reported that she would like to complete phone assessment with [facility]  Writer coordinated a phone assessment with Lamar at [employer] patient is under review  Clinical Research Associate assisted patient completed phone assessment with Debbie @ [facility]  Patient was not forthcoming with answers displaying unwillingness to complete screening.  Debbie discontinued screening, patient would not be a good fit for their program

## 2024-04-29 NOTE — Group Note (Signed)
 Group Topic: Recovery Basics  Group Date: 04/29/2024 Start Time: 2000 End Time: 2045 Facilitators: Nathanael Moulder, NT  Department: [facility]  Number of Participants: 8  Group Focus: relapse prevention, social skills, and substance abuse education Treatment Modality:  Psychoeducation and Skills Training Interventions utilized were exploration, reminiscence, story telling, and support Purpose: enhance coping skills, express feelings, relapse prevention strategies, and trigger / craving management  Name: Tiffany [name] Date of Birth: 03/09/1986  MR: [id number]    Level of Participation: active Quality of Participation: cooperative, engaged, and motivated Interactions with others: Appropriate Mood/Affect: appropriate Triggers (if applicable): None Cognition: coherent/clear Progress: Gaining insight Response: Appropriate Plan: patient will be encouraged to attend future groups  Patients Problems:  Patient Active Problem List   Diagnosis Date Noted   Cocaine use disorder (HCC) 06/14/2023   Delta-9-tetrahydrocannabinol (THC) dependence (HCC) 06/14/2023   Alcohol use disorder 06/14/2023   Schizoaffective disorder, bipolar type (HCC) 06/13/2023   SAH (subarachnoid hemorrhage) (HCC) 06/03/2023   Acute pyelonephritis 08/17/2019   SIRS (systemic inflammatory response syndrome) (HCC) 08/17/2019   Hypokalemia 08/17/2019   Incomplete miscarriage 05/13/2012   Polysubstance abuse (HCC) 10/21/2011   Depression 10/21/2011

## 2024-04-29 NOTE — Care Management (Signed)
 FBC Care Management...  Writer met with patient...  Writer assisted patient with phone assessment with Debbie at [facility].  Patient did not want to answer any questions. Patient was not forthcoming with answers.  Per [facility], patient would not suitable for their program.  Patient reported wanting to go to [facility] will follow-up with [facility]

## 2024-04-29 NOTE — ED Notes (Signed)
Patient given snack.  

## 2024-04-29 NOTE — ED Notes (Signed)
 Patient alert & oriented x4. Pt on unit throughout shift q15 checks completed. Pt denies intent to harm self or others. Denies AVH. No signs of acute distress noted. No inappropriate behaviors observed or reported.  Encouraged patient to notify staff if any thoughts of harm towards self or others arise. Patient verbalizes understanding and agreement. Eating and fluid intake were adequate. Medications administered as ordered refer to [facility]; no adverse effects noted.

## 2024-04-29 NOTE — ED Notes (Signed)
 Paitent provided lunch.

## 2024-04-29 NOTE — ED Notes (Signed)
 Patient is in the bedroom calm and sleeping.  NAD. Respirations even and unlabored. Will monitor for safety.

## 2024-04-29 NOTE — Group Note (Signed)
 Group Topic: Relapse and Recovery  Group Date: 04/29/2024 Start Time: 1330 End Time: 1400 Facilitators: Jhostin Epps, Zane HERO, RN; Reinhold, Comfort C, RN  Department: [facility]  Number of Participants: 1  Group Focus: nursing group Treatment Modality:  Individual Therapy Interventions utilized were patient education and support Purpose: increase insight  Name: Tiffany [name] Date of Birth: 12-Aug-1985  MR: [id number]    Level of Participation: minimal Quality of Participation: cooperative Interactions with others: gave feedback Mood/Affect: appropriate Triggers (if applicable): None identified at this time Cognition: coherent/clear Progress: Gaining insight Response: Patient voices no concerns voiced regarding unit stay. Patient voices understanding of who to speak with in case of future concerns.  Plan: patient will be encouraged to continue to attend groups/programming on the unit.  Patients Problems:  Patient Active Problem List   Diagnosis Date Noted   Cocaine use disorder (HCC) 06/14/2023   Delta-9-tetrahydrocannabinol (THC) dependence (HCC) 06/14/2023   Alcohol use disorder 06/14/2023   Schizoaffective disorder, bipolar type (HCC) 06/13/2023   SAH (subarachnoid hemorrhage) (HCC) 06/03/2023   Acute pyelonephritis 08/17/2019   SIRS (systemic inflammatory response syndrome) (HCC) 08/17/2019   Hypokalemia 08/17/2019   Incomplete miscarriage 05/13/2012   Polysubstance abuse (HCC) 10/21/2011   Depression 10/21/2011

## 2024-04-29 NOTE — Group Note (Signed)
 Group Topic: Balance in Life  Group Date: 04/29/2024 Start Time: 1400 End Time: 1430 Facilitators: Veverly Oddis BRAVO, NT  Department: [facility]  Number of Participants: 4  Group Focus: goals/reality orientation Treatment Modality:  Cognitive Behavioral Therapy Interventions utilized were assignment Purpose: regain self-worth  Name: KAMEE BOBST Date of Birth: [birth year]  MR: [id number]    Level of Participation: active Quality of Participation: engaged Interactions with others: gave feedback Mood/Affect: bright Triggers (if applicable): none Cognition: coherent/clear Progress: Moderate Response: One of my long term goals is to get my own place. Plan: patient will be encouraged to stay focused and keep attending group.  Patients Problems:  Patient Active Problem List   Diagnosis Date Noted   Cocaine use disorder (HCC) 06/14/2023   Delta-9-tetrahydrocannabinol (THC) dependence (HCC) 06/14/2023   Alcohol use disorder 06/14/2023   Schizoaffective disorder, bipolar type (HCC) 06/13/2023   SAH (subarachnoid hemorrhage) (HCC) 06/03/2023   Acute pyelonephritis 08/17/2019   SIRS (systemic inflammatory response syndrome) (HCC) 08/17/2019   Hypokalemia 08/17/2019   Incomplete miscarriage 05/13/2012   Polysubstance abuse (HCC) 10/21/2011   Depression 10/21/2011

## 2024-04-29 NOTE — ED Provider Notes (Addendum)
 Behavioral Health Progress Note  Date and Time: 04/29/2024 1:18 PM Name: Tiffany [name] MRN:  [id number]  Subjective:  Tiffany [name] is a 38 y.o. female who presented to [facility] as a walk in accompanied by  [facility] team voluntarily, after her crisis team was doing a follow-up related to patient's call a few days ago.  Patient endorsed suicidal ideations with a plan to drink bleach or walk out into traffic on day of admission to observation unit.   Patient was admitted to the [facility] unit yesterday, and continues to do well.  Patient reports good sleep and appetite, and denies any somatic complaints.  On assessment, patient is linear with good eye contact, euthymic mood and congruent affect.  Patient is resting in bed, plans to call [facility] today for screening examination.  Patient was encouraged to attend groups as able, and continues to deny SI/HI/AVH.  Diagnosis:  Final diagnoses:  Substance induced mood disorder (HCC)  Alcohol use disorder  Stimulant use disorder  Polysubstance abuse (HCC)   Total Time spent with patient: 15 minutes  Past Psychiatric History: Prior diagnoses of substance-induced mood disorder, chronically noncompliant with medications   Past Medical History: Denies  Family History: Denies  Social History:  [employer], smokes crack cocaine all day every day, and drinking a bottle of wine every other day   Sleep: Good  Appetite:  Fair  Current Medications:  Current Facility-Administered Medications  Medication Dose Route Frequency Provider Last Rate Last Admin   acetaminophen  (TYLENOL ) tablet 650 mg  650 mg Oral Q6H PRN Pocahontas Cohenour, DO       alum & mag hydroxide-simeth (MAALOX/MYLANTA) 200-200-20 MG/5ML suspension 30 mL  30 mL Oral Q4H PRN Azalynn Maxim, DO       haloperidol  (HALDOL ) tablet 5 mg  5 mg Oral TID PRN Yandriel Boening, DO       And   diphenhydrAMINE  (BENADRYL ) capsule 50 mg  50 mg Oral TID PRN Darcey Cardy, DO        haloperidol  lactate (HALDOL ) injection 10 mg  10 mg Intramuscular TID PRN Aven Christen, DO       And   diphenhydrAMINE  (BENADRYL ) injection 50 mg  50 mg Intramuscular TID PRN Kennidee Heyne, DO       And   LORazepam  (ATIVAN ) injection 2 mg  2 mg Intramuscular TID PRN Xayvion Shirah, DO       haloperidol  lactate (HALDOL ) injection 5 mg  5 mg Intramuscular TID PRN Bayard More, DO       And   diphenhydrAMINE  (BENADRYL ) injection 50 mg  50 mg Intramuscular TID PRN Mckaela Howley, DO       And   LORazepam  (ATIVAN ) injection 2 mg  2 mg Intramuscular TID PRN Elspeth Blucher, DO       hydrOXYzine  (ATARAX ) tablet 25 mg  25 mg Oral Q6H PRN Jessy Cybulski, DO       [START ON 04/30/2024] hydrOXYzine  (ATARAX ) tablet 25 mg  25 mg Oral TID PRN Makaylen Thieme, DO       loperamide  (IMODIUM ) capsule 2-4 mg  2-4 mg Oral PRN Dorinne Graeff, DO       LORazepam  (ATIVAN ) tablet 1 mg  1 mg Oral Q6H PRN Jadee Golebiewski, DO       magnesium  hydroxide (MILK OF MAGNESIA) suspension 30 mL  30 mL Oral Daily PRN Sharrie Self, DO       mirtazapine  (REMERON ) tablet 7.5 mg  7.5 mg Oral  QHS Manette Doto, DO       multivitamin with minerals tablet 1 tablet  1 tablet Oral Daily Jaiceon Collister, DO       ondansetron  (ZOFRAN -ODT) disintegrating tablet 4 mg  4 mg Oral Q6H PRN Temprance Wyre, DO       thiamine  (VITAMIN B1) tablet 100 mg  100 mg Oral Daily Jeslyn Amsler, DO       traZODone  (DESYREL ) tablet 100 mg  100 mg Oral QHS PRN Steffen Hase, DO   100 mg at 04/28/24 2134   No current outpatient medications on file.    Labs  Lab Results:  Admission on 04/27/2024, Discharged on 04/28/2024  Component Date Value Ref Range Status   POC Amphetamine UR 04/27/2024 None Detected  NONE DETECTED (Cut Off Level 1000 ng/mL) Final   POC Secobarbital (BAR) 04/27/2024 None Detected  NONE DETECTED (Cut Off Level 300 ng/mL) Final   POC Buprenorphine (BUP) 04/27/2024 None Detected  NONE DETECTED (Cut Off Level 10 ng/mL) Final   POC Oxazepam (BZO)  04/27/2024 None Detected  NONE DETECTED (Cut Off Level 300 ng/mL) Final   POC Cocaine UR 04/27/2024 Positive (A)  NONE DETECTED (Cut Off Level 300 ng/mL) Final   POC Methamphetamine UR 04/27/2024 None Detected  NONE DETECTED (Cut Off Level 1000 ng/mL) Final   POC Morphine  04/27/2024 None Detected  NONE DETECTED (Cut Off Level 300 ng/mL) Final   POC Methadone UR 04/27/2024 None Detected  NONE DETECTED (Cut Off Level 300 ng/mL) Final   POC Oxycodone  UR 04/27/2024 None Detected  NONE DETECTED (Cut Off Level 100 ng/mL) Final   POC Marijuana UR 04/27/2024 Positive (A)  NONE DETECTED (Cut Off Level 50 ng/mL) Final  Admission on 04/25/2024, Discharged on 04/26/2024  Component Date Value Ref Range Status   Sodium 04/25/2024 137  135 - 145 mmol/L Final   Potassium 04/25/2024 3.6  3.5 - 5.1 mmol/L Final   Chloride 04/25/2024 105  98 - 111 mmol/L Final   CO2 04/25/2024 22  22 - 32 mmol/L Final   Glucose, Bld 04/25/2024 97  70 - 99 mg/dL Final   Glucose reference range applies only to samples taken after fasting for at least 8 hours.   BUN 04/25/2024 <5 (L)  6 - 20 mg/dL Final   Creatinine, Ser 04/25/2024 0.71  0.44 - 1.00 mg/dL Final   Calcium 88/77/7974 8.4 (L)  8.9 - 10.3 mg/dL Final   Total Protein 88/77/7974 6.5  6.5 - 8.1 g/dL Final   Albumin 88/77/7974 3.7  3.5 - 5.0 g/dL Final   AST 88/77/7974 25  15 - 41 U/L Final   ALT 04/25/2024 13  0 - 44 U/L Final   Alkaline Phosphatase 04/25/2024 51  38 - 126 U/L Final   Total Bilirubin 04/25/2024 0.3  0.0 - 1.2 mg/dL Final   GFR, Estimated 04/25/2024 >60  >60 mL/min Final   Comment: (NOTE) Calculated using the CKD-EPI Creatinine Equation (2021)    Anion gap 04/25/2024 10  5 - 15 Final   Performed at [facility] Lab, [address], [place], [place] [address]   Alcohol, Ethyl (B) 04/25/2024 <15  <15 mg/dL Final   Comment: (NOTE) For medical purposes only. Performed at [facility] Lab, [address] [place], [place] [address]    WBC  04/25/2024 4.1  4.0 - 10.5 K/uL Final   RBC 04/25/2024 3.67 (L)  3.87 - 5.11 MIL/uL Final   Hemoglobin 04/25/2024 10.3 (L)  12.0 - 15.0 g/dL Final  HCT 04/25/2024 31.8 (L)  36.0 - 46.0 % Final   MCV 04/25/2024 86.6  80.0 - 100.0 fL Final   MCH 04/25/2024 28.1  26.0 - 34.0 pg Final   MCHC 04/25/2024 32.4  30.0 - 36.0 g/dL Final   RDW 88/77/7974 17.3 (H)  11.5 - 15.5 % Final   Platelets 04/25/2024 418 (H)  150 - 400 K/uL Final   nRBC 04/25/2024 0.0  0.0 - 0.2 % Final   Performed at [facility] Lab, [address] [place], [place] [address]   Opiates 04/25/2024 NONE DETECTED  NONE DETECTED Final   Cocaine 04/25/2024 POSITIVE (A)  NONE DETECTED Final   Benzodiazepines 04/25/2024 NONE DETECTED  NONE DETECTED Final   Amphetamines 04/25/2024 NONE DETECTED  NONE DETECTED Final   Tetrahydrocannabinol 04/25/2024 POSITIVE (A)  NONE DETECTED Final   Barbiturates 04/25/2024 NONE DETECTED  NONE DETECTED Final   Comment: (NOTE) DRUG SCREEN FOR MEDICAL PURPOSES ONLY.  IF CONFIRMATION IS NEEDED FOR ANY PURPOSE, NOTIFY LAB WITHIN 5 DAYS.  LOWEST DETECTABLE LIMITS FOR URINE DRUG SCREEN Drug Class                     Cutoff (ng/mL) Amphetamine and metabolites    1000 Barbiturate and metabolites    200 Benzodiazepine                 200 Opiates and metabolites        300 Cocaine and metabolites        300 THC                            50 Performed at [facility] Lab, [address] [place], [place] [address]    Preg, Serum 04/25/2024 NEGATIVE  NEGATIVE Final   Comment:        THE SENSITIVITY OF THIS METHODOLOGY IS >10 mIU/mL. Performed at [facility] Lab, [address], [place], [place] [address]    Color, Urine 04/25/2024 YELLOW  YELLOW Final   APPearance 04/25/2024 CLEAR  CLEAR Final   Specific Gravity, Urine 04/25/2024 1.008  1.005 - 1.030 Final   pH 04/25/2024 7.0  5.0 - 8.0 Final   Glucose, UA 04/25/2024 NEGATIVE  NEGATIVE mg/dL Final   Hgb urine dipstick 04/25/2024  NEGATIVE  NEGATIVE Final   Bilirubin Urine 04/25/2024 NEGATIVE  NEGATIVE Final   Ketones, ur 04/25/2024 NEGATIVE  NEGATIVE mg/dL Final   Protein, ur 88/77/7974 NEGATIVE  NEGATIVE mg/dL Final   Nitrite 88/77/7974 NEGATIVE  NEGATIVE Final   Leukocytes,Ua 04/25/2024 NEGATIVE  NEGATIVE Final   Performed at [facility], [address] [place], [place] [address]   SARS Coronavirus 2 by RT PCR 04/25/2024 NEGATIVE  NEGATIVE Final   Performed at [facility], [address], [place], [place] [address]  Hospital Outpatient Visit on 01/14/2024  Component Date Value Ref Range Status   Color, Urine 01/14/2024 YELLOW  YELLOW Final   APPearance 01/14/2024 CLEAR  CLEAR Final   Specific Gravity, Urine 01/14/2024 1.017  1.005 - 1.030 Final   pH 01/14/2024 6.0  5.0 - 8.0 Final   Glucose, UA 01/14/2024 NEGATIVE  NEGATIVE mg/dL Final   Hgb urine dipstick 01/14/2024 NEGATIVE  NEGATIVE Final   Bilirubin Urine 01/14/2024 NEGATIVE  NEGATIVE Final   Ketones, ur 01/14/2024 NEGATIVE  NEGATIVE mg/dL Final   Protein, ur 91/87/7974 NEGATIVE  NEGATIVE mg/dL Final   Nitrite 91/87/7974 NEGATIVE  NEGATIVE Final   Leukocytes,Ua 01/14/2024 NEGATIVE  NEGATIVE Final   Performed at [facility] Lab, [address] [place], [place] [address]   Sodium 01/14/2024 136  135 - 145 mmol/L Final   Potassium 01/14/2024 3.4 (L)  3.5 - 5.1 mmol/L Final   HEMOLYSIS AT THIS LEVEL MAY AFFECT RESULT   Chloride 01/14/2024 102  98 - 111 mmol/L Final   CO2 01/14/2024 23  22 - 32 mmol/L Final   Glucose, Bld 01/14/2024 96  70 - 99 mg/dL Final   Glucose reference range applies only to samples taken after fasting for at least 8 hours.   BUN 01/14/2024 7  6 - 20 mg/dL Final   Creatinine, Ser 01/14/2024 0.70  0.44 - 1.00 mg/dL Final   Calcium 91/87/7974 8.9  8.9 - 10.3 mg/dL Final   GFR, Estimated 01/14/2024 >60  >60 mL/min Final   Comment: (NOTE) Calculated using the CKD-EPI Creatinine Equation (2021)    Anion gap 01/14/2024  11  5 - 15 Final   Performed at [facility] Lab, [address], [place], [place] [address]   WBC 01/14/2024 6.3  4.0 - 10.5 K/uL Final   RBC 01/14/2024 4.45  3.87 - 5.11 MIL/uL Final   Hemoglobin 01/14/2024 12.7  12.0 - 15.0 g/dL Final   HCT 91/87/7974 39.7  36.0 - 46.0 % Final   MCV 01/14/2024 89.2  80.0 - 100.0 fL Final   MCH 01/14/2024 28.5  26.0 - 34.0 pg Final   MCHC 01/14/2024 32.0  30.0 - 36.0 g/dL Final   RDW 91/87/7974 18.0 (H)  11.5 - 15.5 % Final   Platelets 01/14/2024 444 (H)  150 - 400 K/uL Final   nRBC 01/14/2024 0.0  0.0 - 0.2 % Final   Neutrophils Relative % 01/14/2024 49  % Final   Neutro Abs 01/14/2024 3.1  1.7 - 7.7 K/uL Final   Lymphocytes Relative 01/14/2024 37  % Final   Lymphs Abs 01/14/2024 2.4  0.7 - 4.0 K/uL Final   Monocytes Relative 01/14/2024 10  % Final   Monocytes Absolute 01/14/2024 0.6  0.1 - 1.0 K/uL Final   Eosinophils Relative 01/14/2024 3  % Final   Eosinophils Absolute 01/14/2024 0.2  0.0 - 0.5 K/uL Final   Basophils Relative 01/14/2024 1  % Final   Basophils Absolute 01/14/2024 0.1  0.0 - 0.1 K/uL Final   Immature Granulocytes 01/14/2024 0  % Final   Abs Immature Granulocytes 01/14/2024 0.01  0.00 - 0.07 K/uL Final   Performed at [facility] Lab, [address] [place], [place] [address]   Prothrombin Time 01/14/2024 13.5  11.4 - 15.2 seconds Final   INR 01/14/2024 1.0  0.8 - 1.2 Final   Comment: (NOTE) INR goal varies based on device and disease states. Performed at [facility], [address], [place], [place] [address]    Preg Test, Ur 01/14/2024 NEGATIVE  NEGATIVE Final   Comment:        THE SENSITIVITY OF THIS METHODOLOGY IS >20 mIU/mL. Performed at [facility] Lab, [address], [place], [place] [address]     Blood Alcohol level:  Lab Results  Component Value Date   [facility] <15 04/25/2024   ETH <10 06/03/2023    Metabolic Disorder Labs: Lab Results  Component Value Date   HGBA1C 5.6 06/08/2023   MPG 114  06/08/2023   No results found for: PROLACTIN Lab Results  Component Value Date   CHOL 188  06/15/2023   TRIG 127 06/15/2023   HDL 72 06/15/2023   CHOLHDL 2.6 06/15/2023   VLDL 25 06/15/2023   LDLCALC 91 06/15/2023    Therapeutic Lab Levels: No results found for: LITHIUM No results found for: VALPROATE No results found for: CBMZ  Physical Findings   AIMS    Flowsheet Row [facility]  AIMS Total Score 0   AUDIT    Flowsheet Row Admission (Discharged[facility]  Alcohol Use Disorder Identification Test Final Score (AUDIT) 6   Flowsheet Row ED from 04/28/2024 in [facility] ED from 04/27/2024 in [facility] ED from 04/25/2024 in [facility]  C-SSRS RISK CATEGORY No Risk Low Risk No Risk     Musculoskeletal  Strength & Muscle Tone: within normal limits Gait & Station: normal Patient leans: N/A  Psychiatric Specialty Exam  Presentation  General Appearance:  Disheveled  Eye Contact: Good  Speech: Normal Rate  Speech Volume: Normal  Handedness: Right   Mood and Affect  Mood: Irritable  Affect: Congruent   Thought Process  Thought Processes: Goal Directed; Coherent  Descriptions of Associations:Intact  Orientation:Full (Time, Place and Person)  Thought Content:Logical  Diagnosis of Schizophrenia or Schizoaffective disorder in past: Yes    Hallucinations:Hallucinations: None  Ideas of Reference:None  Suicidal Thoughts:Suicidal Thoughts: No  Homicidal Thoughts:Homicidal Thoughts: No   Sensorium  Memory: Immediate Fair  Judgment: Poor  Insight: Poor   Executive Functions  Concentration: Fair  Attention Span: Fair  Recall: Fair  Fund of Knowledge: Fair  Language: Fair   Psychomotor  Activity  Psychomotor Activity: Psychomotor Activity: Normal   Assets  Assets: Communication Skills; Desire for Improvement; Physical Health; Resilience   Sleep  Sleep: Sleep: Fair  Estimated Sleeping Duration (Last 24 Hours): 15.25-18.25 hours  No data recorded  Physical Exam  Physical Exam Constitutional:      General: She is not in acute distress.    Appearance: She is not ill-appearing, toxic-appearing or diaphoretic.  Pulmonary:     Effort: Pulmonary effort is normal.    Review of Systems  Constitutional:  Negative for diaphoresis.  Gastrointestinal:  Negative for abdominal pain.  Neurological:  Negative for dizziness and headaches.  Psychiatric/Behavioral:  Negative for hallucinations and suicidal ideas.    Blood pressure 102/70, pulse 60, temperature 98.2 F (36.8 C), temperature source Oral, resp. rate 17, last menstrual period 04/17/2024, SpO2 100%, unknown if currently breastfeeding. There is no height or weight on file to calculate BMI.  Treatment Plan Summary: Daily contact with patient to assess and evaluate symptoms and progress in treatment  Tiffany [name] is a 38 y.o. female who presented to [facility] as a walk in accompanied by [facility] team voluntarily, after her crisis team was doing a follow-up related to patient's call a few days ago.  Patient initially presented requesting detoxification from cocaine and alcohol, with motivation to pursue residential rehabilitation.  Patient was admitted to the [facility] unit yesterday where she continues to make plans for residential rehabilitation, while monitoring her substance withdrawal symptoms.   ## AUD ## Stimulant use disorder ## Substance-induced mood disorder -CIWA protocol -Thiamine  100 mg daily for AUD - Continue trazodone  100 mg nightly for sleep - Start Remeron  7.5 mg nightly for sleep and mood - Discontinue Zyprexa  5 mg at bedtime, patient was started on Zyprexa  by admitting  practitioner for unclear reason  and not currently psychotic  Alfornia Light, DO 04/29/2024 1:18 PM

## 2024-04-29 NOTE — Care Management (Addendum)
 [facility] Care Management...  Addendum 3:04 pm  Patient reported that she would like to complete phone assessment with [facility]   Writer coordinated a phone assessment with Lamar at [employer] patient is under review @ [employer] assisted patient completed phone assessment with Debbie @ [facility]  Patient currently under review at [employer] assisted patient in completing phone assessment with [facility]  Per Debbie at [employer].. Patient was not forthcoming with answers and not willing to complete assessment  Debbie ended the assessment and discussed with Writer not being appropriate for their Producer, Television/film/video received call from [employer] SW with Adult Corrections and Careers Adviser received verbal consent and ROI signed to speak with Theatre Stage Manager ([phone])

## 2024-04-29 NOTE — ED Notes (Signed)
 Patient is in the bedroom calm and sleeping. NAD. Will continue to monitor for safety.

## 2024-04-29 NOTE — ED Notes (Signed)
 Paitent provided dinner.

## 2024-04-29 NOTE — ED Notes (Signed)
 Paitent provided breakfast.

## 2024-04-29 NOTE — ED Notes (Signed)
 Patient alert & oriented x4. Denies intent to harm self or others when asked. Denies A/VH. Patient denies any physical complaints when asked. No acute distress noted. Patient refused all scheduled medications, per night shift report patient had refused scheduled medications then as well. Support and encouragement provided. Patient observed in milieu. No inappropriate behaviors observed or reported. Routine safety checks conducted per facility protocol. Encouraged patient to notify staff if any thoughts of harm towards self or others arise. Patient verbalizes understanding and agreement.

## 2024-04-30 DIAGNOSIS — F1994 Other psychoactive substance use, unspecified with psychoactive substance-induced mood disorder: Secondary | ICD-10-CM | POA: Diagnosis not present

## 2024-04-30 DIAGNOSIS — F250 Schizoaffective disorder, bipolar type: Secondary | ICD-10-CM | POA: Diagnosis not present

## 2024-04-30 DIAGNOSIS — F1020 Alcohol dependence, uncomplicated: Secondary | ICD-10-CM | POA: Diagnosis not present

## 2024-04-30 DIAGNOSIS — F1410 Cocaine abuse, uncomplicated: Secondary | ICD-10-CM | POA: Diagnosis not present

## 2024-04-30 NOTE — ED Notes (Signed)
 Patient is sleeping in the bedroom, NAD Will continue to monitor for safety.

## 2024-04-30 NOTE — Care Management (Addendum)
 [facility] Care Management...  Writer spoke with [place] @ [facility]...  Per Merilee, patient has been accepted to their residential program  Patient can discharge Monday 05/04/2024   [facility] will arrange transportation  Writer requested pick-up by 10:00 am   [facility] [address] [place] [address]  7 day supply and Scripts

## 2024-04-30 NOTE — Group Note (Signed)
 Group Topic: Communication  Group Date: 04/30/2024 Start Time: 1200 End Time: 1230 Facilitators: Reinhold Minor BROCKS, RN  Department: [facility]  Number of Participants: 7  Group Focus: communication Treatment Modality:  Psychoeducation Interventions utilized were patient education Purpose: improve communication skills  Name: Tiffany [name] Date of Birth: 05-22-1986  MR: [id number]    Level of Participation: minimal Quality of Participation: attentive Interactions with others: gave feedback Mood/Affect: appropriate Triggers (if applicable): na Cognition: insightful Progress: Gaining insight Response: will learn to voice my needs appropriately Plan: patient encouraged to communicate with staff and peers  Patients Problems:  Patient Active Problem List   Diagnosis Date Noted   Cocaine use disorder (HCC) 06/14/2023   Delta-9-tetrahydrocannabinol (THC) dependence (HCC) 06/14/2023   Alcohol use disorder 06/14/2023   Schizoaffective disorder, bipolar type (HCC) 06/13/2023   SAH (subarachnoid hemorrhage) (HCC) 06/03/2023   Acute pyelonephritis 08/17/2019   SIRS (systemic inflammatory response syndrome) (HCC) 08/17/2019   Hypokalemia 08/17/2019   Incomplete miscarriage 05/13/2012   Polysubstance abuse (HCC) 10/21/2011   Depression 10/21/2011

## 2024-04-30 NOTE — ED Notes (Signed)
 Paitent provided lunch.

## 2024-04-30 NOTE — ED Notes (Signed)
 Pt sitting in dayroom watching TV with peers, no acute distress noted. Continue to monitor for safety.

## 2024-04-30 NOTE — ED Notes (Signed)
 Paitent provided dinner.

## 2024-04-30 NOTE — Discharge Instructions (Addendum)
 [facility] Care Management...  Writer spoke with [place] @ [facility]...  Per Merilee, patient has been accepted to their residential program  Patient can discharge Monday 05/04/2024 by 11 am  [facility] will arrange transportation  Writer requested pick-up by 10:00 am  [facility] [address] [place] [address]  7 day supply and Scripts

## 2024-04-30 NOTE — ED Notes (Signed)
 Patient got upset this morning because she wanted cereal and oatmeal, the witness explained to her that supplies were low and patient can have either one or the other, but not both, she got mad started complaining she had that yesterday and last night? She wanted to speak with someone else, the witness informed the nurse who explained the same thing to her, she was still mad.

## 2024-04-30 NOTE — ED Notes (Signed)
 Paitent provided breakfast.

## 2024-04-30 NOTE — ED Provider Notes (Addendum)
 Behavioral Health Progress Note  Date and Time: 04/30/2024 10:57 AM Name: Tiffany [name] MRN:  [id number]  Patient is a  38y.o. female who presented to [facility] as a walk in accompanied by [facility] team voluntarily, after her crisis team was doing a follow-up related to patient's call a few days ago. Patient endorsed suicidal ideations with a plan to drink bleach or walk out into traffic on day of admission to observation unit.    Interval History Patient was seen today for re-evaluation.  Nursing reports no events overnight. The patient has no issues with performing ADLs.  Patient has been medication compliant.    Patient was seen and interviewed by attending psychiatrist. Chart reviewed. Patient discussed during treatment team rounds.  Subjective:  On assessment patient reports feeling okay. Reports she took the combination of Remeron  and Trazodone  last night and slept better. She denies any complaints, mental or physical today. Reports feeling irritable due to not getting the cereal she wanted during breakfast. Reports feeling less depressed, less anxious. She denies auditory or visual hallucinations, denies feeling paranoid, unsafe, does not express any delusions. She denies thoughts or plans of hurting self or others. He reports no side effects from medications. She spoke with a rehab program yesterday and had not seen a SW for any results yet.    Labs: no new results for review.    Diagnosis:  Final diagnoses:  Substance induced mood disorder (HCC)  Alcohol use disorder  Stimulant use disorder  Polysubstance abuse (HCC)    Total Time spent with patient: 30 minutes  Past Psychiatric History: see H&P Past Medical History: see H&P Family History: see H&P Social History: see H&P   Sleep: Fair  Appetite:  Fair  Current Medications:  Current Facility-Administered Medications  Medication Dose Route Frequency Provider Last Rate Last Admin   acetaminophen  (TYLENOL ) tablet 650  mg  650 mg Oral Q6H PRN Faunce, Alina, DO       alum & mag hydroxide-simeth (MAALOX/MYLANTA) 200-200-20 MG/5ML suspension 30 mL  30 mL Oral Q4H PRN Faunce, Alina, DO       haloperidol  (HALDOL ) tablet 5 mg  5 mg Oral TID PRN Faunce, Alina, DO       And   diphenhydrAMINE  (BENADRYL ) capsule 50 mg  50 mg Oral TID PRN Faunce, Alina, DO       haloperidol  lactate (HALDOL ) injection 10 mg  10 mg Intramuscular TID PRN Faunce, Alina, DO       And   diphenhydrAMINE  (BENADRYL ) injection 50 mg  50 mg Intramuscular TID PRN Faunce, Alina, DO       And   LORazepam  (ATIVAN ) injection 2 mg  2 mg Intramuscular TID PRN Faunce, Alina, DO       haloperidol  lactate (HALDOL ) injection 5 mg  5 mg Intramuscular TID PRN Faunce, Alina, DO       And   diphenhydrAMINE  (BENADRYL ) injection 50 mg  50 mg Intramuscular TID PRN Faunce, Alina, DO       And   LORazepam  (ATIVAN ) injection 2 mg  2 mg Intramuscular TID PRN Faunce, Alina, DO       hydrOXYzine  (ATARAX ) tablet 25 mg  25 mg Oral Q6H PRN Faunce, Alina, DO       hydrOXYzine  (ATARAX ) tablet 25 mg  25 mg Oral TID PRN Faunce, Alina, DO       loperamide  (IMODIUM ) capsule 2-4 mg  2-4 mg Oral PRN Faunce, Alina, DO       LORazepam  (ATIVAN )  tablet 1 mg  1 mg Oral Q6H PRN Faunce, Alina, DO       magnesium  hydroxide (MILK OF MAGNESIA) suspension 30 mL  30 mL Oral Daily PRN Faunce, Alina, DO       mirtazapine  (REMERON ) tablet 7.5 mg  7.5 mg Oral QHS Faunce, Alina, DO   7.5 mg at 04/30/24 0157   multivitamin with minerals tablet 1 tablet  1 tablet Oral Daily Faunce, Alina, DO       ondansetron  (ZOFRAN -ODT) disintegrating tablet 4 mg  4 mg Oral Q6H PRN Faunce, Alina, DO       thiamine  (VITAMIN B1) tablet 100 mg  100 mg Oral Daily Faunce, Alina, DO       traZODone  (DESYREL ) tablet 100 mg  100 mg Oral QHS PRN Faunce, Alina, DO   100 mg at 04/30/24 0157   No current outpatient medications on file.    Labs  Lab Results:  Admission on 04/27/2024, Discharged on 04/28/2024   Component Date Value Ref Range Status   POC Amphetamine UR 04/27/2024 None Detected  NONE DETECTED (Cut Off Level 1000 ng/mL) Final   POC Secobarbital (BAR) 04/27/2024 None Detected  NONE DETECTED (Cut Off Level 300 ng/mL) Final   POC Buprenorphine (BUP) 04/27/2024 None Detected  NONE DETECTED (Cut Off Level 10 ng/mL) Final   POC Oxazepam (BZO) 04/27/2024 None Detected  NONE DETECTED (Cut Off Level 300 ng/mL) Final   POC Cocaine UR 04/27/2024 Positive (A)  NONE DETECTED (Cut Off Level 300 ng/mL) Final   POC Methamphetamine UR 04/27/2024 None Detected  NONE DETECTED (Cut Off Level 1000 ng/mL) Final   POC Morphine  04/27/2024 None Detected  NONE DETECTED (Cut Off Level 300 ng/mL) Final   POC Methadone UR 04/27/2024 None Detected  NONE DETECTED (Cut Off Level 300 ng/mL) Final   POC Oxycodone  UR 04/27/2024 None Detected  NONE DETECTED (Cut Off Level 100 ng/mL) Final   POC Marijuana UR 04/27/2024 Positive (A)  NONE DETECTED (Cut Off Level 50 ng/mL) Final  Admission on 04/25/2024, Discharged on 04/26/2024  Component Date Value Ref Range Status   Sodium 04/25/2024 137  135 - 145 mmol/L Final   Potassium 04/25/2024 3.6  3.5 - 5.1 mmol/L Final   Chloride 04/25/2024 105  98 - 111 mmol/L Final   CO2 04/25/2024 22  22 - 32 mmol/L Final   Glucose, Bld 04/25/2024 97  70 - 99 mg/dL Final   Glucose reference range applies only to samples taken after fasting for at least 8 hours.   BUN 04/25/2024 <5 (L)  6 - 20 mg/dL Final   Creatinine, Ser 04/25/2024 0.71  0.44 - 1.00 mg/dL Final   Calcium 88/77/7974 8.4 (L)  8.9 - 10.3 mg/dL Final   Total Protein 88/77/7974 6.5  6.5 - 8.1 g/dL Final   Albumin 88/77/7974 3.7  3.5 - 5.0 g/dL Final   AST 88/77/7974 25  15 - 41 U/L Final   ALT 04/25/2024 13  0 - 44 U/L Final   Alkaline Phosphatase 04/25/2024 51  38 - 126 U/L Final   Total Bilirubin 04/25/2024 0.3  0.0 - 1.2 mg/dL Final   GFR, Estimated 04/25/2024 >60  >60 mL/min Final   Comment: (NOTE) Calculated using  the CKD-EPI Creatinine Equation (2021)    Anion gap 04/25/2024 10  5 - 15 Final   Performed at [facility], [address], [place], [place] [address]   Alcohol, Ethyl (B) 04/25/2024 <15  <15 mg/dL Final   Comment: (NOTE)  For medical purposes only. Performed at [facility], [address], [place], [place] [address]    WBC 04/25/2024 4.1  4.0 - 10.5 K/uL Final   RBC 04/25/2024 3.67 (L)  3.87 - 5.11 MIL/uL Final   Hemoglobin 04/25/2024 10.3 (L)  12.0 - 15.0 g/dL Final   HCT 88/77/7974 31.8 (L)  36.0 - 46.0 % Final   MCV 04/25/2024 86.6  80.0 - 100.0 fL Final   MCH 04/25/2024 28.1  26.0 - 34.0 pg Final   MCHC 04/25/2024 32.4  30.0 - 36.0 g/dL Final   RDW 88/77/7974 17.3 (H)  11.5 - 15.5 % Final   Platelets 04/25/2024 418 (H)  150 - 400 K/uL Final   nRBC 04/25/2024 0.0  0.0 - 0.2 % Final   Performed at [facility], [address], [place], [place] [address]   Opiates 04/25/2024 NONE DETECTED  NONE DETECTED Final   Cocaine 04/25/2024 POSITIVE (A)  NONE DETECTED Final   Benzodiazepines 04/25/2024 NONE DETECTED  NONE DETECTED Final   Amphetamines 04/25/2024 NONE DETECTED  NONE DETECTED Final   Tetrahydrocannabinol 04/25/2024 POSITIVE (A)  NONE DETECTED Final   Barbiturates 04/25/2024 NONE DETECTED  NONE DETECTED Final   Comment: (NOTE) DRUG SCREEN FOR MEDICAL PURPOSES ONLY.  IF CONFIRMATION IS NEEDED FOR ANY PURPOSE, NOTIFY LAB WITHIN 5 DAYS.  LOWEST DETECTABLE LIMITS FOR URINE DRUG SCREEN Drug Class                     Cutoff (ng/mL) Amphetamine and metabolites    1000 Barbiturate and metabolites    200 Benzodiazepine                 200 Opiates and metabolites        300 Cocaine and metabolites        300 THC                            50 Performed at [facility], [address], [place], [place] [address]    Preg, Serum 04/25/2024 NEGATIVE  NEGATIVE Final   Comment:        THE SENSITIVITY OF THIS METHODOLOGY IS >10 mIU/mL. Performed at [facility], [address] [place], [place] [address]    Color, Urine 04/25/2024 YELLOW  YELLOW Final   APPearance 04/25/2024 CLEAR  CLEAR Final   Specific Gravity, Urine 04/25/2024 1.008  1.005 - 1.030 Final   pH 04/25/2024 7.0  5.0 - 8.0 Final   Glucose, UA 04/25/2024 NEGATIVE  NEGATIVE mg/dL Final   Hgb urine dipstick 04/25/2024 NEGATIVE  NEGATIVE Final   Bilirubin Urine 04/25/2024 NEGATIVE  NEGATIVE Final   Ketones, ur 04/25/2024 NEGATIVE  NEGATIVE mg/dL Final   Protein, ur 88/77/7974 NEGATIVE  NEGATIVE mg/dL Final   Nitrite 88/77/7974 NEGATIVE  NEGATIVE Final   Leukocytes,Ua 04/25/2024 NEGATIVE  NEGATIVE Final   Performed at [facility], [address], [place], [place] [address]   SARS Coronavirus 2 by RT PCR 04/25/2024 NEGATIVE  NEGATIVE Final   Performed at [facility], [address] [place], [place] [address]  Hospital Outpatient Visit on 01/14/2024  Component Date Value Ref Range Status   Color, Urine 01/14/2024 YELLOW  YELLOW Final   APPearance 01/14/2024 CLEAR  CLEAR Final   Specific Gravity, Urine 01/14/2024 1.017  1.005 - 1.030 Final   pH 01/14/2024 6.0  5.0 - 8.0  Final   Glucose, UA 01/14/2024 NEGATIVE  NEGATIVE mg/dL Final   Hgb urine dipstick 01/14/2024 NEGATIVE  NEGATIVE Final   Bilirubin Urine 01/14/2024 NEGATIVE  NEGATIVE Final   Ketones, ur 01/14/2024 NEGATIVE  NEGATIVE mg/dL Final   Protein, ur 91/87/7974 NEGATIVE  NEGATIVE mg/dL Final   Nitrite 91/87/7974 NEGATIVE  NEGATIVE Final   Leukocytes,Ua 01/14/2024 NEGATIVE  NEGATIVE Final   Performed at [facility], [address] [place], [place] [address]   Sodium 01/14/2024 136  135 - 145 mmol/L Final   Potassium 01/14/2024 3.4 (L)  3.5 - 5.1 mmol/L Final   HEMOLYSIS AT THIS LEVEL MAY AFFECT RESULT   Chloride 01/14/2024 102  98 - 111 mmol/L Final   CO2 01/14/2024 23  22 - 32 mmol/L Final   Glucose, Bld 01/14/2024 96  70 - 99 mg/dL Final   Glucose reference range applies only to samples  taken after fasting for at least 8 hours.   BUN 01/14/2024 7  6 - 20 mg/dL Final   Creatinine, Ser 01/14/2024 0.70  0.44 - 1.00 mg/dL Final   Calcium 91/87/7974 8.9  8.9 - 10.3 mg/dL Final   GFR, Estimated 01/14/2024 >60  >60 mL/min Final   Comment: (NOTE) Calculated using the CKD-EPI Creatinine Equation (2021)    Anion gap 01/14/2024 11  5 - 15 Final   Performed at [facility], [address] [place], [place] [address]   WBC 01/14/2024 6.3  4.0 - 10.5 K/uL Final   RBC 01/14/2024 4.45  3.87 - 5.11 MIL/uL Final   Hemoglobin 01/14/2024 12.7  12.0 - 15.0 g/dL Final   HCT 91/87/7974 39.7  36.0 - 46.0 % Final   MCV 01/14/2024 89.2  80.0 - 100.0 fL Final   MCH 01/14/2024 28.5  26.0 - 34.0 pg Final   MCHC 01/14/2024 32.0  30.0 - 36.0 g/dL Final   RDW 91/87/7974 18.0 (H)  11.5 - 15.5 % Final   Platelets 01/14/2024 444 (H)  150 - 400 K/uL Final   nRBC 01/14/2024 0.0  0.0 - 0.2 % Final   Neutrophils Relative % 01/14/2024 49  % Final   Neutro Abs 01/14/2024 3.1  1.7 - 7.7 K/uL Final   Lymphocytes Relative 01/14/2024 37  % Final   Lymphs Abs 01/14/2024 2.4  0.7 - 4.0 K/uL Final   Monocytes Relative 01/14/2024 10  % Final   Monocytes Absolute 01/14/2024 0.6  0.1 - 1.0 K/uL Final   Eosinophils Relative 01/14/2024 3  % Final   Eosinophils Absolute 01/14/2024 0.2  0.0 - 0.5 K/uL Final   Basophils Relative 01/14/2024 1  % Final   Basophils Absolute 01/14/2024 0.1  0.0 - 0.1 K/uL Final   Immature Granulocytes 01/14/2024 0  % Final   Abs Immature Granulocytes 01/14/2024 0.01  0.00 - 0.07 K/uL Final   Performed at [facility], [address], [place], [place] [address]   Prothrombin Time 01/14/2024 13.5  11.4 - 15.2 seconds Final   INR 01/14/2024 1.0  0.8 - 1.2 Final   Comment: (NOTE) INR goal varies based on device and disease states. Performed at [facility], [address] [place], [place] [address]    Preg Test, Ur 01/14/2024 NEGATIVE  NEGATIVE Final   Comment:         THE SENSITIVITY OF THIS METHODOLOGY IS >20 mIU/mL. Performed at [facility], [address] [place], [place] [address]     Blood Alcohol level:  Lab Results  Component Value Date   [facility] <15 04/25/2024   ETH <10 06/03/2023    Metabolic Disorder Labs: Lab Results  Component Value Date   HGBA1C 5.6 06/08/2023   MPG 114 06/08/2023   No results found for: PROLACTIN Lab Results  Component Value Date   CHOL 188 06/15/2023   TRIG 127 06/15/2023   HDL 72 06/15/2023   CHOLHDL 2.6 06/15/2023   VLDL 25 06/15/2023   LDLCALC 91 06/15/2023    Therapeutic Lab Levels: No results found for: LITHIUM No results found for: VALPROATE No results found for: CBMZ  Physical Findings   AIMS    Flowsheet Row [facility]  AIMS Total Score 0   AUDIT    Flowsheet Row Ad[facility] ([facility]  Alcohol Use Disorder Identification Test Final Score (AUDIT) 6   Flowsheet Row ED from 04/28/2024 in [facility] ED from 04/27/2024 in [facility] ED from 04/25/2024 in [facility] at [facility]  C-SSRS RISK CATEGORY No Risk Low Risk No Risk     Musculoskeletal  Strength & Muscle Tone: within normal limits Gait & Station: normal Patient leans: N/A  Psychiatric Specialty Exam  Presentation  General Appearance:  Disheveled  Eye Contact: Good  Speech: Normal Rate  Speech Volume: Normal  Handedness: Right   Mood and Affect  Mood: Irritable  Affect: Congruent   Thought Process  Thought Processes: Goal Directed; Coherent  Descriptions of Associations:Intact  Orientation:Full (Time, Place and Person)  Thought Content:Logical  Diagnosis of Schizophrenia or Schizoaffective disorder in past: Yes    Hallucinations:No data recorded Ideas of  Reference:None  Suicidal Thoughts:No data recorded Homicidal Thoughts:No data recorded  Sensorium  Memory: Immediate Fair  Judgment: Poor  Insight: Poor   Executive Functions  Concentration: Fair  Attention Span: Fair  Recall: Fair  Fund of Knowledge: Fair  Language: Fair   Psychomotor Activity  Psychomotor Activity:No data recorded  Assets  Assets: Communication Skills; Desire for Improvement; Physical Health; Resilience   Sleep  Sleep:No data recorded Estimated Sleeping Duration (Last 24 Hours): 17.00-17.75 hours  No data recorded  Physical Exam  Physical Exam ROS Blood pressure 110/72, pulse (!) 59, temperature 97.8 F (36.6 C), temperature source Oral, resp. rate 17, last menstrual period 04/17/2024, SpO2 99%, unknown if currently breastfeeding. There is no height or weight on file to calculate BMI.  Treatment Plan Summary: Daily contact with patient to assess and evaluate symptoms and progress in treatment and Medication management  Patient is a 38 year old female with the above-stated past psychiatric history who is seen in follow-up.  Chart reviewed. Patient discussed with nursing. Patient reports improved sleep and mood; she continues to make plans for residential rehabilitation.    Diagnoses/ Active problems: -Alcohol use disorder -Stimulant use disorder -Substance-induced mood disorder   PLAN:  Safety and Monitoring: continue inpatient [facility] admission; 15-minute checks; daily contact with patient to assess and evaluate symptoms and progress in treatment; psychoeducation.Vital signs: q12 hours.   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    Discharge Planning:  Per SW, patient has been accepted to [facility] residential program. Patient can be discharged on Monday  05/04/2024 by 11 am. [facility] will arrange transportation to [facility] (  [address] [place] [address]). Patient will need 7 day supply and scripts.   Total Time Spent in Direct Patient Care:  I personally spent 35 minutes on the unit in direct patient care. The direct patient care time included face-to-face time with the patient, reviewing the patient's chart, communicating with other professionals, and coordinating care. Greater than 50% of this time was spent in counseling or coordinating care with the patient regarding goals of hospitalization, psycho-education, and discharge planning needs.   Neil Appl, MD 04/30/2024 10:57 AM

## 2024-04-30 NOTE — ED Notes (Signed)
 Patient alert & oriented x4. Pt on unit throughout shift q15 checks completed. Pt denies intent to harm self or others. Denies AVH. No signs of acute distress noted. No inappropriate behaviors observed or reported. Encouraged patient to notify staff if any thoughts of harm towards self or others arise. Patient verbalizes understanding and agreement. Eating and fluid intake were adequate. Medications administered as ordered; no adverse effects noted.

## 2024-04-30 NOTE — ED Notes (Signed)
 Patient remains on unit with no acute changes noted at this time. Alert and oriented. Interacting with peers/staff appropriately.  No current suicidal or homicidal ideation reported. No hallucinations or delusions observed. Patient is tolerating the milieu. Vitals within normal limits.  Pt refused medications, dr notified.

## 2024-05-01 DIAGNOSIS — F1994 Other psychoactive substance use, unspecified with psychoactive substance-induced mood disorder: Secondary | ICD-10-CM | POA: Diagnosis not present

## 2024-05-01 DIAGNOSIS — F250 Schizoaffective disorder, bipolar type: Secondary | ICD-10-CM | POA: Diagnosis not present

## 2024-05-01 DIAGNOSIS — F1020 Alcohol dependence, uncomplicated: Secondary | ICD-10-CM | POA: Diagnosis not present

## 2024-05-01 DIAGNOSIS — F1410 Cocaine abuse, uncomplicated: Secondary | ICD-10-CM | POA: Diagnosis not present

## 2024-05-01 MED ORDER — TRAZODONE HCL 150 MG PO TABS
150.0000 mg | ORAL_TABLET | Freq: Every evening | ORAL | Status: DC | PRN
  Administered 2024-05-01 – 2024-05-02 (×2): 150 mg via ORAL
  Filled 2024-05-01 (×2): qty 1

## 2024-05-01 MED ORDER — NICOTINE 21 MG/24HR TD PT24
21.0000 mg | MEDICATED_PATCH | Freq: Every day | TRANSDERMAL | Status: DC
  Administered 2024-05-01 – 2024-05-03 (×2): 21 mg via TRANSDERMAL
  Filled 2024-05-01 (×4): qty 1

## 2024-05-01 NOTE — ED Notes (Signed)
 Patient in hallway speaking with provider, calm and composed. No acute distress noted. No concerns voiced. No inappropriate behaviors observed or reported at this time. Informed patient to notify staff with any needs or assistance. Patient verbalized understanding or agreement. Safety checks in place per facility policy.

## 2024-05-01 NOTE — Progress Notes (Signed)
 Patient is alert and oriented x 4 with no acute distress noted.  Patient denies SI/HI/AVH at this time.  Patient is currently calm and cooperative during shift, and observed watching TV and on the phone during shift.  Patient is compliant scheduled medications during shift.  No issues noted or concerns voiced at this time.  Will continue Q 15 min safety checks for safety/behavior.

## 2024-05-01 NOTE — Care Management (Signed)
 [facility] CARE MANAGEMENT:  Clinical Research Associate and NT met with Mrs. Tiffany [name] in the lobby.  She reports she brought the client some cigarettes, a lighter, $20, and Mrs. Tiffany [name] contact information.  Mrs. Tiffany [name] reports she will contact the [facility] on Monday after she arrives.      Security, NT, and Clinical Research Associate went to the client's locker and placed off the items that were delivered in a clear bag that was already in her locker.    Client was made aware of the items dropped off and reports she's ready to leave on Monday.

## 2024-05-01 NOTE — Progress Notes (Signed)
 Patient came to the nurse's station with complaints of ear bump in her left ear.  She states that she attempted to place the earplug in her left ear, and it started hurting.  She states the pain/discomfort is 9/10 at this time, and it is mostly aching.  Patient was given Tylenol  650mg  prn for pain.  She was also informed that her concern will be mentioned in the morning for further assessment.

## 2024-05-01 NOTE — ED Notes (Signed)
 Patient alert & oriented x4. Denies intent to harm self or others when asked. Denies A/VH. Patient denies any pain at this time, states she is unsure when her last bowel movement was and that it has been an abnormally long time, requested something to facilitate a bowel movement. PRN medication given alongside scheduled medications with no concerns. No acute distress noted. Support and encouragement provided. Patient observed in milieu. No inappropriate behaviors observed or reported. Routine safety checks conducted per facility protocol. Encouraged patient to notify staff if any thoughts of harm towards self or others arise. Patient verbalizes understanding and agreement.

## 2024-05-01 NOTE — ED Notes (Signed)
 Patient sitting in dayroom, calm and composed. No acute distress noted. No concerns voiced. No inappropriate behaviors observed or reported at this time. Informed patient to notify staff with any needs or assistance. Patient verbalized understanding or agreement. Safety checks in place per facility policy.

## 2024-05-01 NOTE — ED Provider Notes (Signed)
 Behavioral Health Progress Note  Date and Time: 05/01/2024 8:42 AM Name: CASHAE WEICH MRN:  [id number]  Patient is a  38y.o. female who presented to [facility] as a walk in accompanied by [facility] team voluntarily, after her crisis team was doing a follow-up related to patient's call a few days ago. Patient endorsed suicidal ideations with a plan to drink bleach or walk out into traffic on day of admission to observation unit.    Interval History Patient was seen today for re-evaluation.  Nursing reports no events overnight. The patient has no issues with performing ADLs.  Patient has been medication compliant.    Patient was seen and interviewed by attending psychiatrist. Chart reviewed. Patient discussed during treatment team rounds.  Subjective:  On assessment patient reports feeling good and smiles. Reports she likes the combination of Remeron  and Trazodone  for sleep and asks if the dose of Trazodone  can go up for a better effect. She is happy that she was accepted to [facility] [place] and looking forward to transfer there on 12/1. She denies any complaints, mental or physical today. Reports feeling less depressed, less anxious. She denies auditory or visual hallucinations, denies feeling paranoid, unsafe, does not express any delusions. She denies thoughts or plans of hurting self or others. He reports no side effects from medications.   Labs: no new results for review.    Diagnosis:  Final diagnoses:  Substance induced mood disorder (HCC)  Alcohol use disorder  Stimulant use disorder  Polysubstance abuse (HCC)    Total Time spent with patient: 30 minutes  Past Psychiatric History: see H&P Past Medical History: see H&P Family History: see H&P Social History: see H&P   Sleep: Fair  Appetite:  Fair  Current Medications:  Current Facility-Administered Medications  Medication Dose Route Frequency Provider Last Rate Last Admin   acetaminophen  (TYLENOL ) tablet 650 mg  650  mg Oral Q6H PRN Faunce, Alina, DO       alum & mag hydroxide-simeth (MAALOX/MYLANTA) 200-200-20 MG/5ML suspension 30 mL  30 mL Oral Q4H PRN Faunce, Alina, DO       haloperidol  (HALDOL ) tablet 5 mg  5 mg Oral TID PRN Faunce, Alina, DO       And   diphenhydrAMINE  (BENADRYL ) capsule 50 mg  50 mg Oral TID PRN Faunce, Alina, DO       haloperidol  lactate (HALDOL ) injection 10 mg  10 mg Intramuscular TID PRN Faunce, Alina, DO       And   diphenhydrAMINE  (BENADRYL ) injection 50 mg  50 mg Intramuscular TID PRN Faunce, Alina, DO       And   LORazepam  (ATIVAN ) injection 2 mg  2 mg Intramuscular TID PRN Faunce, Alina, DO       haloperidol  lactate (HALDOL ) injection 5 mg  5 mg Intramuscular TID PRN Faunce, Alina, DO       And   diphenhydrAMINE  (BENADRYL ) injection 50 mg  50 mg Intramuscular TID PRN Faunce, Alina, DO       And   LORazepam  (ATIVAN ) injection 2 mg  2 mg Intramuscular TID PRN Faunce, Alina, DO       hydrOXYzine  (ATARAX ) tablet 25 mg  25 mg Oral TID PRN Faunce, Alina, DO   25 mg at 04/30/24 2112   magnesium  hydroxide (MILK OF MAGNESIA) suspension 30 mL  30 mL Oral Daily PRN Faunce, Alina, DO   30 mL at 05/01/24 0811   mirtazapine  (REMERON ) tablet 7.5 mg  7.5 mg Oral QHS  Idelle Nakai, DO   7.5 mg at 04/30/24 2112   multivitamin with minerals tablet 1 tablet  1 tablet Oral Daily Faunce, Alina, DO   1 tablet at 05/01/24 9190   thiamine  (VITAMIN B1) tablet 100 mg  100 mg Oral Daily Faunce, Alina, DO   100 mg at 05/01/24 0809   traZODone  (DESYREL ) tablet 150 mg  150 mg Oral QHS PRN Jaonna Word, MD       No current outpatient medications on file.    Labs  Lab Results:  Admission on 04/27/2024, Discharged on 04/28/2024  Component Date Value Ref Range Status   POC Amphetamine UR 04/27/2024 None Detected  NONE DETECTED (Cut Off Level 1000 ng/mL) Final   POC Secobarbital (BAR) 04/27/2024 None Detected  NONE DETECTED (Cut Off Level 300 ng/mL) Final   POC Buprenorphine (BUP) 04/27/2024 None  Detected  NONE DETECTED (Cut Off Level 10 ng/mL) Final   POC Oxazepam (BZO) 04/27/2024 None Detected  NONE DETECTED (Cut Off Level 300 ng/mL) Final   POC Cocaine UR 04/27/2024 Positive (A)  NONE DETECTED (Cut Off Level 300 ng/mL) Final   POC Methamphetamine UR 04/27/2024 None Detected  NONE DETECTED (Cut Off Level 1000 ng/mL) Final   POC Morphine  04/27/2024 None Detected  NONE DETECTED (Cut Off Level 300 ng/mL) Final   POC Methadone UR 04/27/2024 None Detected  NONE DETECTED (Cut Off Level 300 ng/mL) Final   POC Oxycodone  UR 04/27/2024 None Detected  NONE DETECTED (Cut Off Level 100 ng/mL) Final   POC Marijuana UR 04/27/2024 Positive (A)  NONE DETECTED (Cut Off Level 50 ng/mL) Final  Admission on 04/25/2024, Discharged on 04/26/2024  Component Date Value Ref Range Status   Sodium 04/25/2024 137  135 - 145 mmol/L Final   Potassium 04/25/2024 3.6  3.5 - 5.1 mmol/L Final   Chloride 04/25/2024 105  98 - 111 mmol/L Final   CO2 04/25/2024 22  22 - 32 mmol/L Final   Glucose, Bld 04/25/2024 97  70 - 99 mg/dL Final   Glucose reference range applies only to samples taken after fasting for at least 8 hours.   BUN 04/25/2024 <5 (L)  6 - 20 mg/dL Final   Creatinine, Ser 04/25/2024 0.71  0.44 - 1.00 mg/dL Final   Calcium 88/77/7974 8.4 (L)  8.9 - 10.3 mg/dL Final   Total Protein 88/77/7974 6.5  6.5 - 8.1 g/dL Final   Albumin 88/77/7974 3.7  3.5 - 5.0 g/dL Final   AST 88/77/7974 25  15 - 41 U/L Final   ALT 04/25/2024 13  0 - 44 U/L Final   Alkaline Phosphatase 04/25/2024 51  38 - 126 U/L Final   Total Bilirubin 04/25/2024 0.3  0.0 - 1.2 mg/dL Final   GFR, Estimated 04/25/2024 >60  >60 mL/min Final   Comment: (NOTE) Calculated using the CKD-EPI Creatinine Equation (2021)    Anion gap 04/25/2024 10  5 - 15 Final   Performed at [facility], [address], [place], [place] [address]   Alcohol, Ethyl (B) 04/25/2024 <15  <15 mg/dL Final   Comment: (NOTE) For medical purposes only. Performed at  [facility], [address] [place], [place] [address]    WBC 04/25/2024 4.1  4.0 - 10.5 K/uL Final   RBC 04/25/2024 3.67 (L)  3.87 - 5.11 MIL/uL Final   Hemoglobin 04/25/2024 10.3 (L)  12.0 - 15.0 g/dL Final   HCT 88/77/7974 31.8 (L)  36.0 - 46.0 % Final   MCV 04/25/2024  86.6  80.0 - 100.0 fL Final   MCH 04/25/2024 28.1  26.0 - 34.0 pg Final   MCHC 04/25/2024 32.4  30.0 - 36.0 g/dL Final   RDW 88/77/7974 17.3 (H)  11.5 - 15.5 % Final   Platelets 04/25/2024 418 (H)  150 - 400 K/uL Final   nRBC 04/25/2024 0.0  0.0 - 0.2 % Final   Performed at [facility], [address] [place], [place] [address]   Opiates 04/25/2024 NONE DETECTED  NONE DETECTED Final   Cocaine 04/25/2024 POSITIVE (A)  NONE DETECTED Final   Benzodiazepines 04/25/2024 NONE DETECTED  NONE DETECTED Final   Amphetamines 04/25/2024 NONE DETECTED  NONE DETECTED Final   Tetrahydrocannabinol 04/25/2024 POSITIVE (A)  NONE DETECTED Final   Barbiturates 04/25/2024 NONE DETECTED  NONE DETECTED Final   Comment: (NOTE) DRUG SCREEN FOR MEDICAL PURPOSES ONLY.  IF CONFIRMATION IS NEEDED FOR ANY PURPOSE, NOTIFY LAB WITHIN 5 DAYS.  LOWEST DETECTABLE LIMITS FOR URINE DRUG SCREEN Drug Class                     Cutoff (ng/mL) Amphetamine and metabolites    1000 Barbiturate and metabolites    200 Benzodiazepine                 200 Opiates and metabolites        300 Cocaine and metabolites        300 THC                            50 Performed at [facility], [address], [place], [place] [address]    Preg, Serum 04/25/2024 NEGATIVE  NEGATIVE Final   Comment:        THE SENSITIVITY OF THIS METHODOLOGY IS >10 mIU/mL. Performed at [facility], [address], [place], [place] [address]    Color, Urine 04/25/2024 YELLOW  YELLOW Final   APPearance 04/25/2024 CLEAR  CLEAR Final   Specific Gravity, Urine 04/25/2024 1.008  1.005 - 1.030 Final   pH 04/25/2024 7.0  5.0 - 8.0 Final   Glucose, UA  04/25/2024 NEGATIVE  NEGATIVE mg/dL Final   Hgb urine dipstick 04/25/2024 NEGATIVE  NEGATIVE Final   Bilirubin Urine 04/25/2024 NEGATIVE  NEGATIVE Final   Ketones, ur 04/25/2024 NEGATIVE  NEGATIVE mg/dL Final   Protein, ur 88/77/7974 NEGATIVE  NEGATIVE mg/dL Final   Nitrite 88/77/7974 NEGATIVE  NEGATIVE Final   Leukocytes,Ua 04/25/2024 NEGATIVE  NEGATIVE Final   Performed at [facility], [address], [place], [place] [address]   SARS Coronavirus 2 by RT PCR 04/25/2024 NEGATIVE  NEGATIVE Final   Performed at [facility], [address] [place], [place] [address]  Hospital Outpatient Visit on 01/14/2024  Component Date Value Ref Range Status   Color, Urine 01/14/2024 YELLOW  YELLOW Final   APPearance 01/14/2024 CLEAR  CLEAR Final   Specific Gravity, Urine 01/14/2024 1.017  1.005 - 1.030 Final   pH 01/14/2024 6.0  5.0 - 8.0 Final   Glucose, UA 01/14/2024 NEGATIVE  NEGATIVE mg/dL Final   Hgb urine dipstick 01/14/2024 NEGATIVE  NEGATIVE Final   Bilirubin Urine 01/14/2024 NEGATIVE  NEGATIVE Final   Ketones, ur 01/14/2024 NEGATIVE  NEGATIVE mg/dL Final   Protein, ur 91/87/7974 NEGATIVE  NEGATIVE mg/dL Final   Nitrite 91/87/7974 NEGATIVE  NEGATIVE Final   Leukocytes,Ua 01/14/2024 NEGATIVE  NEGATIVE Final   Performed  at [facility], [address] [place], [place] [address]   Sodium 01/14/2024 136  135 - 145 mmol/L Final   Potassium 01/14/2024 3.4 (L)  3.5 - 5.1 mmol/L Final   HEMOLYSIS AT THIS LEVEL MAY AFFECT RESULT   Chloride 01/14/2024 102  98 - 111 mmol/L Final   CO2 01/14/2024 23  22 - 32 mmol/L Final   Glucose, Bld 01/14/2024 96  70 - 99 mg/dL Final   Glucose reference range applies only to samples taken after fasting for at least 8 hours.   BUN 01/14/2024 7  6 - 20 mg/dL Final   Creatinine, Ser 01/14/2024 0.70  0.44 - 1.00 mg/dL Final   Calcium 91/87/7974 8.9  8.9 - 10.3 mg/dL Final   GFR, Estimated 01/14/2024 >60  >60 mL/min Final   Comment:  (NOTE) Calculated using the CKD-EPI Creatinine Equation (2021)    Anion gap 01/14/2024 11  5 - 15 Final   Performed at [facility], [address] [place], [place] [address]   WBC 01/14/2024 6.3  4.0 - 10.5 K/uL Final   RBC 01/14/2024 4.45  3.87 - 5.11 MIL/uL Final   Hemoglobin 01/14/2024 12.7  12.0 - 15.0 g/dL Final   HCT 91/87/7974 39.7  36.0 - 46.0 % Final   MCV 01/14/2024 89.2  80.0 - 100.0 fL Final   MCH 01/14/2024 28.5  26.0 - 34.0 pg Final   MCHC 01/14/2024 32.0  30.0 - 36.0 g/dL Final   RDW 91/87/7974 18.0 (H)  11.5 - 15.5 % Final   Platelets 01/14/2024 444 (H)  150 - 400 K/uL Final   nRBC 01/14/2024 0.0  0.0 - 0.2 % Final   Neutrophils Relative % 01/14/2024 49  % Final   Neutro Abs 01/14/2024 3.1  1.7 - 7.7 K/uL Final   Lymphocytes Relative 01/14/2024 37  % Final   Lymphs Abs 01/14/2024 2.4  0.7 - 4.0 K/uL Final   Monocytes Relative 01/14/2024 10  % Final   Monocytes Absolute 01/14/2024 0.6  0.1 - 1.0 K/uL Final   Eosinophils Relative 01/14/2024 3  % Final   Eosinophils Absolute 01/14/2024 0.2  0.0 - 0.5 K/uL Final   Basophils Relative 01/14/2024 1  % Final   Basophils Absolute 01/14/2024 0.1  0.0 - 0.1 K/uL Final   Immature Granulocytes 01/14/2024 0  % Final   Abs Immature Granulocytes 01/14/2024 0.01  0.00 - 0.07 K/uL Final   Performed at [facility], [address] [place], [place] [address]   Prothrombin Time 01/14/2024 13.5  11.4 - 15.2 seconds Final   INR 01/14/2024 1.0  0.8 - 1.2 Final   Comment: (NOTE) INR goal varies based on device and disease states. Performed at [facility], [address] [place], [place] [address]    Preg Test, Ur 01/14/2024 NEGATIVE  NEGATIVE Final   Comment:        THE SENSITIVITY OF THIS METHODOLOGY IS >20 mIU/mL. Performed at [facility], [address] [place], [place] [address]     Blood Alcohol level:  Lab Results  Component Value Date   [facility] <15 04/25/2024   ETH <10 06/03/2023    Metabolic  Disorder Labs: Lab Results  Component Value Date   HGBA1C 5.6 06/08/2023   MPG 114 06/08/2023   No results found for: PROLACTIN Lab Results  Component Value Date   CHOL 188 06/15/2023   TRIG 127 06/15/2023   HDL 72 06/15/2023  CHOLHDL 2.6 06/15/2023   VLDL 25 06/15/2023   LDLCALC 91 06/15/2023    Therapeutic Lab Levels: No results found for: LITHIUM No results found for: VALPROATE No results found for: CBMZ  Physical Findings   AIMS    Flowsheet Row [facility]  AIMS Total Score 0   AUDIT    Flowsheet Row Admission (Discharged[facility]  Alcohol Use Disorder Identification Test Final Score (AUDIT) 6   Flowsheet Row ED from 04/28/2024 in [facility] ED from [facility] from 04/25/2024 in [facility] at [facility]  C-SSRS RISK CATEGORY No Risk Low Risk No Risk     Musculoskeletal  Strength & Muscle Tone: within normal limits Gait & Station: normal Patient leans: N/A  Psychiatric Specialty Exam  Presentation  General Appearance:  Disheveled  Eye Contact: Good  Speech: Normal Rate  Speech Volume: Normal  Handedness: Right   Mood and Affect  Mood: Irritable  Affect: Congruent   Thought Process  Thought Processes: Goal Directed; Coherent  Descriptions of Associations:Intact  Orientation:Full (Time, Place and Person)  Thought Content:Logical  Diagnosis of Schizophrenia or Schizoaffective disorder in past: Yes    Hallucinations:No data recorded Ideas of Reference:None  Suicidal Thoughts:No data recorded Homicidal Thoughts:No data recorded  Sensorium  Memory: Immediate Fair  Judgment: Fair  Insight: Fair  Art Therapist  Concentration: Fair  Attention  Span: Fair  Recall: [employer] of Knowledge: Fair  Language: Fair   Psychomotor Activity  Psychomotor Activity:No data recorded  Assets  Assets: Communication Skills; Desire for Improvement; Physical Health; Resilience   Sleep  Sleep:No data recorded Estimated Sleeping Duration (Last 24 Hours): 12.00-14.75 hours  No data recorded  Physical Exam  Physical Exam ROS Blood pressure 105/62, pulse 75, temperature 98.4 F (36.9 C), temperature source Oral, resp. rate 17, last menstrual period 04/17/2024, SpO2 100%, unknown if currently breastfeeding. There is no height or weight on file to calculate BMI.  Treatment Plan Summary: Daily contact with patient to assess and evaluate symptoms and progress in treatment and Medication management  Patient is a 38 year old female with the above-stated past psychiatric history who is seen in follow-up.  Chart reviewed. Patient discussed with nursing. Patient continues to report improved sleep and mood; no current withdrawal symptoms; she continues to make plans for residential rehabilitation.    Diagnoses/ Active problems: -Alcohol use disorder -Stimulant use disorder -Substance-induced mood disorder   PLAN:  Safety and Monitoring: continue inpatient [facility] admission; 15-minute checks; daily contact with patient to assess and evaluate symptoms and progress in treatment; psychoeducation.Vital signs: q12 hours.   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    Discharge Planning:  Patient has been accepted to [facility] [place] [facility] residential program. Patient can be discharged on Monday 05/04/2024 by 11 am. [facility] [place] [address]). Patient will need 7 day supply and scripts.   Total Time Spent in  Direct Patient Care:  I personally spent 35 minutes on the unit in direct patient care. The direct patient care time included face-to-face time with the patient, reviewing the patient's chart, communicating with other professionals, and coordinating care. Greater than 50% of this time  was spent in counseling or coordinating care with the patient regarding goals of hospitalization, psycho-education, and discharge planning needs.   Neil Appl, MD 05/01/2024 8:42 AM

## 2024-05-01 NOTE — Group Note (Signed)
 Group Topic: Social Support  Group Date: 05/01/2024 Start Time: 1700 End Time: 1730 Facilitators: Florina Glas, Zane HERO, RN  Department: [facility]  Number of Participants: 1  Group Focus: check in Treatment Modality:  Individual Therapy Interventions utilized were support Purpose: express feelings  Name: Tiffany [name] Date of Birth: 09-12-1985  MR: [id number]    Level of Participation: active Quality of Participation: attentive and cooperative Interactions with others: gave feedback Mood/Affect: appropriate Triggers (if applicable): None identified at this time Cognition: coherent/clear and logical Progress: Gaining insight Response: Patient voiced no concerns regarding the unit at this time. Voices understanding of medications as administered. Understands who to reach out to in case of future concerns Plan: patient will be encouraged to continue to attend groups/programming on the unit  Patients Problems:  Patient Active Problem List   Diagnosis Date Noted   Cocaine use disorder (HCC) 06/14/2023   Delta-9-tetrahydrocannabinol (THC) dependence (HCC) 06/14/2023   Alcohol use disorder 06/14/2023   Schizoaffective disorder, bipolar type (HCC) 06/13/2023   SAH (subarachnoid hemorrhage) (HCC) 06/03/2023   Acute pyelonephritis 08/17/2019   SIRS (systemic inflammatory response syndrome) (HCC) 08/17/2019   Hypokalemia 08/17/2019   Incomplete miscarriage 05/13/2012   Polysubstance abuse (HCC) 10/21/2011   Depression 10/21/2011

## 2024-05-01 NOTE — Group Note (Signed)
 Group Topic: Social Support  Group Date: 05/01/2024 Start Time: 2000 End Time: 2030 Facilitators: Joan Plowman B  Department: [facility]  Number of Participants: 1  Group Focus: communication Treatment Modality:  Individual Therapy Interventions utilized were support Purpose: increase insight  Name: Tiffany [name] Date of Birth: [birth year]  MR: [id number]    Level of Participation: PT DID NOT ATTEND GROUP   Patients Problems:  Patient Active Problem List   Diagnosis Date Noted   Cocaine use disorder (HCC) 06/14/2023   Delta-9-tetrahydrocannabinol (THC) dependence (HCC) 06/14/2023   Alcohol use disorder 06/14/2023   Schizoaffective disorder, bipolar type (HCC) 06/13/2023   SAH (subarachnoid hemorrhage) (HCC) 06/03/2023   Acute pyelonephritis 08/17/2019   SIRS (systemic inflammatory response syndrome) (HCC) 08/17/2019   Hypokalemia 08/17/2019   Incomplete miscarriage 05/13/2012   Polysubstance abuse (HCC) 10/21/2011   Depression 10/21/2011

## 2024-05-01 NOTE — Group Note (Signed)
 Group Topic: Relapse and Recovery  Group Date: 05/01/2024 Start Time: 1300 End Time: 1330 Facilitators: Lonzell Dwayne RAMAN, NT, MHT 2 Department: [facility]  Number of Participants: 3  Group Focus: individual meeting and moving forward  Treatment Modality:  Individual Therapy Interventions utilized were problem solving and Support  Purpose: enhance coping skills  Name: Tiffany [name] Date of Birth: [birth year]  MR: [id number]    Level of Participation: Patient refused to attend group Quality of Participation: N/A Interactions with others: N/A Mood/Affect: N/A Triggers (if applicable): N/A Cognition: N/A Progress: N/A Response: N/A Plan: N/A  Patients Problems:  Patient Active Problem List   Diagnosis Date Noted   Cocaine use disorder (HCC) 06/14/2023   Delta-9-tetrahydrocannabinol (THC) dependence (HCC) 06/14/2023   Alcohol use disorder 06/14/2023   Schizoaffective disorder, bipolar type (HCC) 06/13/2023   SAH (subarachnoid hemorrhage) (HCC) 06/03/2023   Acute pyelonephritis 08/17/2019   SIRS (systemic inflammatory response syndrome) (HCC) 08/17/2019   Hypokalemia 08/17/2019   Incomplete miscarriage 05/13/2012   Polysubstance abuse (HCC) 10/21/2011   Depression 10/21/2011

## 2024-05-01 NOTE — ED Notes (Signed)
 Pt sleeping in bed, no acute distress noted. Respirations even and unlabored. Continue to monitor for safety.

## 2024-05-01 NOTE — ED Notes (Signed)
 Pt denies SI/HI/AVH. Compliant with scheduled meds; prn trazodone  and atarax  given. She was calm and cooperative, appropriate interactions with peers. No acute distress noted. Continue to monitor for safety.

## 2024-05-02 DIAGNOSIS — F1410 Cocaine abuse, uncomplicated: Secondary | ICD-10-CM | POA: Diagnosis not present

## 2024-05-02 DIAGNOSIS — F1020 Alcohol dependence, uncomplicated: Secondary | ICD-10-CM | POA: Diagnosis not present

## 2024-05-02 DIAGNOSIS — F1994 Other psychoactive substance use, unspecified with psychoactive substance-induced mood disorder: Secondary | ICD-10-CM | POA: Diagnosis not present

## 2024-05-02 DIAGNOSIS — F250 Schizoaffective disorder, bipolar type: Secondary | ICD-10-CM | POA: Diagnosis not present

## 2024-05-02 MED ORDER — CLOTRIMAZOLE 1 % EX CREA
TOPICAL_CREAM | Freq: Two times a day (BID) | CUTANEOUS | Status: DC
  Administered 2024-05-02 – 2024-05-03 (×3): 1 via TOPICAL
  Filled 2024-05-02 (×2): qty 15
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
## 2024-05-02 NOTE — Progress Notes (Signed)
 Patient is currently resting at this time with no acute distress noted.  Respirations present, even and unlabored.  No issues noted at this time.  Will continue Q 15 min safety checks for safety/behavior.

## 2024-05-02 NOTE — ED Notes (Signed)
 Patient resting with eyes closed in no apparent acute distress. Respirations even and unlabored. Environment secured. Safety checks in place according to facility policy.

## 2024-05-02 NOTE — Group Note (Signed)
 Group Topic: Emotional Regulation  Group Date: 05/02/2024 Start Time: 1640 End Time: 1720 Facilitators: Elnor Keven SAILOR  Department: [facility]  Number of Participants: 7  Group Focus: coping skills Treatment Modality:  Psychoeducation Interventions utilized were patient education Purpose: reinforce self-care  Name: Tiffany [name] Date of Birth: 1986-03-19  MR: [id number]    Level of Participation: moderate Quality of Participation: engaged Interactions with others: gave feedback Mood/Affect: appropriate Triggers (if applicable): NA Cognition: coherent/clear Progress: Moderate Response: Discussed importance of grounding techniques in times of difficulty. Participated in grounding exercise and scavenger hunt to find sensory items.  Plan: follow-up needed  Patients Problems:  Patient Active Problem List   Diagnosis Date Noted   Cocaine use disorder (HCC) 06/14/2023   Delta-9-tetrahydrocannabinol (THC) dependence (HCC) 06/14/2023   Alcohol use disorder 06/14/2023   Schizoaffective disorder, bipolar type (HCC) 06/13/2023   SAH (subarachnoid hemorrhage) (HCC) 06/03/2023   Acute pyelonephritis 08/17/2019   SIRS (systemic inflammatory response syndrome) (HCC) 08/17/2019   Hypokalemia 08/17/2019   Incomplete miscarriage 05/13/2012   Polysubstance abuse (HCC) 10/21/2011   Depression 10/21/2011

## 2024-05-02 NOTE — Group Note (Signed)
 Group Topic: Relapse and Recovery  Group Date: 05/02/2024 Start Time: 2000 End Time: 2100 Facilitators: Joan Plowman B  Department: [facility]  Number of Participants: 4  Group Focus: chemical dependency education Treatment Modality:  Psychoeducation and Spiritual Interventions utilized were patient education Purpose: enhance coping skills and relapse prevention strategies  Name: Tiffany [name] Date of Birth: [birth year]  MR: [id number]    Level of Participation: active Quality of Participation: attentive and cooperative Interactions with others: gave feedback Mood/Affect: appropriate Triggers (if applicable): NA Cognition: coherent/clear Progress: Gaining insight Response: NA Plan: patient will be encouraged to keep going to groups  Patients Problems:  Patient Active Problem List   Diagnosis Date Noted   Cocaine use disorder (HCC) 06/14/2023   Delta-9-tetrahydrocannabinol (THC) dependence (HCC) 06/14/2023   Alcohol use disorder 06/14/2023   Schizoaffective disorder, bipolar type (HCC) 06/13/2023   SAH (subarachnoid hemorrhage) (HCC) 06/03/2023   Acute pyelonephritis 08/17/2019   SIRS (systemic inflammatory response syndrome) (HCC) 08/17/2019   Hypokalemia 08/17/2019   Incomplete miscarriage 05/13/2012   Polysubstance abuse (HCC) 10/21/2011   Depression 10/21/2011

## 2024-05-02 NOTE — Group Note (Signed)
 Group Topic: Wellness  Group Date: 05/02/2024 Start Time: 1300 End Time: 1345 Facilitators: Lawrnce Garvin PARAS, MD; Jodiann Ognibene, Zane HERO, RN  Department: [facility]  Number of Participants: 5  Group Focus: MD group Treatment Modality:  Interpersonal Therapy and Psychodynamic Psychotherapy Interventions utilized were assignment, exploration, group exercise, and patient education Purpose: increase insight, express feelings  Name: Tiffany [name] Date of Birth: Jun 22, 1985  MR: [id number]    Level of Participation: active Quality of Participation: attentive and cooperative Interactions with others: gave feedback Mood/Affect: appropriate Triggers (if applicable): None identified at this time Cognition: coherent/clear and logical, often diverting from topic Progress: Gaining insight Response: Patients discussed mental health, brain physiology, sunshine, cell function, and the importance of sleep. Plan: patient will be encouraged to continue to attend groups  Patients Problems:  Patient Active Problem List   Diagnosis Date Noted   Cocaine use disorder (HCC) 06/14/2023   Delta-9-tetrahydrocannabinol (THC) dependence (HCC) 06/14/2023   Alcohol use disorder 06/14/2023   Schizoaffective disorder, bipolar type (HCC) 06/13/2023   SAH (subarachnoid hemorrhage) (HCC) 06/03/2023   Acute pyelonephritis 08/17/2019   SIRS (systemic inflammatory response syndrome) (HCC) 08/17/2019   Hypokalemia 08/17/2019   Incomplete miscarriage 05/13/2012   Polysubstance abuse (HCC) 10/21/2011   Depression 10/21/2011

## 2024-05-02 NOTE — ED Notes (Signed)
 Patient sitting in dayroom interacting with peers. No acute distress noted. No concerns voiced. No inappropriate behaviors observed or reported at this time. Informed patient to notify staff with any needs or assistance. Patient verbalized understanding or agreement. Safety checks in place per facility policy.

## 2024-05-02 NOTE — ED Notes (Signed)

## 2024-05-02 NOTE — ED Notes (Signed)
 Patient is in the dayroom calm and composed eating snacks. NAD. Denies SI/HI/AVH. Environment secured per policy. Will monitor for safety.

## 2024-05-02 NOTE — ED Notes (Signed)
 Patient in hallway on phone calm and composed. No acute distress noted. No concerns voiced. No inappropriate behaviors observed or reported at this time. Informed patient to notify staff with any needs or assistance. Patient verbalized understanding or agreement. Safety checks in place per facility policy.

## 2024-05-02 NOTE — Progress Notes (Addendum)
 Patient is currently resting with eyes closed.  No acute distress noted.  Respirations present, even and unlabored.  No current issues noted.  Will continue Q 15 min safety checks for safety/behavior.

## 2024-05-03 DIAGNOSIS — F1994 Other psychoactive substance use, unspecified with psychoactive substance-induced mood disorder: Secondary | ICD-10-CM | POA: Diagnosis not present

## 2024-05-03 DIAGNOSIS — F1410 Cocaine abuse, uncomplicated: Secondary | ICD-10-CM | POA: Diagnosis not present

## 2024-05-03 DIAGNOSIS — F250 Schizoaffective disorder, bipolar type: Secondary | ICD-10-CM | POA: Diagnosis not present

## 2024-05-03 DIAGNOSIS — F1020 Alcohol dependence, uncomplicated: Secondary | ICD-10-CM | POA: Diagnosis not present

## 2024-05-03 MED ORDER — CLOTRIMAZOLE 1 % EX CREA: TOPICAL_CREAM | Freq: Two times a day (BID) | CUTANEOUS | 0 refills | Status: AC

## 2024-05-03 MED ORDER — MIRTAZAPINE 15 MG PO TABS: 15.0000 mg | ORAL_TABLET | Freq: Every day | ORAL | 1 refills | Status: DC

## 2024-05-03 MED ORDER — ADULT MULTIVITAMIN W/MINERALS CH: 1.0000 | ORAL_TABLET | Freq: Every day | ORAL | 1 refills | Status: DC

## 2024-05-03 MED ORDER — VITAMIN B-1 100 MG PO TABS: 100.0000 mg | ORAL_TABLET | Freq: Every day | ORAL | 1 refills | Status: DC

## 2024-05-03 MED ORDER — HYDROXYZINE HCL 25 MG PO TABS: 25.0000 mg | ORAL_TABLET | Freq: Three times a day (TID) | ORAL | 0 refills | Status: AC | PRN

## 2024-05-03 MED ORDER — TRAZODONE HCL 100 MG PO TABS
200.0000 mg | ORAL_TABLET | Freq: Every evening | ORAL | Status: DC | PRN
  Administered 2024-05-03: 200 mg via ORAL
  Filled 2024-05-03: qty 2

## 2024-05-03 MED ORDER — TRAZODONE HCL 100 MG PO TABS: 200.0000 mg | ORAL_TABLET | Freq: Every evening | ORAL | 1 refills | Status: DC | PRN

## 2024-05-03 MED ORDER — MIRTAZAPINE 15 MG PO TABS
15.0000 mg | ORAL_TABLET | Freq: Every day | ORAL | Status: DC
  Administered 2024-05-03: 15 mg via ORAL
  Filled 2024-05-03: qty 1

## 2024-05-03 MED ORDER — NICOTINE 21 MG/24HR TD PT24: 21.0000 mg | MEDICATED_PATCH | Freq: Every day | TRANSDERMAL | 0 refills | Status: DC

## 2024-05-03 NOTE — ED Notes (Signed)
 Patient in bed sleeping, no distress noted, respirations even and unlabored, continue to monitor for safety

## 2024-05-03 NOTE — ED Notes (Signed)
 Paitent provided dinner.

## 2024-05-03 NOTE — ED Notes (Signed)
 Pt complained of headache.  Sleeping at present , Tylenol  650 mg to relieve discomfort . Ptresting quietly, excited and anxious about discharge and transfer to another facility in the AM.  Pt encouraged to verbalize thoughts and feelings to help de-escalate anxiety.

## 2024-05-03 NOTE — Group Note (Signed)
 Group Topic: Wellness  Group Date: 05/03/2024 Start Time: 1300 End Time: 1330 Facilitators: Lawrnce Garvin PARAS, MD; Veverly Oddis BRAVO, [place]  Department: [facility]  Number of Participants: 6  Group Focus: Stress on the Brain Treatment Modality:  Cognitive Behavioral Therapy Interventions utilized were clarifictaion Purpose: increase insight  Name: Tiffany [name] Date of Birth: 11-18-1985  MR: [id number]    Level of Participation: active Quality of Participation: engaged Interactions with others: gave feedback Mood/Affect: bright Triggers (if applicable): none Cognition: coherent/clear Progress: Moderate Response: I am curious if there is anything that I can do to help with stress. Plan: patient will be encouraged to follow up with things that can help reduce stress as well as encouraged to keep attending group.  Patients Problems:  Patient Active Problem List   Diagnosis Date Noted   Cocaine use disorder (HCC) 06/14/2023   Delta-9-tetrahydrocannabinol (THC) dependence (HCC) 06/14/2023   Alcohol use disorder 06/14/2023   Schizoaffective disorder, bipolar type (HCC) 06/13/2023   SAH (subarachnoid hemorrhage) (HCC) 06/03/2023   Acute pyelonephritis 08/17/2019   SIRS (systemic inflammatory response syndrome) (HCC) 08/17/2019   Hypokalemia 08/17/2019   Incomplete miscarriage 05/13/2012   Polysubstance abuse (HCC) 10/21/2011   Depression 10/21/2011

## 2024-05-03 NOTE — ED Notes (Signed)
 Pt

## 2024-05-03 NOTE — Group Note (Signed)
 Group Topic: Healthy Self Image and Positive Change  Group Date: 05/03/2024 Start Time: 2030 End Time: 2100 Facilitators: Anice Benton LABOR, NT  Department: [facility]  Number of Participants: 4  Group Focus: communication, goals/reality orientation, and self-awareness Treatment Modality:  Cognitive Behavioral Therapy Interventions utilized were group exercise Purpose: express feelings and increase insight  Name: Tiffany [name] Date of Birth: [birth year]  MR: [id number]    Level of Participation: active Quality of Participation: cooperative Interactions with others: gave feedback Mood/Affect: appropriate and positive Triggers (if applicable): N/A Cognition: coherent/clear and goal directed Progress: Moderate Response: good Plan: follow-up needed  Patients Problems:  Patient Active Problem List   Diagnosis Date Noted   Cocaine use disorder (HCC) 06/14/2023   Delta-9-tetrahydrocannabinol (THC) dependence (HCC) 06/14/2023   Alcohol use disorder 06/14/2023   Schizoaffective disorder, bipolar type (HCC) 06/13/2023   SAH (subarachnoid hemorrhage) (HCC) 06/03/2023   Acute pyelonephritis 08/17/2019   SIRS (systemic inflammatory response syndrome) (HCC) 08/17/2019   Hypokalemia 08/17/2019   Incomplete miscarriage 05/13/2012   Polysubstance abuse (HCC) 10/21/2011   Depression 10/21/2011
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
## 2024-05-03 NOTE — ED Notes (Signed)

## 2024-05-03 NOTE — ED Notes (Signed)
 Pt awake during the beginning of shift .  Anixous, pacing,  apologizing pacing, requesting same meds that she had at [facility], desiring to rest tonight. Assurred Pt that she will receive medications as physician ordered.  Pt smiles and says ok. Denies SI.Will promote safety.

## 2024-05-03 NOTE — ED Notes (Signed)
 Paitent provided breakfast.

## 2024-05-03 NOTE — ED Notes (Signed)
 Paitent provided lunch.

## 2024-05-03 NOTE — ED Notes (Signed)
 Patient is in the bedroom calm and sleeping. NAD Respirations even and unlabored. Environment secured. Will continue to monitor for safety.

## 2024-05-04 DIAGNOSIS — F1020 Alcohol dependence, uncomplicated: Secondary | ICD-10-CM | POA: Diagnosis not present

## 2024-05-04 DIAGNOSIS — F1410 Cocaine abuse, uncomplicated: Secondary | ICD-10-CM | POA: Diagnosis not present

## 2024-05-04 DIAGNOSIS — F1994 Other psychoactive substance use, unspecified with psychoactive substance-induced mood disorder: Secondary | ICD-10-CM | POA: Diagnosis not present

## 2024-05-04 DIAGNOSIS — F250 Schizoaffective disorder, bipolar type: Secondary | ICD-10-CM | POA: Diagnosis not present

## 2024-05-04 LAB — GC/CHLAMYDIA PROBE AMP (~~LOC~~) NOT AT ARMC
Chlamydia: NEGATIVE
Comment: NEGATIVE
Comment: NORMAL
Neisseria Gonorrhea: NEGATIVE

## 2024-05-04 NOTE — ED Notes (Signed)
 Patient alert, Mood appears calm. Pt dc to [facility], picked up by [facility] [place].

## 2024-05-04 NOTE — Care Management (Signed)
 [facility] Care Management...  Writer met with patient to discuss discharge planning.  Patient scheduled to discharge today 05/04/2024 to [facility].  Writer reached out patient's Probation Officer E[place] Jama Berlin left HIPAA compliant message for return Nurse, Adult reached out to [employer] SW with adult Careers Adviser left HIPAA compliant message for return call
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
## 2024-05-04 NOTE — Group Note (Signed)
 Group Topic: Relapse and Recovery  Group Date: 05/04/2024 Start Time: 1100 End Time: 1200 Facilitators: Cesar Rogerson, LCSW  Department: [facility]  Number of Participants: 6  Group Focus: chemical dependency issues and substance abuse education Treatment Modality:  Cognitive Behavioral Therapy Interventions utilized were patient education and problem solving Purpose: enhance coping skills, explore maladaptive thinking, express feelings, and relapse prevention strategies   Writer explored with the group triggers and coping skills by utilizing a CBT worksheet to explore triggers.  Triggers included environments, people, and etc.  Writer allowed all group members to share their thoughts and feelings.  Name: MARLIA SCHEWE Date of Birth: [birth year]  MR: [id number]    Level of Participation: active Quality of Participation: cooperative, engaged, and motivated Interactions with others: gave feedback Mood/Affect: positive Triggers (if applicable): her roommate and other people not being clean. Cognition: insightful Progress: Gaining insight Response: Client is going to [facility] today. Plan: No additional follow up is needed  Patients Problems:  Patient Active Problem List   Diagnosis Date Noted   Cocaine use disorder (HCC) 06/14/2023   Delta-9-tetrahydrocannabinol (THC) dependence (HCC) 06/14/2023   Alcohol use disorder 06/14/2023   Schizoaffective disorder, bipolar type (HCC) 06/13/2023   SAH (subarachnoid hemorrhage) (HCC) 06/03/2023   Acute pyelonephritis 08/17/2019   SIRS (systemic inflammatory response syndrome) (HCC) 08/17/2019   Hypokalemia 08/17/2019   Incomplete miscarriage 05/13/2012   Polysubstance abuse (HCC) 10/21/2011   Depression 10/21/2011

## 2024-05-04 NOTE — ED Notes (Signed)
 Pt sleeping quietly.  Safety maintained

## 2024-06-12 ENCOUNTER — Emergency Department (HOSPITAL_COMMUNITY)
Admission: EM | Admit: 2024-06-12 | Discharge: 2024-06-12 | Disposition: A | Discharge status: Home / Self Care | Payer: MEDICAID | Attending: Emergency Medicine | Admitting: Emergency Medicine

## 2024-06-12 ENCOUNTER — Emergency Department (HOSPITAL_COMMUNITY): Payer: MEDICAID

## 2024-06-12 ENCOUNTER — Other Ambulatory Visit: Payer: Self-pay

## 2024-06-12 DIAGNOSIS — Y990 Civilian activity done for income or pay: Secondary | ICD-10-CM | POA: Diagnosis not present

## 2024-06-12 DIAGNOSIS — S39012A Strain of muscle, fascia and tendon of lower back, initial encounter: Secondary | ICD-10-CM | POA: Diagnosis not present

## 2024-06-12 DIAGNOSIS — X500XXA Overexertion from strenuous movement or load, initial encounter: Secondary | ICD-10-CM | POA: Insufficient documentation

## 2024-06-12 DIAGNOSIS — F1910 Other psychoactive substance abuse, uncomplicated: Secondary | ICD-10-CM | POA: Insufficient documentation

## 2024-06-12 DIAGNOSIS — Z760 Encounter for issue of repeat prescription: Secondary | ICD-10-CM | POA: Diagnosis not present

## 2024-06-12 DIAGNOSIS — J101 Influenza due to other identified influenza virus with other respiratory manifestations: Secondary | ICD-10-CM | POA: Diagnosis not present

## 2024-06-12 DIAGNOSIS — M545 Low back pain, unspecified: Secondary | ICD-10-CM | POA: Diagnosis present

## 2024-06-12 LAB — BASIC METABOLIC PANEL WITH GFR
Anion gap: 13 (ref 5–15)
BUN: 11 mg/dL (ref 6–20)
CO2: 23 mmol/L (ref 22–32)
Calcium: 8.9 mg/dL (ref 8.9–10.3)
Chloride: 99 mmol/L (ref 98–111)
Creatinine, Ser: 0.84 mg/dL (ref 0.44–1.00)
GFR, Estimated: >60 mL/min
Glucose, Bld: 94 mg/dL (ref 70–99)
Potassium: 3.9 mmol/L (ref 3.5–5.1)
Sodium: 135 mmol/L (ref 135–145)

## 2024-06-12 LAB — CBC WITH DIFFERENTIAL/PLATELET
Abs Immature Granulocytes: 0.02 K/uL (ref 0.00–0.07)
Basophils Absolute: 0.1 K/uL (ref 0.0–0.1)
Basophils Relative: 1 %
Eosinophils Absolute: 0.1 K/uL (ref 0.0–0.5)
Eosinophils Relative: 1 %
HCT: 37.3 % (ref 36.0–46.0)
Hemoglobin: 12.1 g/dL (ref 12.0–15.0)
Immature Granulocytes: 0 %
Lymphocytes Relative: 6 %
Lymphs Abs: 0.6 K/uL — ABNORMAL LOW (ref 0.7–4.0)
MCH: 27.9 pg (ref 26.0–34.0)
MCHC: 32.4 g/dL (ref 30.0–36.0)
MCV: 85.9 fL (ref 80.0–100.0)
Monocytes Absolute: 1.3 K/uL — ABNORMAL HIGH (ref 0.1–1.0)
Monocytes Relative: 13 %
Neutro Abs: 7.4 K/uL (ref 1.7–7.7)
Neutrophils Relative %: 79 %
Platelets: 406 K/uL — ABNORMAL HIGH (ref 150–400)
RBC: 4.34 MIL/uL (ref 3.87–5.11)
RDW: 18.1 % — ABNORMAL HIGH (ref 11.5–15.5)
WBC: 9.4 K/uL (ref 4.0–10.5)
nRBC: 0 % (ref 0.0–0.2)

## 2024-06-12 LAB — RESP PANEL BY RT-PCR (RSV, FLU A&B, COVID)  RVPGX2
Influenza A by PCR: POSITIVE — AB
Influenza B by PCR: NEGATIVE
Resp Syncytial Virus by PCR: NEGATIVE
SARS Coronavirus 2 by RT PCR: NEGATIVE

## 2024-06-12 MED ORDER — TRAZODONE HCL 100 MG PO TABS: 200.0000 mg | ORAL_TABLET | Freq: Every evening | ORAL | 1 refills | Status: AC | PRN

## 2024-06-12 MED ORDER — CYCLOBENZAPRINE HCL 10 MG PO TABS: 10.0000 mg | ORAL_TABLET | Freq: Two times a day (BID) | ORAL | 0 refills | Status: AC | PRN

## 2024-06-12 MED ORDER — VITAMIN B-1 100 MG PO TABS: 100.0000 mg | ORAL_TABLET | Freq: Every day | ORAL | 1 refills | Status: AC

## 2024-06-12 MED ORDER — ACETAMINOPHEN 500 MG PO TABS
1000.0000 mg | ORAL_TABLET | Freq: Once | ORAL | Status: AC
  Administered 2024-06-12: 1000 mg via ORAL
  Filled 2024-06-12: qty 2

## 2024-06-12 MED ORDER — CYCLOBENZAPRINE HCL 10 MG PO TABS
10.0000 mg | ORAL_TABLET | Freq: Once | ORAL | Status: AC
  Administered 2024-06-12: 10 mg via ORAL
  Filled 2024-06-12: qty 1

## 2024-06-12 MED ORDER — TRAZODONE HCL 100 MG PO TABS
200.0000 mg | ORAL_TABLET | Freq: Once | ORAL | Status: AC
  Administered 2024-06-12: 200 mg via ORAL
  Filled 2024-06-12: qty 2

## 2024-06-12 MED ORDER — NICOTINE 21 MG/24HR TD PT24: 21.0000 mg | MEDICATED_PATCH | Freq: Every day | TRANSDERMAL | 0 refills | Status: AC

## 2024-06-12 MED ORDER — ADULT MULTIVITAMIN W/MINERALS CH: 1.0000 | ORAL_TABLET | Freq: Every day | ORAL | 1 refills | Status: AC

## 2024-06-12 MED ORDER — MIRTAZAPINE 15 MG PO TABS: 15.0000 mg | ORAL_TABLET | Freq: Every day | ORAL | 1 refills | Status: AC

## 2024-06-12 NOTE — ED Triage Notes (Addendum)
 BIB EMS , c/o lower back pain. Reports lifting heavy boxes at work and may have pulled something, fever for EMS. Pt also endorses out of rehab today and use crack cocaine

## 2024-06-12 NOTE — ED Provider Triage Note (Addendum)
 Emergency Medicine Provider Triage Evaluation Note  NAHOMI HEGNER , a 39 y.o. female  was evaluated in triage.  Pt complains of low back pain after lifting heavy boxes at work. Also with recent fever, cough, SOB over the past 2 days. Also with a little bit of nausea.   Patient later telling triage RN that she had some cocaine this morning.   Review of Systems  Positive:  Negative:   Physical Exam  There were no vitals taken for this visit. Gen:   Awake, no distress   Resp:  Normal effort  MSK:   Moves extremities without difficulty  Other:    Medical Decision Making  Medically screening exam initiated at 12:50 PM.  Appropriate orders placed.  LAQUANDRA CARRILLO was informed that the remainder of the evaluation will be completed by another provider, this initial triage assessment does not replace that evaluation, and the importance of remaining in the ED until their evaluation is complete.        Hoy Fraction F, [place] 06/12/24 1259

## 2024-06-12 NOTE — Discharge Instructions (Signed)
 You were seen in the emergency department for your back pain.  You likely pulled the muscles in your back.  You can take Tylenol  every 6 hours as needed for pain as well as use ice or heat and I have given you Flexeril  for breakthrough pain.  This can make you drowsy so do not take before driving, working or operating heavy machinery.  You also tested positive for the flu.  You can take Tylenol  as needed for your fevers and can use over-the-counter decongestants to help with your symptoms.  You should follow-up in the primary care clinic to have your symptoms rechecked and to establish care.  You should return to the emergency department if you develop numbness or weakness in your legs and you are unable to walk, you are unable to urinate, you have severe shortness of breath or if you have any other new or concerning symptoms.

## 2024-06-12 NOTE — ED Provider Notes (Signed)
 " [facility] EMERGENCY DEPARTMENT AT [facility] Provider Note   CSN: [id number] Arrival date & time: 06/12/24  1243     Patient presents with: Back Pain   Tiffany [name] is a 39 y.o. female.   Patient is a 39 year old female With a past medical history of schizoaffective and polysubstance use presenting to the emergency department with back pain.  Patient states at work last night she lifted a heavy box and felt a pop in her low back and has had pain since then.  She denies any numbness or weakness, saddle anesthesia, loss of bowel or bladder function.  He denies any dysuria or hematuria.  She also reports that she has been having a runny nose with cough and congestion that she attributed to dust at her work.  Patient is also requesting medication refills.  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  She states that she is not have a primary care doctor.  The history is provided by the patient.  Back Pain      Prior to Admission medications  Medication Sig Start Date End Date Taking? Authorizing Provider  cyclobenzaprine  (FLEXERIL ) 10 MG tablet Take 1 tablet (10 mg total) by mouth 2 (two) times daily as needed for muscle spasms. 06/12/24  Yes Ellouise, Jonaya Freshour K, DO  mirtazapine  (REMERON ) 15 MG tablet Take 1 tablet (15 mg total) by mouth daily. 06/12/24 08/11/24  Kingsley, Vena Bassinger K, DO  Multiple Vitamin (MULTIVITAMIN WITH MINERALS) TABS tablet Take 1 tablet by mouth daily. 06/12/24 08/11/24  Kingsley, Maricela Kawahara K, DO  nicotine  (NICODERM CQ  - DOSED IN MG/24 HOURS) 21 mg/24hr patch Place 1 patch (21 mg total) onto the skin daily. 06/12/24   Kingsley, Tiny Chaudhary K, DO  thiamine  (VITAMIN B-1) 100 MG tablet Take 1 tablet (100 mg total) by mouth daily. 06/12/24 08/11/24  Kingsley, Dehlia Kilner K, DO  traZODone  (DESYREL ) 100 MG tablet Take 2 tablets (200 mg total) by mouth at bedtime as needed for sleep. 06/12/24 08/11/24   Kingsley, Zala Degrasse K, DO    Allergies: Bee venom, Shellfish allergy, Ibuprofen , Pollen extract, Venlafaxine, and Aspirin    Review of Systems  Musculoskeletal:  Positive for back pain.    Updated Vital Signs BP 122/82 (BP Location: Right Arm)   Pulse (!) 111   Temp (!) 100.9 F (38.3 C) (Oral)   Resp 15   LMP  (LMP Unknown)   SpO2 100%   Physical Exam Vitals and nursing note reviewed.  Constitutional:      General: She is not in acute distress.    Appearance: Normal appearance.  HENT:     Head: Normocephalic.     Nose: Rhinorrhea present.     Mouth/Throat:     Mouth: Mucous membranes are moist.     Pharynx: Oropharynx is clear.  Eyes:     Extraocular Movements: Extraocular movements intact.     Conjunctiva/sclera: Conjunctivae normal.  Cardiovascular:     Rate and Rhythm: Normal rate and regular rhythm.     Heart sounds: Normal heart sounds.  Pulmonary:     Effort: Pulmonary effort is normal.     Breath sounds: Normal breath sounds.  Abdominal:     General: Abdomen is flat.     Palpations: Abdomen is soft.     Tenderness: There is no abdominal tenderness.  Musculoskeletal:        General: Normal range of motion.  Cervical back: Normal range of motion.     Comments: No midline back tenderness Bilateral lumbar paraspinal muscle tenderness to palpation  Skin:    General: Skin is warm and dry.  Neurological:     General: No focal deficit present.     Mental Status: She is alert and oriented to person, place, and time.     Sensory: No sensory deficit.     Motor: No weakness (5 out of 5 strength in hip flexion, knee extension, plantar/dorsi flexion bilaterally).  Psychiatric:        Mood and Affect: Mood normal.        Behavior: Behavior normal.     (all labs ordered are listed, but only abnormal results are displayed) Labs Reviewed  RESP PANEL BY RT-PCR (RSV, FLU A&B, COVID)  RVPGX2 - Abnormal; Notable for the following components:      Result Value    Influenza A by PCR POSITIVE (*)    All other components within normal limits  CBC WITH DIFFERENTIAL/PLATELET - Abnormal; Notable for the following components:   RDW 18.1 (*)    Platelets 406 (*)    Lymphs Abs 0.6 (*)    Monocytes Absolute 1.3 (*)    All other components within normal limits  BASIC METABOLIC PANEL WITH GFR    EKG: EKG Interpretation Date/Time:  Friday June 12 2024 13:04:08 EST Ventricular Rate:  123 PR Interval:  128 QRS Duration:  61 QT Interval:  308 QTC Calculation: 441 R Axis:   71  Text Interpretation: Sinus tachycardia Probable left atrial enlargement Confirmed by Ellouise Fine ([id number]) on 06/12/2024 2:32:18 PM  Radiology: DG Chest 2 View Result Date: 06/12/2024 CLINICAL DATA:  Shortness of breath EXAM: CHEST - 2 VIEW COMPARISON:  06/04/2023 FINDINGS: Frontal and lateral views of the chest demonstrate an unremarkable cardiac silhouette. No acute airspace disease, effusion, or pneumothorax. No acute bony abnormalities. IMPRESSION: 1. No acute intrathoracic process. Electronically Signed   By: Ozell Daring M.D.   On: 06/12/2024 14:27     Procedures   Medications Ordered in the ED  cyclobenzaprine  (FLEXERIL ) tablet 10 mg (has no administration in time range)  traZODone  (DESYREL ) tablet 200 mg (has no administration in time range)  acetaminophen  (TYLENOL ) tablet 1,000 mg (1,000 mg Oral Given 06/12/24 1255)                                    Medical Decision Making This patient presents to the ED with chief complaint(s) of back pain with pertinent past medical history of schizoaffective, polysubstance use which further complicates the presenting complaint. The complaint involves an extensive differential diagnosis and also carries with it a high risk of complications and morbidity.    The differential diagnosis includes muscle strain or spasm, no significant trauma making fracture or dislocation unlikely, no neurologic deficits, loss of bowel or bladder  function making cauda equina unlikely  Additional history obtained: Additional history obtained from N/A Records reviewed [facility] records  ED Course and Reassessment: On patient's arrival she is febrile and tachycardic and otherwise hemodynamically stable in no acute distress.  She was initially evaluated in triage and had labs and viral swab, EKG and chest x-ray performed.  EKG showed sinus tachycardia and chest x-ray is without acute disease.  She is flu a positive and labs are otherwise within normal range.  Exam appears consistent with MSK pain.  She was given Tylenol   for her fever and pain and will be given additional Flexeril  and she request heat pack.  The patient also requested her home trazodone  and refill of her prescriptions and primary care follow-up.  Patient has no significant risk factors that require Tamiflu treatment at time and was recommended symptomatic treatment.  She was given strict return precautions.  Independent labs interpretation:  The following labs were independently interpreted: Flu a positive otherwise within normal range  Independent visualization of imaging: - I independently visualized the following imaging with scope of interpretation limited to determining acute life threatening conditions related to emergency care: Chest x-ray, which revealed no acute disease  Consultation: - Consulted or discussed management/test interpretation w/ external professional: N/A  Consideration for admission or further workup: Patient has no emergent conditions requiring admission or further work-up at this time and is stable for discharge home with primary care follow-up  Social Determinants of health: polysubstance use, no PCP    Risk Prescription drug management.       Final diagnoses:  Strain of lumbar region, initial encounter  Influenza A  Medication refill    ED Discharge Orders          Ordered    mirtazapine  (REMERON ) 15 MG tablet  Daily        06/12/24  1446    Multiple Vitamin (MULTIVITAMIN WITH MINERALS) TABS tablet  Daily        06/12/24 1446    nicotine  (NICODERM CQ  - DOSED IN MG/24 HOURS) 21 mg/24hr patch  Daily        06/12/24 1446    thiamine  (VITAMIN B-1) 100 MG tablet  Daily        06/12/24 1446    traZODone  (DESYREL ) 100 MG tablet  At bedtime PRN        06/12/24 1446    cyclobenzaprine  (FLEXERIL ) 10 MG tablet  2 times daily PRN        06/12/24 1446               Kingsley, Edyn Popoca K, DO 06/12/24 1447  "
